# Patient Record
Sex: Male | Born: 1945 | Race: White | Hispanic: No | Marital: Married | Smoking: Former smoker
Health system: Southern US, Community
[De-identification: ages and names within clinical notes are randomized; demographics above are authoritative.]

## PROBLEM LIST (undated history)

## (undated) DIAGNOSIS — M1990 Unspecified osteoarthritis, unspecified site: Secondary | ICD-10-CM

## (undated) DIAGNOSIS — I10 Essential (primary) hypertension: Secondary | ICD-10-CM

## (undated) DIAGNOSIS — J449 Chronic obstructive pulmonary disease, unspecified: Secondary | ICD-10-CM

---

## 1998-11-11 HISTORY — PX: KNEE ARTHROSCOPY: SUR90

## 2004-11-11 HISTORY — PX: KNEE ARTHROSCOPY: SUR90

## 2011-04-05 DIAGNOSIS — R072 Precordial pain: Secondary | ICD-10-CM

## 2012-01-09 DIAGNOSIS — I2510 Atherosclerotic heart disease of native coronary artery without angina pectoris: Secondary | ICD-10-CM | POA: Diagnosis not present

## 2012-01-09 DIAGNOSIS — F411 Generalized anxiety disorder: Secondary | ICD-10-CM | POA: Diagnosis not present

## 2012-01-09 DIAGNOSIS — I10 Essential (primary) hypertension: Secondary | ICD-10-CM | POA: Diagnosis not present

## 2012-04-07 DIAGNOSIS — I10 Essential (primary) hypertension: Secondary | ICD-10-CM | POA: Diagnosis not present

## 2012-07-07 DIAGNOSIS — L723 Sebaceous cyst: Secondary | ICD-10-CM | POA: Diagnosis not present

## 2012-10-07 DIAGNOSIS — I10 Essential (primary) hypertension: Secondary | ICD-10-CM | POA: Diagnosis not present

## 2012-10-07 DIAGNOSIS — Z13220 Encounter for screening for lipoid disorders: Secondary | ICD-10-CM | POA: Diagnosis not present

## 2013-01-07 DIAGNOSIS — I10 Essential (primary) hypertension: Secondary | ICD-10-CM | POA: Diagnosis not present

## 2013-04-06 DIAGNOSIS — I10 Essential (primary) hypertension: Secondary | ICD-10-CM | POA: Diagnosis not present

## 2013-04-06 DIAGNOSIS — Z125 Encounter for screening for malignant neoplasm of prostate: Secondary | ICD-10-CM | POA: Diagnosis not present

## 2013-04-16 DIAGNOSIS — Z7982 Long term (current) use of aspirin: Secondary | ICD-10-CM | POA: Diagnosis not present

## 2013-04-16 DIAGNOSIS — Z2821 Immunization not carried out because of patient refusal: Secondary | ICD-10-CM | POA: Diagnosis not present

## 2013-04-16 DIAGNOSIS — Z800 Family history of malignant neoplasm of digestive organs: Secondary | ICD-10-CM | POA: Diagnosis not present

## 2013-04-16 DIAGNOSIS — I2510 Atherosclerotic heart disease of native coronary artery without angina pectoris: Secondary | ICD-10-CM | POA: Diagnosis not present

## 2013-04-16 DIAGNOSIS — J36 Peritonsillar abscess: Secondary | ICD-10-CM | POA: Diagnosis not present

## 2013-04-16 DIAGNOSIS — F17200 Nicotine dependence, unspecified, uncomplicated: Secondary | ICD-10-CM | POA: Diagnosis not present

## 2013-04-16 DIAGNOSIS — Z801 Family history of malignant neoplasm of trachea, bronchus and lung: Secondary | ICD-10-CM | POA: Diagnosis not present

## 2013-04-16 DIAGNOSIS — Z79899 Other long term (current) drug therapy: Secondary | ICD-10-CM | POA: Diagnosis not present

## 2013-04-16 DIAGNOSIS — I10 Essential (primary) hypertension: Secondary | ICD-10-CM | POA: Diagnosis not present

## 2013-05-03 DIAGNOSIS — J36 Peritonsillar abscess: Secondary | ICD-10-CM | POA: Diagnosis not present

## 2013-05-03 DIAGNOSIS — I10 Essential (primary) hypertension: Secondary | ICD-10-CM | POA: Diagnosis not present

## 2013-05-26 DIAGNOSIS — H5210 Myopia, unspecified eye: Secondary | ICD-10-CM | POA: Diagnosis not present

## 2013-05-26 DIAGNOSIS — H52229 Regular astigmatism, unspecified eye: Secondary | ICD-10-CM | POA: Diagnosis not present

## 2013-05-26 DIAGNOSIS — H2510 Age-related nuclear cataract, unspecified eye: Secondary | ICD-10-CM | POA: Diagnosis not present

## 2013-07-23 DIAGNOSIS — Z0000 Encounter for general adult medical examination without abnormal findings: Secondary | ICD-10-CM | POA: Diagnosis not present

## 2013-07-23 DIAGNOSIS — I10 Essential (primary) hypertension: Secondary | ICD-10-CM | POA: Diagnosis not present

## 2013-10-26 DIAGNOSIS — Z131 Encounter for screening for diabetes mellitus: Secondary | ICD-10-CM | POA: Diagnosis not present

## 2013-10-26 DIAGNOSIS — I10 Essential (primary) hypertension: Secondary | ICD-10-CM | POA: Diagnosis not present

## 2013-10-28 DIAGNOSIS — Z23 Encounter for immunization: Secondary | ICD-10-CM | POA: Diagnosis not present

## 2013-12-08 DIAGNOSIS — H2510 Age-related nuclear cataract, unspecified eye: Secondary | ICD-10-CM | POA: Diagnosis not present

## 2014-02-09 DIAGNOSIS — I10 Essential (primary) hypertension: Secondary | ICD-10-CM | POA: Diagnosis not present

## 2014-05-17 DIAGNOSIS — I10 Essential (primary) hypertension: Secondary | ICD-10-CM | POA: Diagnosis not present

## 2014-08-16 DIAGNOSIS — I10 Essential (primary) hypertension: Secondary | ICD-10-CM | POA: Diagnosis not present

## 2014-08-16 DIAGNOSIS — Z0000 Encounter for general adult medical examination without abnormal findings: Secondary | ICD-10-CM | POA: Diagnosis not present

## 2014-08-16 DIAGNOSIS — Z1389 Encounter for screening for other disorder: Secondary | ICD-10-CM | POA: Diagnosis not present

## 2014-08-16 DIAGNOSIS — Z125 Encounter for screening for malignant neoplasm of prostate: Secondary | ICD-10-CM | POA: Diagnosis not present

## 2014-08-20 DIAGNOSIS — Z23 Encounter for immunization: Secondary | ICD-10-CM | POA: Diagnosis not present

## 2014-12-19 DIAGNOSIS — I10 Essential (primary) hypertension: Secondary | ICD-10-CM | POA: Diagnosis not present

## 2014-12-19 DIAGNOSIS — Z13220 Encounter for screening for lipoid disorders: Secondary | ICD-10-CM | POA: Diagnosis not present

## 2015-03-20 DIAGNOSIS — I10 Essential (primary) hypertension: Secondary | ICD-10-CM | POA: Diagnosis not present

## 2015-03-30 DIAGNOSIS — H524 Presbyopia: Secondary | ICD-10-CM | POA: Diagnosis not present

## 2015-03-30 DIAGNOSIS — H52223 Regular astigmatism, bilateral: Secondary | ICD-10-CM | POA: Diagnosis not present

## 2015-03-30 DIAGNOSIS — H2510 Age-related nuclear cataract, unspecified eye: Secondary | ICD-10-CM | POA: Diagnosis not present

## 2015-03-30 DIAGNOSIS — H5213 Myopia, bilateral: Secondary | ICD-10-CM | POA: Diagnosis not present

## 2015-06-12 DIAGNOSIS — H2512 Age-related nuclear cataract, left eye: Secondary | ICD-10-CM | POA: Diagnosis not present

## 2015-06-12 DIAGNOSIS — H2513 Age-related nuclear cataract, bilateral: Secondary | ICD-10-CM | POA: Diagnosis not present

## 2015-06-20 DIAGNOSIS — I10 Essential (primary) hypertension: Secondary | ICD-10-CM | POA: Diagnosis not present

## 2015-06-21 DIAGNOSIS — I10 Essential (primary) hypertension: Secondary | ICD-10-CM | POA: Diagnosis not present

## 2015-06-27 NOTE — Patient Instructions (Signed)
David Mack  06/27/2015     @   Your procedure is scheduled on 07/06/2015  Report to [facility] at 7:00 A.M.  Call this number if you have problems the morning of surgery:  [phone]   Remember:  Do not eat food or drink liquids after midnight.  Take these medicines the morning of surgery with A SIP OF WATER Imdur, Amlodipine   Do not wear jewelry, make-up or nail polish.  Do not wear lotions, powders, or perfumes.  You may wear deodorant.  Do not shave 48 hours prior to surgery.  Men may shave face and neck.  Do not bring valuables to the hospital.  [facility] is not responsible for any belongings or valuables.  Contacts, dentures or bridgework may not be worn into surgery.  Leave your suitcase in the car.  After surgery it may be brought to your room.  For patients admitted to the hospital, discharge time will be determined by your treatment team.  Patients discharged the day of surgery will not be allowed to drive home.   Please read over the following fact sheets that you were given. Anesthesia Post-op Instructions      PATIENT INSTRUCTIONS POST-ANESTHESIA  IMMEDIATELY FOLLOWING SURGERY:  Do not drive or operate machinery for the first twenty four hours after surgery.  Do not make any important decisions for twenty four hours after surgery or while taking narcotic pain medications or sedatives.  If you develop intractable nausea and vomiting or a severe headache please notify your doctor immediately.  FOLLOW-UP:  Please make an appointment with your surgeon as instructed. You do not need to follow up with anesthesia unless specifically instructed to do so.  WOUND CARE INSTRUCTIONS (if applicable):  Keep a dry clean dressing on the anesthesia/puncture wound site if there is drainage.  Once the wound has quit draining you may leave it open to air.  Generally you should leave the bandage intact for twenty four hours unless there is drainage.  If the  epidural site drains for more than 36-48 hours please call the anesthesia department.  QUESTIONS?:  Please feel free to call your physician or the hospital operator if you have any questions, and they will be happy to assist you.      Cataract Surgery  A cataract is a clouding of the lens of the eye. When a lens becomes cloudy, vision is reduced based on the degree and nature of the clouding. Surgery may be needed to improve vision. Surgery removes the cloudy lens and usually replaces it with a substitute lens (intraocular lens, IOL). LET YOUR EYE DOCTOR KNOW ABOUT:  Allergies to food or medicine.  Medicines taken including herbs, eye drops, over-the-counter medicines, and creams.  Use of steroids (by mouth or creams).  Previous problems with anesthetics or numbing medicine.  History of bleeding problems or blood clots.  Previous surgery.  Other health problems, including diabetes and kidney problems.  Possibility of pregnancy, if this applies. RISKS AND COMPLICATIONS  Infection.  Inflammation of the eyeball (endophthalmitis) that can spread to both eyes (sympathetic ophthalmia).  Poor wound healing.  If an IOL is inserted, it can later fall out of proper position. This is very uncommon.  Clouding of the part of your eye that holds an IOL in place. This is called an "after-cataract." These are uncommon but easily treated. BEFORE THE PROCEDURE  Do not eat or drink anything except small amounts of water for 8 to 12 before  your surgery, or as directed by your caregiver.  Unless you are told otherwise, continue any eye drops you have been prescribed.  Talk to your primary caregiver about all other medicines that you take (both prescription and nonprescription). In some cases, you may need to stop or change medicines near the time of your surgery. This is most important if you are taking blood-thinning medicine.Do not stop medicines unless you are told to do so.  Arrange for  someone to drive you to and from the procedure.  Do not put contact lenses in either eye on the day of your surgery. PROCEDURE There is more than one method for safely removing a cataract. Your doctor can explain the differences and help determine which is best for you. Phacoemulsification surgery is the most common form of cataract surgery.  An injection is given behind the eye or eye drops are given to make this a painless procedure.  A small cut (incision) is made on the edge of the clear, dome-shaped surface that covers the front of the eye (cornea).  A tiny probe is painlessly inserted into the eye. This device gives off ultrasound waves that soften and break up the cloudy center of the lens. This makes it easier for the cloudy lens to be removed by suction.  An IOL may be implanted.  The normal lens of the eye is covered by a clear capsule. Part of that capsule is intentionally left in the eye to support the IOL.  Your surgeon may or may not use stitches to close the incision. There are other forms of cataract surgery that require a larger incision and stitches to close the eye. This approach is taken in cases where the doctor feels that the cataract cannot be easily removed using phacoemulsification. AFTER THE PROCEDURE  When an IOL is implanted, it does not need care. It becomes a permanent part of your eye and cannot be seen or felt.  Your doctor will schedule follow-up exams to check on your progress.  Review your other medicines with your doctor to see which can be resumed after surgery.  Use eye drops or take medicine as prescribed by your doctor. Document Released: 10/17/2011 Document Revised: 03/14/2014 Document Reviewed: 10/17/2011 [facility] Patient Information 2015 [place], [place]. This information is not intended to replace advice given to you by your health care provider. Make sure you discuss any questions you have with your health care provider.

## 2015-06-28 ENCOUNTER — Encounter (HOSPITAL_COMMUNITY)
Admission: RE | Admit: 2015-06-28 | Discharge: 2015-06-28 | Disposition: A | Discharge status: Home / Self Care | Payer: Medicare Other | Source: Ambulatory Visit | Attending: Ophthalmology | Admitting: Ophthalmology

## 2015-06-28 ENCOUNTER — Encounter (HOSPITAL_COMMUNITY): Payer: Self-pay

## 2015-06-28 ENCOUNTER — Other Ambulatory Visit: Payer: Self-pay

## 2015-06-28 DIAGNOSIS — H2512 Age-related nuclear cataract, left eye: Secondary | ICD-10-CM | POA: Insufficient documentation

## 2015-06-28 DIAGNOSIS — Z01818 Encounter for other preprocedural examination: Secondary | ICD-10-CM | POA: Diagnosis not present

## 2015-06-28 HISTORY — DX: Essential (primary) hypertension: I10

## 2015-06-28 LAB — CBC
HEMATOCRIT: 39.7 % (ref 39.0–52.0)
Hemoglobin: 14 g/dL (ref 13.0–17.0)
MCH: 33.7 pg (ref 26.0–34.0)
MCHC: 35.3 g/dL (ref 30.0–36.0)
MCV: 95.4 fL (ref 78.0–100.0)
PLATELETS: 249 10*3/uL (ref 150–400)
RBC: 4.16 MIL/uL — ABNORMAL LOW (ref 4.22–5.81)
RDW: 13 % (ref 11.5–15.5)
WBC: 6.3 10*3/uL (ref 4.0–10.5)

## 2015-06-28 LAB — BASIC METABOLIC PANEL
Anion gap: 9 (ref 5–15)
BUN: 7 mg/dL (ref 6–20)
CALCIUM: 9.3 mg/dL (ref 8.9–10.3)
CO2: 26 mmol/L (ref 22–32)
CREATININE: 0.68 mg/dL (ref 0.61–1.24)
Chloride: 97 mmol/L — ABNORMAL LOW (ref 101–111)
GLUCOSE: 100 mg/dL — AB (ref 65–99)
Potassium: 5.2 mmol/L — ABNORMAL HIGH (ref 3.5–5.1)
Sodium: 132 mmol/L — ABNORMAL LOW (ref 135–145)

## 2015-07-05 MED ORDER — CYCLOPENTOLATE-PHENYLEPHRINE OP SOLN OPTIME - NO CHARGE
OPHTHALMIC | Status: AC
  Filled 2015-07-05: qty 2

## 2015-07-05 MED ORDER — TETRACAINE HCL 0.5 % OP SOLN
OPHTHALMIC | Status: AC
  Filled 2015-07-05: qty 2

## 2015-07-05 MED ORDER — LIDOCAINE HCL (PF) 1 % IJ SOLN
INTRAMUSCULAR | Status: AC
  Filled 2015-07-05: qty 2

## 2015-07-05 MED ORDER — LIDOCAINE HCL 3.5 % OP GEL
OPHTHALMIC | Status: AC
  Filled 2015-07-05: qty 1

## 2015-07-05 MED ORDER — NEOMYCIN-POLYMYXIN-DEXAMETH 3.5-10000-0.1 OP SUSP
OPHTHALMIC | Status: AC
  Filled 2015-07-05: qty 5

## 2015-07-05 MED ORDER — PHENYLEPHRINE HCL 2.5 % OP SOLN
OPHTHALMIC | Status: AC
  Filled 2015-07-05: qty 15

## 2015-07-06 ENCOUNTER — Ambulatory Visit (HOSPITAL_COMMUNITY): Payer: Medicare Other | Admitting: Anesthesiology

## 2015-07-06 ENCOUNTER — Ambulatory Visit (HOSPITAL_COMMUNITY)
Admission: RE | Admit: 2015-07-06 | Discharge: 2015-07-06 | Disposition: A | Discharge status: Home / Self Care | Payer: Medicare Other | Source: Ambulatory Visit | Attending: Ophthalmology | Admitting: Ophthalmology

## 2015-07-06 ENCOUNTER — Encounter (HOSPITAL_COMMUNITY)
Admission: RE | Disposition: A | Discharge status: Home / Self Care | Payer: Self-pay | Source: Ambulatory Visit | Attending: Ophthalmology

## 2015-07-06 ENCOUNTER — Encounter (HOSPITAL_COMMUNITY): Payer: Self-pay | Admitting: Ophthalmology

## 2015-07-06 DIAGNOSIS — H269 Unspecified cataract: Secondary | ICD-10-CM | POA: Diagnosis not present

## 2015-07-06 DIAGNOSIS — Z7982 Long term (current) use of aspirin: Secondary | ICD-10-CM | POA: Insufficient documentation

## 2015-07-06 DIAGNOSIS — F17200 Nicotine dependence, unspecified, uncomplicated: Secondary | ICD-10-CM | POA: Diagnosis not present

## 2015-07-06 DIAGNOSIS — H25812 Combined forms of age-related cataract, left eye: Secondary | ICD-10-CM | POA: Diagnosis not present

## 2015-07-06 DIAGNOSIS — H2512 Age-related nuclear cataract, left eye: Secondary | ICD-10-CM | POA: Insufficient documentation

## 2015-07-06 DIAGNOSIS — I10 Essential (primary) hypertension: Secondary | ICD-10-CM | POA: Diagnosis not present

## 2015-07-06 DIAGNOSIS — Z79899 Other long term (current) drug therapy: Secondary | ICD-10-CM | POA: Insufficient documentation

## 2015-07-06 HISTORY — PX: CATARACT EXTRACTION W/PHACO: SHX586

## 2015-07-06 SURGERY — PHACOEMULSIFICATION, CATARACT, WITH IOL INSERTION
Anesthesia: Monitor Anesthesia Care | Site: Eye | Laterality: Left

## 2015-07-06 MED ORDER — FENTANYL CITRATE (PF) 100 MCG/2ML IJ SOLN
25.0000 ug | INTRAMUSCULAR | Status: AC
  Administered 2015-07-06: 100 ug via INTRAVENOUS
  Administered 2015-07-06: 25 ug via INTRAVENOUS

## 2015-07-06 MED ORDER — LIDOCAINE HCL 3.5 % OP GEL
1.0000 "application " | OPHTHALMIC | Status: AC
  Administered 2015-07-06: 1 via OPHTHALMIC

## 2015-07-06 MED ORDER — PROVISC 10 MG/ML IO SOLN
INTRAOCULAR | Status: DC | PRN
  Administered 2015-07-06: 0.85 mL via INTRAOCULAR

## 2015-07-06 MED ORDER — BSS IO SOLN
INTRAOCULAR | Status: DC | PRN
  Administered 2015-07-06: 15 mL

## 2015-07-06 MED ORDER — POVIDONE-IODINE 5 % OP SOLN
OPHTHALMIC | Status: DC | PRN
  Administered 2015-07-06: 1 via OPHTHALMIC

## 2015-07-06 MED ORDER — TETRACAINE HCL 0.5 % OP SOLN
1.0000 [drp] | OPHTHALMIC | Status: AC
  Administered 2015-07-06 (×3): 1 [drp] via OPHTHALMIC

## 2015-07-06 MED ORDER — LIDOCAINE HCL (PF) 1 % IJ SOLN
INTRAMUSCULAR | Status: DC | PRN
  Administered 2015-07-06: .8 mL

## 2015-07-06 MED ORDER — MIDAZOLAM HCL 2 MG/2ML IJ SOLN
INTRAMUSCULAR | Status: DC | PRN
  Administered 2015-07-06: 2 mg via INTRAVENOUS

## 2015-07-06 MED ORDER — LIDOCAINE 3.5 % OP GEL OPTIME - NO CHARGE
OPHTHALMIC | Status: DC | PRN
  Administered 2015-07-06: 1 [drp] via OPHTHALMIC

## 2015-07-06 MED ORDER — LACTATED RINGERS IV SOLN
INTRAVENOUS | Status: DC
  Administered 2015-07-06: 08:00:00 via INTRAVENOUS

## 2015-07-06 MED ORDER — EPINEPHRINE HCL 1 MG/ML IJ SOLN
INTRAOCULAR | Status: DC | PRN
  Administered 2015-07-06: 500 mL

## 2015-07-06 MED ORDER — NEOMYCIN-POLYMYXIN-DEXAMETH 3.5-10000-0.1 OP SUSP
OPHTHALMIC | Status: DC | PRN
  Administered 2015-07-06: 2 [drp] via OPHTHALMIC

## 2015-07-06 MED ORDER — PHENYLEPHRINE HCL 2.5 % OP SOLN
1.0000 [drp] | OPHTHALMIC | Status: AC
  Administered 2015-07-06 (×3): 1 [drp] via OPHTHALMIC

## 2015-07-06 MED ORDER — MIDAZOLAM HCL 2 MG/2ML IJ SOLN
INTRAMUSCULAR | Status: AC
  Filled 2015-07-06: qty 2

## 2015-07-06 MED ORDER — MIDAZOLAM HCL 2 MG/2ML IJ SOLN
1.0000 mg | INTRAMUSCULAR | Status: DC | PRN
  Administered 2015-07-06 (×2): 2 mg via INTRAVENOUS
  Filled 2015-07-06: qty 2

## 2015-07-06 MED ORDER — MIDAZOLAM HCL 2 MG/2ML IJ SOLN
INTRAMUSCULAR | Status: AC
  Filled 2015-07-06: qty 4

## 2015-07-06 MED ORDER — EPINEPHRINE HCL 1 MG/ML IJ SOLN
INTRAMUSCULAR | Status: AC
  Filled 2015-07-06: qty 1

## 2015-07-06 MED ORDER — CYCLOPENTOLATE-PHENYLEPHRINE 0.2-1 % OP SOLN
1.0000 [drp] | OPHTHALMIC | Status: AC
  Administered 2015-07-06 (×3): 1 [drp] via OPHTHALMIC

## 2015-07-06 MED ORDER — FENTANYL CITRATE (PF) 100 MCG/2ML IJ SOLN
INTRAMUSCULAR | Status: AC
  Filled 2015-07-06: qty 2

## 2015-07-06 SURGICAL SUPPLY — 34 items
CAPSULAR TENSION RING-AMO (OPHTHALMIC RELATED) IMPLANT
CLOTH BEACON ORANGE TIMEOUT ST (SAFETY) IMPLANT
EYE SHIELD UNIVERSAL CLEAR (GAUZE/BANDAGES/DRESSINGS) ×3 IMPLANT
GLOVE BIO SURGEON STRL SZ 6.5 (GLOVE) IMPLANT
GLOVE BIO SURGEONS STRL SZ 6.5 (GLOVE)
GLOVE BIOGEL PI IND STRL 6.5 (GLOVE) IMPLANT
GLOVE BIOGEL PI IND STRL 7.0 (GLOVE) ×1 IMPLANT
GLOVE BIOGEL PI IND STRL 7.5 (GLOVE) ×1 IMPLANT
GLOVE BIOGEL PI INDICATOR 6.5 (GLOVE)
GLOVE BIOGEL PI INDICATOR 7.0 (GLOVE) ×2
GLOVE BIOGEL PI INDICATOR 7.5 (GLOVE) ×2
GLOVE ECLIPSE 6.5 STRL STRAW (GLOVE) IMPLANT
GLOVE ECLIPSE 7.0 STRL STRAW (GLOVE) IMPLANT
GLOVE ECLIPSE 7.5 STRL STRAW (GLOVE) IMPLANT
GLOVE EXAM NITRILE LRG STRL (GLOVE) IMPLANT
GLOVE EXAM NITRILE MD LF STRL (GLOVE) IMPLANT
GLOVE SKINSENSE NS SZ6.5 (GLOVE)
GLOVE SKINSENSE NS SZ7.0 (GLOVE)
GLOVE SKINSENSE STRL SZ6.5 (GLOVE) IMPLANT
GLOVE SKINSENSE STRL SZ7.0 (GLOVE) IMPLANT
KIT VITRECTOMY (OPHTHALMIC RELATED) IMPLANT
PAD ARMBOARD 7.5X6 YLW CONV (MISCELLANEOUS) IMPLANT
PROC W NO LENS (INTRAOCULAR LENS)
PROC W SPEC LENS (INTRAOCULAR LENS)
PROCESS W NO LENS (INTRAOCULAR LENS) IMPLANT
PROCESS W SPEC LENS (INTRAOCULAR LENS) IMPLANT
RETRACTOR IRIS SIGHTPATH (OPHTHALMIC RELATED) IMPLANT
RING MALYGIN (MISCELLANEOUS) IMPLANT
SIGHTPATH CAT PROC W REG LENS (Ophthalmic Related) ×3 IMPLANT
SYRINGE LUER LOK 1CC (MISCELLANEOUS) ×3 IMPLANT
TAPE SURG TRANSPORE 1 IN (GAUZE/BANDAGES/DRESSINGS) ×1 IMPLANT
TAPE SURGICAL TRANSPORE 1 IN (GAUZE/BANDAGES/DRESSINGS) ×2
VISCOELASTIC ADDITIONAL (OPHTHALMIC RELATED) IMPLANT
WATER STERILE IRR 250ML POUR (IV SOLUTION) ×3 IMPLANT

## 2015-07-06 NOTE — H&P (Signed)
I have reviewed the H&P, the patient was re-examined, and I have identified no interval changes in medical condition and plan of care since the history and physical of record  

## 2015-07-06 NOTE — Transfer of Care (Signed)
Immediate Anesthesia Transfer of Care Note  Patient: David Mack  Procedure(s) Performed: Procedure(s) with comments: CATARACT EXTRACTION PHACO AND INTRAOCULAR LENS PLACEMENT (IOC) (Left) - CDE 8.33  Patient Location: Short Stay  Anesthesia Type:MAC  Level of Consciousness: awake  Airway & Oxygen Therapy: Patient Spontanous Breathing  Post-op Assessment: Report given to RN  Post vital signs: Reviewed  Last Vitals:  Filed Vitals:   07/06/15 0900  BP: 115/63  Resp: 37    Complications: No apparent anesthesia complications

## 2015-07-06 NOTE — Op Note (Signed)
Date of Admission: 07/06/2015  Date of Surgery: 07/06/2015   Pre-Op Dx: Cataract Left Eye  Post-Op Dx: Senile Nuclear Cataract Left  Eye,  Dx Code H25.12  Surgeon: Gemma Payor, M.D.  Assistants: None  Anesthesia: Topical with MAC  Indications: Painless, progressive loss of vision with compromise of daily activities.  Surgery: Cataract Extraction with Intraocular lens Implant Left Eye  Discription: The patient had dilating drops and viscous lidocaine placed into the Left eye in the pre-op holding area. After transfer to the operating room, a time out was performed. The patient was then prepped and draped. Beginning with a 75 degree blade a paracentesis port was made at the surgeon's 2 o'clock position. The anterior chamber was then filled with 1% non-preserved lidocaine. This was followed by filling the anterior chamber with Provisc.  A 2.54mm keratome blade was used to make a clear corneal incision at the temporal limbus.  A bent cystatome needle was used to create a continuous tear capsulotomy. Hydrodissection was performed with balanced salt solution on a Fine canula. The lens nucleus was then removed using the phacoemulsification handpiece. Residual cortex was removed with the I&A handpiece. The anterior chamber and capsular bag were refilled with Provisc. A posterior chamber intraocular lens was placed into the capsular bag with it's injector. The implant was positioned with the Kuglan hook. The Provisc was then removed from the anterior chamber and capsular bag with the I&A handpiece. Stromal hydration of the main incision and paracentesis port was performed with BSS on a Fine canula. The wounds were tested for leak which was negative. The patient tolerated the procedure well. There were no operative complications. The patient was then transferred to the recovery room in stable condition.  Complications: None  Specimen: None  EBL: None  Prosthetic device: Hoya iSert 250, power 20.5 D, SN  [id number].

## 2015-07-06 NOTE — Anesthesia Postprocedure Evaluation (Signed)
  Anesthesia Post-op Note  Patient: David Mack  Procedure(s) Performed: Procedure(s) with comments: CATARACT EXTRACTION PHACO AND INTRAOCULAR LENS PLACEMENT (IOC) (Left) - CDE 8.33  Patient Location: Short Stay  Anesthesia Type:MAC  Level of Consciousness: awake, alert  and oriented  Airway and Oxygen Therapy: Patient Spontanous Breathing  Post-op Pain: none  Post-op Assessment: Post-op Vital signs reviewed, Patient's Cardiovascular Status Stable, Respiratory Function Stable, Patent Airway and No signs of Nausea or vomiting              Post-op Vital Signs: Reviewed and stable  Last Vitals:  Filed Vitals:   07/06/15 0900  BP: 115/63  Resp: 37    Complications: No apparent anesthesia complications

## 2015-07-06 NOTE — Discharge Instructions (Signed)

## 2015-07-06 NOTE — Anesthesia Preprocedure Evaluation (Signed)
Anesthesia Evaluation  Patient identified by MRN, date of birth, ID band Patient awake    Reviewed: Allergy & Precautions, NPO status , Patient's Chart, lab work & pertinent test results  Airway Mallampati: II  TM Distance: >3 FB     Dental  (+) Teeth Intact   Pulmonary Current Smoker,    breath sounds clear to auscultation       Cardiovascular hypertension, Pt. on medications  Rhythm:Regular Rate:Normal     Neuro/Psych    GI/Hepatic negative GI ROS,   Endo/Other    Renal/GU      Musculoskeletal   Abdominal   Peds  Hematology   Anesthesia Other Findings   Reproductive/Obstetrics                             Anesthesia Physical Anesthesia Plan  ASA: II  Anesthesia Plan: MAC   Post-op Pain Management:    Induction: Intravenous  Airway Management Planned: Nasal Cannula  Additional Equipment:   Intra-op Plan:   Post-operative Plan:   Informed Consent: I have reviewed the patients History and Physical, chart, labs and discussed the procedure including the risks, benefits and alternatives for the proposed anesthesia with the patient or authorized representative who has indicated his/her understanding and acceptance.     Plan Discussed with:   Anesthesia Plan Comments:         Anesthesia Quick Evaluation  

## 2015-07-07 ENCOUNTER — Encounter (HOSPITAL_COMMUNITY): Payer: Self-pay | Admitting: Ophthalmology

## 2015-08-14 DIAGNOSIS — H2511 Age-related nuclear cataract, right eye: Secondary | ICD-10-CM | POA: Diagnosis not present

## 2015-08-15 NOTE — Patient Instructions (Signed)
DEVARIOUS PAVEK  08/15/2015     @   Your procedure is scheduled on 08/21/2015.  Report to [facility] at 11:30 A.M.  Call this number if you have problems the morning of surgery:  [phone]   Remember:  Do not eat food or drink liquids after midnight.  Take these medicines the morning of surgery with A SIP OF WATER Amlodipine, Imdur   Do not wear jewelry, make-up or nail polish.  Do not wear lotions, powders, or perfumes.  You may wear deodorant.  Do not shave 48 hours prior to surgery.  Men may shave face and neck.  Do not bring valuables to the hospital.  [facility] is not responsible for any belongings or valuables.  Contacts, dentures or bridgework may not be worn into surgery.  Leave your suitcase in the car.  After surgery it may be brought to your room.  For patients admitted to the hospital, discharge time will be determined by your treatment team.  Patients discharged the day of surgery will not be allowed to drive home.    Please read over the following fact sheets that you were given. Anesthesia Post-op Instructions     PATIENT INSTRUCTIONS POST-ANESTHESIA  IMMEDIATELY FOLLOWING SURGERY:  Do not drive or operate machinery for the first twenty four hours after surgery.  Do not make any important decisions for twenty four hours after surgery or while taking narcotic pain medications or sedatives.  If you develop intractable nausea and vomiting or a severe headache please notify your doctor immediately.  FOLLOW-UP:  Please make an appointment with your surgeon as instructed. You do not need to follow up with anesthesia unless specifically instructed to do so.  WOUND CARE INSTRUCTIONS (if applicable):  Keep a dry clean dressing on the anesthesia/puncture wound site if there is drainage.  Once the wound has quit draining you may leave it open to air.  Generally you should leave the bandage intact for twenty four hours unless there is drainage.  If  the epidural site drains for more than 36-48 hours please call the anesthesia department.  QUESTIONS?:  Please feel free to call your physician or the hospital operator if you have any questions, and they will be happy to assist you.      Cataract Surgery  A cataract is a clouding of the lens of the eye. When a lens becomes cloudy, vision is reduced based on the degree and nature of the clouding. Surgery may be needed to improve vision. Surgery removes the cloudy lens and usually replaces it with a substitute lens (intraocular lens, IOL). LET YOUR EYE DOCTOR KNOW ABOUT:  Allergies to food or medicine.  Medicines taken including herbs, eye drops, over-the-counter medicines, and creams.  Use of steroids (by mouth or creams).  Previous problems with anesthetics or numbing medicine.  History of bleeding problems or blood clots.  Previous surgery.  Other health problems, including diabetes and kidney problems.  Possibility of pregnancy, if this applies. RISKS AND COMPLICATIONS  Infection.  Inflammation of the eyeball (endophthalmitis) that can spread to both eyes (sympathetic ophthalmia).  Poor wound healing.  If an IOL is inserted, it can later fall out of proper position. This is very uncommon.  Clouding of the part of your eye that holds an IOL in place. This is called an "after-cataract." These are uncommon but easily treated. BEFORE THE PROCEDURE  Do not eat or drink anything except small amounts of water for 8 to 12 before  your surgery, or as directed by your caregiver.  Unless you are told otherwise, continue any eye drops you have been prescribed.  Talk to your primary caregiver about all other medicines that you take (both prescription and nonprescription). In some cases, you may need to stop or change medicines near the time of your surgery. This is most important if you are taking blood-thinning medicine.Do not stop medicines unless you are told to do so.  Arrange  for someone to drive you to and from the procedure.  Do not put contact lenses in either eye on the day of your surgery. PROCEDURE There is more than one method for safely removing a cataract. Your doctor can explain the differences and help determine which is best for you. Phacoemulsification surgery is the most common form of cataract surgery.  An injection is given behind the eye or eye drops are given to make this a painless procedure.  A small cut (incision) is made on the edge of the clear, dome-shaped surface that covers the front of the eye (cornea).  A tiny probe is painlessly inserted into the eye. This device gives off ultrasound waves that soften and break up the cloudy center of the lens. This makes it easier for the cloudy lens to be removed by suction.  An IOL may be implanted.  The normal lens of the eye is covered by a clear capsule. Part of that capsule is intentionally left in the eye to support the IOL.  Your surgeon may or may not use stitches to close the incision. There are other forms of cataract surgery that require a larger incision and stitches to close the eye. This approach is taken in cases where the doctor feels that the cataract cannot be easily removed using phacoemulsification. AFTER THE PROCEDURE  When an IOL is implanted, it does not need care. It becomes a permanent part of your eye and cannot be seen or felt.  Your doctor will schedule follow-up exams to check on your progress.  Review your other medicines with your doctor to see which can be resumed after surgery.  Use eye drops or take medicine as prescribed by your doctor. Document Released: 10/17/2011 Document Revised: 03/14/2014 Document Reviewed: 10/17/2011 [facility] Patient Information 2015 [place], [place]. This information is not intended to replace advice given to you by your health care provider. Make sure you discuss any questions you have with your health care provider.

## 2015-08-17 ENCOUNTER — Encounter (HOSPITAL_COMMUNITY): Payer: Self-pay

## 2015-08-17 ENCOUNTER — Encounter (HOSPITAL_COMMUNITY)
Admission: RE | Admit: 2015-08-17 | Discharge: 2015-08-17 | Disposition: A | Discharge status: Home / Self Care | Payer: Medicare Other | Source: Ambulatory Visit | Attending: Ophthalmology | Admitting: Ophthalmology

## 2015-08-18 MED ORDER — PHENYLEPHRINE HCL 2.5 % OP SOLN
OPHTHALMIC | Status: AC
  Filled 2015-08-18: qty 15

## 2015-08-18 MED ORDER — TETRACAINE HCL 0.5 % OP SOLN
OPHTHALMIC | Status: AC
  Filled 2015-08-18: qty 2

## 2015-08-18 MED ORDER — LIDOCAINE HCL 3.5 % OP GEL
OPHTHALMIC | Status: AC
  Filled 2015-08-18: qty 1

## 2015-08-18 MED ORDER — CYCLOPENTOLATE-PHENYLEPHRINE OP SOLN OPTIME - NO CHARGE
OPHTHALMIC | Status: AC
  Filled 2015-08-18: qty 2

## 2015-08-18 MED ORDER — NEOMYCIN-POLYMYXIN-DEXAMETH 3.5-10000-0.1 OP SUSP
OPHTHALMIC | Status: AC
  Filled 2015-08-18: qty 5

## 2015-08-18 MED ORDER — LIDOCAINE HCL (PF) 1 % IJ SOLN
INTRAMUSCULAR | Status: AC
  Filled 2015-08-18: qty 2

## 2015-08-21 ENCOUNTER — Encounter (HOSPITAL_COMMUNITY): Payer: Self-pay | Admitting: *Deleted

## 2015-08-21 ENCOUNTER — Encounter (HOSPITAL_COMMUNITY)
Admission: RE | Disposition: A | Discharge status: Home / Self Care | Payer: Self-pay | Source: Ambulatory Visit | Attending: Ophthalmology

## 2015-08-21 ENCOUNTER — Ambulatory Visit (HOSPITAL_COMMUNITY): Payer: Medicare Other | Admitting: Anesthesiology

## 2015-08-21 ENCOUNTER — Ambulatory Visit (HOSPITAL_COMMUNITY)
Admission: RE | Admit: 2015-08-21 | Discharge: 2015-08-21 | Disposition: A | Discharge status: Home / Self Care | Payer: Medicare Other | Source: Ambulatory Visit | Attending: Ophthalmology | Admitting: Ophthalmology

## 2015-08-21 DIAGNOSIS — Z79899 Other long term (current) drug therapy: Secondary | ICD-10-CM | POA: Insufficient documentation

## 2015-08-21 DIAGNOSIS — Z7982 Long term (current) use of aspirin: Secondary | ICD-10-CM | POA: Diagnosis not present

## 2015-08-21 DIAGNOSIS — I10 Essential (primary) hypertension: Secondary | ICD-10-CM | POA: Diagnosis not present

## 2015-08-21 DIAGNOSIS — F17200 Nicotine dependence, unspecified, uncomplicated: Secondary | ICD-10-CM | POA: Diagnosis not present

## 2015-08-21 DIAGNOSIS — H2511 Age-related nuclear cataract, right eye: Secondary | ICD-10-CM | POA: Insufficient documentation

## 2015-08-21 DIAGNOSIS — H269 Unspecified cataract: Secondary | ICD-10-CM | POA: Diagnosis not present

## 2015-08-21 HISTORY — PX: CATARACT EXTRACTION W/PHACO: SHX586

## 2015-08-21 SURGERY — PHACOEMULSIFICATION, CATARACT, WITH IOL INSERTION
Anesthesia: Monitor Anesthesia Care | Site: Eye | Laterality: Right

## 2015-08-21 MED ORDER — TETRACAINE HCL 0.5 % OP SOLN
1.0000 [drp] | OPHTHALMIC | Status: AC
  Administered 2015-08-21 (×3): 1 [drp] via OPHTHALMIC

## 2015-08-21 MED ORDER — PHENYLEPHRINE HCL 2.5 % OP SOLN
1.0000 [drp] | OPHTHALMIC | Status: AC
  Administered 2015-08-21 (×3): 1 [drp] via OPHTHALMIC

## 2015-08-21 MED ORDER — EPINEPHRINE HCL 1 MG/ML IJ SOLN
INTRAMUSCULAR | Status: AC
  Filled 2015-08-21: qty 1

## 2015-08-21 MED ORDER — CYCLOPENTOLATE-PHENYLEPHRINE 0.2-1 % OP SOLN
1.0000 [drp] | OPHTHALMIC | Status: AC
  Administered 2015-08-21 (×3): 1 [drp] via OPHTHALMIC

## 2015-08-21 MED ORDER — POVIDONE-IODINE 5 % OP SOLN
OPHTHALMIC | Status: DC | PRN
  Administered 2015-08-21: 1 via OPHTHALMIC

## 2015-08-21 MED ORDER — ONDANSETRON HCL 4 MG/2ML IJ SOLN: 4.0000 mg | Freq: Once | INTRAMUSCULAR | Status: DC | PRN

## 2015-08-21 MED ORDER — LIDOCAINE 3.5 % OP GEL OPTIME - NO CHARGE
OPHTHALMIC | Status: DC | PRN
  Administered 2015-08-21: 1 [drp] via OPHTHALMIC

## 2015-08-21 MED ORDER — NEOMYCIN-POLYMYXIN-DEXAMETH 3.5-10000-0.1 OP SUSP
OPHTHALMIC | Status: DC | PRN
Start: 2015-08-21 — End: 2015-08-21
  Administered 2015-08-21: 1 [drp] via OPHTHALMIC

## 2015-08-21 MED ORDER — PROVISC 10 MG/ML IO SOLN
INTRAOCULAR | Status: DC | PRN
Start: 2015-08-21 — End: 2015-08-21
  Administered 2015-08-21: 0.85 mL via INTRAOCULAR

## 2015-08-21 MED ORDER — LACTATED RINGERS IV SOLN
INTRAVENOUS | Status: DC
  Administered 2015-08-21: 1000 mL via INTRAVENOUS

## 2015-08-21 MED ORDER — LIDOCAINE HCL (PF) 1 % IJ SOLN
INTRAMUSCULAR | Status: DC | PRN
  Administered 2015-08-21: .5 mL

## 2015-08-21 MED ORDER — LIDOCAINE HCL 3.5 % OP GEL
1.0000 "application " | Freq: Once | OPHTHALMIC | Status: AC
  Administered 2015-08-21: 1 via OPHTHALMIC

## 2015-08-21 MED ORDER — FENTANYL CITRATE (PF) 100 MCG/2ML IJ SOLN
25.0000 ug | INTRAMUSCULAR | Status: AC
  Administered 2015-08-21 (×2): 25 ug via INTRAVENOUS

## 2015-08-21 MED ORDER — FENTANYL CITRATE (PF) 100 MCG/2ML IJ SOLN: 25.0000 ug | INTRAMUSCULAR | Status: DC | PRN

## 2015-08-21 MED ORDER — MIDAZOLAM HCL 2 MG/2ML IJ SOLN
1.0000 mg | INTRAMUSCULAR | Status: DC | PRN
  Administered 2015-08-21 (×2): 2 mg via INTRAVENOUS
  Filled 2015-08-21: qty 2

## 2015-08-21 MED ORDER — MIDAZOLAM HCL 2 MG/2ML IJ SOLN
INTRAMUSCULAR | Status: AC
  Filled 2015-08-21: qty 2

## 2015-08-21 MED ORDER — FENTANYL CITRATE (PF) 100 MCG/2ML IJ SOLN
INTRAMUSCULAR | Status: AC
  Filled 2015-08-21: qty 2

## 2015-08-21 MED ORDER — EPINEPHRINE HCL 1 MG/ML IJ SOLN
INTRAOCULAR | Status: DC | PRN
  Administered 2015-08-21: 500 mL

## 2015-08-21 MED ORDER — BSS IO SOLN
INTRAOCULAR | Status: DC | PRN
  Administered 2015-08-21: 15 mL

## 2015-08-21 SURGICAL SUPPLY — 11 items

## 2015-08-21 NOTE — Addendum Note (Signed)
Addendum  created 08/21/15 1359 by Moshe Salisbury, CRNA   Modules edited: Anesthesia Responsible Staff

## 2015-08-21 NOTE — Transfer of Care (Signed)
Immediate Anesthesia Transfer of Care Note  Patient: David Mack  Procedure(s) Performed: Procedure(s) with comments: CATARACT EXTRACTION PHACO AND INTRAOCULAR LENS PLACEMENT (IOC) (Right) - CDE:9.89  Patient Location: Short Stay  Anesthesia Type:MAC  Level of Consciousness: awake  Airway & Oxygen Therapy: Patient Spontanous Breathing  Post-op Assessment: Report given to RN  Post vital signs: Reviewed  Last Vitals:  Filed Vitals:   08/21/15 1239  BP: 145/75  Pulse: 73  Temp: 36.5 C  Resp: 17    Complications: No apparent anesthesia complications

## 2015-08-21 NOTE — Anesthesia Postprocedure Evaluation (Signed)
  Anesthesia Post-op Note  Patient: David Mack  Procedure(s) Performed: Procedure(s) with comments: CATARACT EXTRACTION PHACO AND INTRAOCULAR LENS PLACEMENT (IOC) (Right) - CDE:9.89  Patient Location: Short Stay  Anesthesia Type:MAC  Level of Consciousness: awake, alert  and oriented  Airway and Oxygen Therapy: Patient Spontanous Breathing  Post-op Pain: none  Post-op Assessment: Post-op Vital signs reviewed, Patient's Cardiovascular Status Stable, Respiratory Function Stable, Patent Airway and No signs of Nausea or vomiting              Post-op Vital Signs: Reviewed and stable  Last Vitals:  Filed Vitals:   08/21/15 1239  BP: 145/75  Pulse: 73  Temp: 36.5 C  Resp: 17    Complications: No apparent anesthesia complications

## 2015-08-21 NOTE — H&P (Signed)
I have reviewed the H&P, the patient was re-examined, and I have identified no interval changes in medical condition and plan of care since the history and physical of record  

## 2015-08-21 NOTE — Anesthesia Preprocedure Evaluation (Signed)
Anesthesia Evaluation  Patient identified by MRN, date of birth, ID band Patient awake    Reviewed: Allergy & Precautions, NPO status , Patient's Chart, lab work & pertinent test results  Airway Mallampati: II  TM Distance: >3 FB     Dental  (+) Teeth Intact   Pulmonary Current Smoker,    breath sounds clear to auscultation       Cardiovascular hypertension, Pt. on medications  Rhythm:Regular Rate:Normal     Neuro/Psych    GI/Hepatic negative GI ROS,   Endo/Other    Renal/GU      Musculoskeletal   Abdominal   Peds  Hematology   Anesthesia Other Findings   Reproductive/Obstetrics                             Anesthesia Physical Anesthesia Plan  ASA: II  Anesthesia Plan: MAC   Post-op Pain Management:    Induction: Intravenous  Airway Management Planned: Nasal Cannula  Additional Equipment:   Intra-op Plan:   Post-operative Plan:   Informed Consent: I have reviewed the patients History and Physical, chart, labs and discussed the procedure including the risks, benefits and alternatives for the proposed anesthesia with the patient or authorized representative who has indicated his/her understanding and acceptance.     Plan Discussed with:   Anesthesia Plan Comments:         Anesthesia Quick Evaluation

## 2015-08-21 NOTE — Discharge Instructions (Signed)

## 2015-08-21 NOTE — Op Note (Signed)
Date of Admission: 08/21/2015  Date of Surgery: 08/21/2015   Pre-Op Dx: Cataract Right Eye  Post-Op Dx: Senile Nuclear Cataract Right  Eye,  Dx Code H25.11  Surgeon: Gemma Payor, M.D.  Assistants: None  Anesthesia: Topical with MAC  Indications: Painless, progressive loss of vision with compromise of daily activities.  Surgery: Cataract Extraction with Intraocular lens Implant Right Eye  Discription: The patient had dilating drops and viscous lidocaine placed into the Right eye in the pre-op holding area. After transfer to the operating room, a time out was performed. The patient was then prepped and draped. Beginning with a 75 degree blade a paracentesis port was made at the surgeon's 2 o'clock position. The anterior chamber was then filled with 1% non-preserved lidocaine. This was followed by filling the anterior chamber with Provisc.  A 2.46mm keratome blade was used to make a clear corneal incision at the temporal limbus.  A bent cystatome needle was used to create a continuous tear capsulotomy. Hydrodissection was performed with balanced salt solution on a Fine canula. The lens nucleus was then removed using the phacoemulsification handpiece. Residual cortex was removed with the I&A handpiece. The anterior chamber and capsular bag were refilled with Provisc. A posterior chamber intraocular lens was placed into the capsular bag with it's injector. The implant was positioned with the Kuglan hook. The Provisc was then removed from the anterior chamber and capsular bag with the I&A handpiece. Stromal hydration of the main incision and paracentesis port was performed with BSS on a Fine canula. The wounds were tested for leak which was negative. The patient tolerated the procedure well. There were no operative complications. The patient was then transferred to the recovery room in stable condition.  Complications: None  Specimen: None  EBL: None  Prosthetic device: Hoya iSert 250, power 19.5  D, SN [id number].

## 2015-08-22 ENCOUNTER — Encounter (HOSPITAL_COMMUNITY): Payer: Self-pay | Admitting: Ophthalmology

## 2015-09-19 DIAGNOSIS — Z131 Encounter for screening for diabetes mellitus: Secondary | ICD-10-CM | POA: Diagnosis not present

## 2015-09-19 DIAGNOSIS — I10 Essential (primary) hypertension: Secondary | ICD-10-CM | POA: Diagnosis not present

## 2015-09-19 DIAGNOSIS — I25118 Atherosclerotic heart disease of native coronary artery with other forms of angina pectoris: Secondary | ICD-10-CM | POA: Diagnosis not present

## 2015-09-19 DIAGNOSIS — Z125 Encounter for screening for malignant neoplasm of prostate: Secondary | ICD-10-CM | POA: Diagnosis not present

## 2015-09-27 DIAGNOSIS — Z23 Encounter for immunization: Secondary | ICD-10-CM | POA: Diagnosis not present

## 2015-12-21 DIAGNOSIS — Z1389 Encounter for screening for other disorder: Secondary | ICD-10-CM | POA: Diagnosis not present

## 2015-12-21 DIAGNOSIS — I10 Essential (primary) hypertension: Secondary | ICD-10-CM | POA: Diagnosis not present

## 2015-12-21 DIAGNOSIS — Z0000 Encounter for general adult medical examination without abnormal findings: Secondary | ICD-10-CM | POA: Diagnosis not present

## 2015-12-21 DIAGNOSIS — Z131 Encounter for screening for diabetes mellitus: Secondary | ICD-10-CM | POA: Diagnosis not present

## 2016-03-22 DIAGNOSIS — I10 Essential (primary) hypertension: Secondary | ICD-10-CM | POA: Diagnosis not present

## 2016-03-22 DIAGNOSIS — Z13220 Encounter for screening for lipoid disorders: Secondary | ICD-10-CM | POA: Diagnosis not present

## 2016-05-23 DIAGNOSIS — J440 Chronic obstructive pulmonary disease with acute lower respiratory infection: Secondary | ICD-10-CM | POA: Diagnosis not present

## 2016-05-23 DIAGNOSIS — I10 Essential (primary) hypertension: Secondary | ICD-10-CM | POA: Diagnosis not present

## 2016-06-17 DIAGNOSIS — I340 Nonrheumatic mitral (valve) insufficiency: Secondary | ICD-10-CM | POA: Diagnosis not present

## 2016-06-17 DIAGNOSIS — I517 Cardiomegaly: Secondary | ICD-10-CM | POA: Diagnosis not present

## 2016-06-17 DIAGNOSIS — R0789 Other chest pain: Secondary | ICD-10-CM | POA: Diagnosis not present

## 2016-06-21 DIAGNOSIS — J440 Chronic obstructive pulmonary disease with acute lower respiratory infection: Secondary | ICD-10-CM | POA: Diagnosis not present

## 2016-06-21 DIAGNOSIS — I10 Essential (primary) hypertension: Secondary | ICD-10-CM | POA: Diagnosis not present

## 2016-08-08 DIAGNOSIS — Z23 Encounter for immunization: Secondary | ICD-10-CM | POA: Diagnosis not present

## 2016-09-23 DIAGNOSIS — I10 Essential (primary) hypertension: Secondary | ICD-10-CM | POA: Diagnosis not present

## 2016-09-23 DIAGNOSIS — J440 Chronic obstructive pulmonary disease with acute lower respiratory infection: Secondary | ICD-10-CM | POA: Diagnosis not present

## 2016-12-23 DIAGNOSIS — J440 Chronic obstructive pulmonary disease with acute lower respiratory infection: Secondary | ICD-10-CM | POA: Diagnosis not present

## 2016-12-23 DIAGNOSIS — Z125 Encounter for screening for malignant neoplasm of prostate: Secondary | ICD-10-CM | POA: Diagnosis not present

## 2016-12-23 DIAGNOSIS — Z1389 Encounter for screening for other disorder: Secondary | ICD-10-CM | POA: Diagnosis not present

## 2016-12-23 DIAGNOSIS — Z0000 Encounter for general adult medical examination without abnormal findings: Secondary | ICD-10-CM | POA: Diagnosis not present

## 2016-12-23 DIAGNOSIS — I10 Essential (primary) hypertension: Secondary | ICD-10-CM | POA: Diagnosis not present

## 2016-12-23 DIAGNOSIS — Z131 Encounter for screening for diabetes mellitus: Secondary | ICD-10-CM | POA: Diagnosis not present

## 2017-01-07 DIAGNOSIS — Z87891 Personal history of nicotine dependence: Secondary | ICD-10-CM | POA: Diagnosis not present

## 2017-01-07 DIAGNOSIS — Z136 Encounter for screening for cardiovascular disorders: Secondary | ICD-10-CM | POA: Diagnosis not present

## 2017-01-07 DIAGNOSIS — R1084 Generalized abdominal pain: Secondary | ICD-10-CM | POA: Diagnosis not present

## 2017-01-17 DIAGNOSIS — H35033 Hypertensive retinopathy, bilateral: Secondary | ICD-10-CM | POA: Diagnosis not present

## 2017-01-17 DIAGNOSIS — H18413 Arcus senilis, bilateral: Secondary | ICD-10-CM | POA: Diagnosis not present

## 2017-01-17 DIAGNOSIS — I10 Essential (primary) hypertension: Secondary | ICD-10-CM | POA: Diagnosis not present

## 2017-03-21 DIAGNOSIS — I10 Essential (primary) hypertension: Secondary | ICD-10-CM | POA: Diagnosis not present

## 2017-03-21 DIAGNOSIS — L84 Corns and callosities: Secondary | ICD-10-CM | POA: Diagnosis not present

## 2017-03-21 DIAGNOSIS — J440 Chronic obstructive pulmonary disease with acute lower respiratory infection: Secondary | ICD-10-CM | POA: Diagnosis not present

## 2017-06-23 DIAGNOSIS — J440 Chronic obstructive pulmonary disease with acute lower respiratory infection: Secondary | ICD-10-CM | POA: Diagnosis not present

## 2017-06-23 DIAGNOSIS — I10 Essential (primary) hypertension: Secondary | ICD-10-CM | POA: Diagnosis not present

## 2017-09-03 DIAGNOSIS — Z23 Encounter for immunization: Secondary | ICD-10-CM | POA: Diagnosis not present

## 2017-09-22 DIAGNOSIS — I10 Essential (primary) hypertension: Secondary | ICD-10-CM | POA: Diagnosis not present

## 2017-09-22 DIAGNOSIS — J440 Chronic obstructive pulmonary disease with acute lower respiratory infection: Secondary | ICD-10-CM | POA: Diagnosis not present

## 2017-12-22 DIAGNOSIS — I872 Venous insufficiency (chronic) (peripheral): Secondary | ICD-10-CM | POA: Diagnosis not present

## 2017-12-22 DIAGNOSIS — L723 Sebaceous cyst: Secondary | ICD-10-CM | POA: Diagnosis not present

## 2017-12-22 DIAGNOSIS — J441 Chronic obstructive pulmonary disease with (acute) exacerbation: Secondary | ICD-10-CM | POA: Diagnosis not present

## 2017-12-22 DIAGNOSIS — I10 Essential (primary) hypertension: Secondary | ICD-10-CM | POA: Diagnosis not present

## 2018-03-25 DIAGNOSIS — J441 Chronic obstructive pulmonary disease with (acute) exacerbation: Secondary | ICD-10-CM | POA: Diagnosis not present

## 2018-03-25 DIAGNOSIS — I10 Essential (primary) hypertension: Secondary | ICD-10-CM | POA: Diagnosis not present

## 2018-06-15 DIAGNOSIS — H26491 Other secondary cataract, right eye: Secondary | ICD-10-CM | POA: Diagnosis not present

## 2018-06-15 DIAGNOSIS — H26493 Other secondary cataract, bilateral: Secondary | ICD-10-CM | POA: Diagnosis not present

## 2018-06-24 DIAGNOSIS — Z0000 Encounter for general adult medical examination without abnormal findings: Secondary | ICD-10-CM | POA: Diagnosis not present

## 2018-06-24 DIAGNOSIS — Z1389 Encounter for screening for other disorder: Secondary | ICD-10-CM | POA: Diagnosis not present

## 2018-06-24 DIAGNOSIS — I10 Essential (primary) hypertension: Secondary | ICD-10-CM | POA: Diagnosis not present

## 2018-06-24 DIAGNOSIS — J441 Chronic obstructive pulmonary disease with (acute) exacerbation: Secondary | ICD-10-CM | POA: Diagnosis not present

## 2018-08-14 DIAGNOSIS — Z23 Encounter for immunization: Secondary | ICD-10-CM | POA: Diagnosis not present

## 2018-09-24 DIAGNOSIS — I10 Essential (primary) hypertension: Secondary | ICD-10-CM | POA: Diagnosis not present

## 2018-09-24 DIAGNOSIS — J441 Chronic obstructive pulmonary disease with (acute) exacerbation: Secondary | ICD-10-CM | POA: Diagnosis not present

## 2018-09-24 DIAGNOSIS — Z125 Encounter for screening for malignant neoplasm of prostate: Secondary | ICD-10-CM | POA: Diagnosis not present

## 2018-09-24 DIAGNOSIS — Z1389 Encounter for screening for other disorder: Secondary | ICD-10-CM | POA: Diagnosis not present

## 2018-09-24 DIAGNOSIS — Z0000 Encounter for general adult medical examination without abnormal findings: Secondary | ICD-10-CM | POA: Diagnosis not present

## 2018-09-24 DIAGNOSIS — J449 Chronic obstructive pulmonary disease, unspecified: Secondary | ICD-10-CM | POA: Diagnosis not present

## 2018-10-26 DIAGNOSIS — I700 Atherosclerosis of aorta: Secondary | ICD-10-CM | POA: Diagnosis not present

## 2018-10-26 DIAGNOSIS — I509 Heart failure, unspecified: Secondary | ICD-10-CM | POA: Diagnosis not present

## 2018-10-26 DIAGNOSIS — I214 Non-ST elevation (NSTEMI) myocardial infarction: Secondary | ICD-10-CM | POA: Diagnosis not present

## 2018-10-26 DIAGNOSIS — R079 Chest pain, unspecified: Secondary | ICD-10-CM | POA: Diagnosis not present

## 2018-10-26 DIAGNOSIS — Z6832 Body mass index (BMI) 32.0-32.9, adult: Secondary | ICD-10-CM | POA: Diagnosis not present

## 2018-10-26 DIAGNOSIS — J449 Chronic obstructive pulmonary disease, unspecified: Secondary | ICD-10-CM | POA: Diagnosis not present

## 2018-10-26 DIAGNOSIS — Z818 Family history of other mental and behavioral disorders: Secondary | ICD-10-CM | POA: Diagnosis not present

## 2018-10-26 DIAGNOSIS — Z7982 Long term (current) use of aspirin: Secondary | ICD-10-CM | POA: Diagnosis not present

## 2018-10-26 DIAGNOSIS — Z888 Allergy status to other drugs, medicaments and biological substances status: Secondary | ICD-10-CM | POA: Diagnosis not present

## 2018-10-26 DIAGNOSIS — I110 Hypertensive heart disease with heart failure: Secondary | ICD-10-CM | POA: Diagnosis not present

## 2018-10-26 DIAGNOSIS — E871 Hypo-osmolality and hyponatremia: Secondary | ICD-10-CM | POA: Diagnosis not present

## 2018-10-26 DIAGNOSIS — R008 Other abnormalities of heart beat: Secondary | ICD-10-CM | POA: Diagnosis not present

## 2018-10-26 DIAGNOSIS — Z79899 Other long term (current) drug therapy: Secondary | ICD-10-CM | POA: Diagnosis not present

## 2018-10-26 DIAGNOSIS — F17200 Nicotine dependence, unspecified, uncomplicated: Secondary | ICD-10-CM | POA: Diagnosis not present

## 2018-10-26 DIAGNOSIS — Z801 Family history of malignant neoplasm of trachea, bronchus and lung: Secondary | ICD-10-CM | POA: Diagnosis not present

## 2018-10-27 DIAGNOSIS — I700 Atherosclerosis of aorta: Secondary | ICD-10-CM | POA: Diagnosis present

## 2018-10-27 DIAGNOSIS — I110 Hypertensive heart disease with heart failure: Secondary | ICD-10-CM | POA: Diagnosis present

## 2018-10-27 DIAGNOSIS — J90 Pleural effusion, not elsewhere classified: Secondary | ICD-10-CM | POA: Diagnosis not present

## 2018-10-27 DIAGNOSIS — R9431 Abnormal electrocardiogram [ECG] [EKG]: Secondary | ICD-10-CM | POA: Diagnosis not present

## 2018-10-27 DIAGNOSIS — I25119 Atherosclerotic heart disease of native coronary artery with unspecified angina pectoris: Secondary | ICD-10-CM | POA: Diagnosis not present

## 2018-10-27 DIAGNOSIS — J810 Acute pulmonary edema: Secondary | ICD-10-CM | POA: Diagnosis not present

## 2018-10-27 DIAGNOSIS — E8770 Fluid overload, unspecified: Secondary | ICD-10-CM | POA: Diagnosis not present

## 2018-10-27 DIAGNOSIS — I5021 Acute systolic (congestive) heart failure: Secondary | ICD-10-CM | POA: Diagnosis not present

## 2018-10-27 DIAGNOSIS — R0789 Other chest pain: Secondary | ICD-10-CM | POA: Diagnosis not present

## 2018-10-27 DIAGNOSIS — I509 Heart failure, unspecified: Secondary | ICD-10-CM | POA: Diagnosis not present

## 2018-10-27 DIAGNOSIS — I25118 Atherosclerotic heart disease of native coronary artery with other forms of angina pectoris: Secondary | ICD-10-CM | POA: Diagnosis present

## 2018-10-27 DIAGNOSIS — F17210 Nicotine dependence, cigarettes, uncomplicated: Secondary | ICD-10-CM | POA: Diagnosis present

## 2018-10-27 DIAGNOSIS — R079 Chest pain, unspecified: Secondary | ICD-10-CM | POA: Diagnosis not present

## 2018-10-27 DIAGNOSIS — J449 Chronic obstructive pulmonary disease, unspecified: Secondary | ICD-10-CM | POA: Diagnosis present

## 2018-10-27 DIAGNOSIS — F17200 Nicotine dependence, unspecified, uncomplicated: Secondary | ICD-10-CM | POA: Diagnosis not present

## 2018-10-27 DIAGNOSIS — I214 Non-ST elevation (NSTEMI) myocardial infarction: Secondary | ICD-10-CM | POA: Diagnosis present

## 2018-10-27 DIAGNOSIS — I272 Pulmonary hypertension, unspecified: Secondary | ICD-10-CM | POA: Diagnosis present

## 2018-10-27 DIAGNOSIS — E871 Hypo-osmolality and hyponatremia: Secondary | ICD-10-CM | POA: Diagnosis not present

## 2018-10-27 DIAGNOSIS — Z7982 Long term (current) use of aspirin: Secondary | ICD-10-CM | POA: Diagnosis not present

## 2018-10-27 DIAGNOSIS — I2583 Coronary atherosclerosis due to lipid rich plaque: Secondary | ICD-10-CM | POA: Diagnosis not present

## 2018-10-27 DIAGNOSIS — I2584 Coronary atherosclerosis due to calcified coronary lesion: Secondary | ICD-10-CM | POA: Diagnosis not present

## 2018-11-06 ENCOUNTER — Inpatient Hospital Stay (HOSPITAL_COMMUNITY)
Admission: EM | Admit: 2018-11-06 | Discharge: 2018-11-19 | DRG: 233 | Disposition: A | Discharge status: Home Health | Payer: Medicare Other | Attending: Cardiothoracic Surgery | Admitting: Cardiothoracic Surgery

## 2018-11-06 ENCOUNTER — Other Ambulatory Visit: Payer: Self-pay

## 2018-11-06 ENCOUNTER — Encounter (HOSPITAL_COMMUNITY): Payer: Self-pay | Admitting: *Deleted

## 2018-11-06 ENCOUNTER — Emergency Department (HOSPITAL_COMMUNITY): Payer: Medicare Other

## 2018-11-06 DIAGNOSIS — D62 Acute posthemorrhagic anemia: Secondary | ICD-10-CM | POA: Diagnosis not present

## 2018-11-06 DIAGNOSIS — E669 Obesity, unspecified: Secondary | ICD-10-CM | POA: Diagnosis present

## 2018-11-06 DIAGNOSIS — Z888 Allergy status to other drugs, medicaments and biological substances status: Secondary | ICD-10-CM

## 2018-11-06 DIAGNOSIS — I081 Rheumatic disorders of both mitral and tricuspid valves: Secondary | ICD-10-CM | POA: Diagnosis present

## 2018-11-06 DIAGNOSIS — R918 Other nonspecific abnormal finding of lung field: Secondary | ICD-10-CM | POA: Diagnosis not present

## 2018-11-06 DIAGNOSIS — Z7982 Long term (current) use of aspirin: Secondary | ICD-10-CM | POA: Diagnosis not present

## 2018-11-06 DIAGNOSIS — M1990 Unspecified osteoarthritis, unspecified site: Secondary | ICD-10-CM | POA: Diagnosis present

## 2018-11-06 DIAGNOSIS — Z9181 History of falling: Secondary | ICD-10-CM

## 2018-11-06 DIAGNOSIS — I214 Non-ST elevation (NSTEMI) myocardial infarction: Secondary | ICD-10-CM | POA: Diagnosis present

## 2018-11-06 DIAGNOSIS — Z951 Presence of aortocoronary bypass graft: Secondary | ICD-10-CM | POA: Diagnosis not present

## 2018-11-06 DIAGNOSIS — I4892 Unspecified atrial flutter: Secondary | ICD-10-CM | POA: Diagnosis present

## 2018-11-06 DIAGNOSIS — J9811 Atelectasis: Secondary | ICD-10-CM | POA: Diagnosis present

## 2018-11-06 DIAGNOSIS — I77819 Aortic ectasia, unspecified site: Secondary | ICD-10-CM | POA: Diagnosis present

## 2018-11-06 DIAGNOSIS — I110 Hypertensive heart disease with heart failure: Secondary | ICD-10-CM | POA: Diagnosis not present

## 2018-11-06 DIAGNOSIS — I340 Nonrheumatic mitral (valve) insufficiency: Secondary | ICD-10-CM | POA: Diagnosis not present

## 2018-11-06 DIAGNOSIS — E876 Hypokalemia: Secondary | ICD-10-CM | POA: Diagnosis not present

## 2018-11-06 DIAGNOSIS — M954 Acquired deformity of chest and rib: Secondary | ICD-10-CM | POA: Diagnosis present

## 2018-11-06 DIAGNOSIS — Z9841 Cataract extraction status, right eye: Secondary | ICD-10-CM | POA: Diagnosis not present

## 2018-11-06 DIAGNOSIS — I509 Heart failure, unspecified: Secondary | ICD-10-CM | POA: Insufficient documentation

## 2018-11-06 DIAGNOSIS — I700 Atherosclerosis of aorta: Secondary | ICD-10-CM | POA: Diagnosis not present

## 2018-11-06 DIAGNOSIS — I2510 Atherosclerotic heart disease of native coronary artery without angina pectoris: Secondary | ICD-10-CM | POA: Diagnosis present

## 2018-11-06 DIAGNOSIS — Z9842 Cataract extraction status, left eye: Secondary | ICD-10-CM

## 2018-11-06 DIAGNOSIS — J439 Emphysema, unspecified: Secondary | ICD-10-CM | POA: Diagnosis present

## 2018-11-06 DIAGNOSIS — Z716 Tobacco abuse counseling: Secondary | ICD-10-CM

## 2018-11-06 DIAGNOSIS — J90 Pleural effusion, not elsewhere classified: Secondary | ICD-10-CM | POA: Diagnosis not present

## 2018-11-06 DIAGNOSIS — Z720 Tobacco use: Secondary | ICD-10-CM | POA: Diagnosis not present

## 2018-11-06 DIAGNOSIS — I2721 Secondary pulmonary arterial hypertension: Secondary | ICD-10-CM | POA: Diagnosis present

## 2018-11-06 DIAGNOSIS — Z820 Family history of epilepsy and other diseases of the nervous system: Secondary | ICD-10-CM

## 2018-11-06 DIAGNOSIS — I5021 Acute systolic (congestive) heart failure: Secondary | ICD-10-CM | POA: Diagnosis not present

## 2018-11-06 DIAGNOSIS — I255 Ischemic cardiomyopathy: Secondary | ICD-10-CM | POA: Diagnosis present

## 2018-11-06 DIAGNOSIS — R079 Chest pain, unspecified: Secondary | ICD-10-CM | POA: Diagnosis not present

## 2018-11-06 DIAGNOSIS — Z4682 Encounter for fitting and adjustment of non-vascular catheter: Secondary | ICD-10-CM | POA: Diagnosis not present

## 2018-11-06 DIAGNOSIS — J449 Chronic obstructive pulmonary disease, unspecified: Secondary | ICD-10-CM | POA: Diagnosis not present

## 2018-11-06 DIAGNOSIS — Z6831 Body mass index (BMI) 31.0-31.9, adult: Secondary | ICD-10-CM

## 2018-11-06 DIAGNOSIS — I5023 Acute on chronic systolic (congestive) heart failure: Secondary | ICD-10-CM | POA: Diagnosis present

## 2018-11-06 DIAGNOSIS — Z961 Presence of intraocular lens: Secondary | ICD-10-CM | POA: Diagnosis present

## 2018-11-06 DIAGNOSIS — F17210 Nicotine dependence, cigarettes, uncomplicated: Secondary | ICD-10-CM | POA: Diagnosis present

## 2018-11-06 DIAGNOSIS — E871 Hypo-osmolality and hyponatremia: Secondary | ICD-10-CM | POA: Insufficient documentation

## 2018-11-06 DIAGNOSIS — Z79899 Other long term (current) drug therapy: Secondary | ICD-10-CM

## 2018-11-06 DIAGNOSIS — I25118 Atherosclerotic heart disease of native coronary artery with other forms of angina pectoris: Secondary | ICD-10-CM | POA: Diagnosis not present

## 2018-11-06 DIAGNOSIS — Z01810 Encounter for preprocedural cardiovascular examination: Secondary | ICD-10-CM | POA: Diagnosis not present

## 2018-11-06 DIAGNOSIS — Z8249 Family history of ischemic heart disease and other diseases of the circulatory system: Secondary | ICD-10-CM

## 2018-11-06 HISTORY — DX: Unspecified osteoarthritis, unspecified site: M19.90

## 2018-11-06 HISTORY — DX: Chronic obstructive pulmonary disease, unspecified: J44.9

## 2018-11-06 LAB — BASIC METABOLIC PANEL
Anion gap: 12 (ref 5–15)
Anion gap: 10 (ref 5–15)
BUN: 5 mg/dL — ABNORMAL LOW (ref 8–23)
BUN: <5 mg/dL — ABNORMAL LOW (ref 8–23)
CHLORIDE: 83 mmol/L — AB (ref 98–111)
CO2: 20 mmol/L — ABNORMAL LOW (ref 22–32)
CO2: 24 mmol/L (ref 22–32)
Calcium: 8.6 mg/dL — ABNORMAL LOW (ref 8.9–10.3)
Calcium: 8.6 mg/dL — ABNORMAL LOW (ref 8.9–10.3)
Chloride: 82 mmol/L — ABNORMAL LOW (ref 98–111)
Creatinine, Ser: 0.83 mg/dL (ref 0.61–1.24)
Creatinine, Ser: 0.82 mg/dL (ref 0.61–1.24)
GFR calc Af Amer: >60 mL/min (ref >60)
GFR calc Af Amer: >60 mL/min (ref >60)
GFR calc non Af Amer: >60 mL/min (ref >60)
GFR calc non Af Amer: >60 mL/min (ref >60)
Glucose, Bld: 112 mg/dL — ABNORMAL HIGH (ref 70–99)
Glucose, Bld: 100 mg/dL — ABNORMAL HIGH (ref 70–99)
Potassium: 4.1 mmol/L (ref 3.5–5.1)
Potassium: 4 mmol/L (ref 3.5–5.1)
Sodium: 114 mmol/L — CL (ref 135–145)
Sodium: 117 mmol/L — CL (ref 135–145)

## 2018-11-06 LAB — CBC
HCT: 35.2 % — ABNORMAL LOW (ref 39.0–52.0)
HCT: 33.9 % — ABNORMAL LOW (ref 39.0–52.0)
Hemoglobin: 13.2 g/dL (ref 13.0–17.0)
Hemoglobin: 12.6 g/dL — ABNORMAL LOW (ref 13.0–17.0)
MCH: 34 pg (ref 26.0–34.0)
MCH: 33.5 pg (ref 26.0–34.0)
MCHC: 37.5 g/dL — ABNORMAL HIGH (ref 30.0–36.0)
MCHC: 37.2 g/dL — ABNORMAL HIGH (ref 30.0–36.0)
MCV: 90.7 fL (ref 80.0–100.0)
MCV: 90.2 fL (ref 80.0–100.0)
Platelets: 312 10*3/uL (ref 150–400)
Platelets: 320 10*3/uL (ref 150–400)
RBC: 3.88 MIL/uL — ABNORMAL LOW (ref 4.22–5.81)
RBC: 3.76 MIL/uL — AB (ref 4.22–5.81)
RDW: 11.5 % (ref 11.5–15.5)
RDW: 11.4 % — ABNORMAL LOW (ref 11.5–15.5)
WBC: 8.2 10*3/uL (ref 4.0–10.5)
WBC: 9.3 10*3/uL (ref 4.0–10.5)
nRBC: 0 % (ref 0.0–0.2)
nRBC: 0 % (ref 0.0–0.2)

## 2018-11-06 LAB — BRAIN NATRIURETIC PEPTIDE: B Natriuretic Peptide: 989.9 pg/mL — ABNORMAL HIGH (ref 0.0–100.0)

## 2018-11-06 LAB — I-STAT TROPONIN, ED: TROPONIN I, POC: 0.02 ng/mL (ref 0.00–0.08)

## 2018-11-06 MED ORDER — MOMETASONE FURO-FORMOTEROL FUM 200-5 MCG/ACT IN AERO
2.0000 | INHALATION_SPRAY | Freq: Two times a day (BID) | RESPIRATORY_TRACT | Status: DC
  Administered 2018-11-07 – 2018-11-12 (×10): 2 via RESPIRATORY_TRACT
  Administered 2018-11-14 (×2): 1 via RESPIRATORY_TRACT
  Administered 2018-11-15 – 2018-11-19 (×9): 2 via RESPIRATORY_TRACT
  Filled 2018-11-06 (×3): qty 8.8

## 2018-11-06 MED ORDER — SODIUM CHLORIDE 0.9% FLUSH
3.0000 mL | Freq: Two times a day (BID) | INTRAVENOUS | Status: DC
  Administered 2018-11-06 – 2018-11-11 (×4): 3 mL via INTRAVENOUS

## 2018-11-06 MED ORDER — FUROSEMIDE 10 MG/ML IJ SOLN
40.0000 mg | Freq: Once | INTRAMUSCULAR | Status: AC
  Administered 2018-11-06: 40 mg via INTRAVENOUS
  Filled 2018-11-06: qty 4

## 2018-11-06 MED ORDER — DM-GUAIFENESIN ER 30-600 MG PO TB12
1.0000 | ORAL_TABLET | Freq: Two times a day (BID) | ORAL | Status: DC
  Administered 2018-11-07 – 2018-11-19 (×22): 1 via ORAL
  Filled 2018-11-06 (×25): qty 1

## 2018-11-06 MED ORDER — SODIUM CHLORIDE 0.9% FLUSH: 3.0000 mL | INTRAVENOUS | Status: DC | PRN

## 2018-11-06 MED ORDER — SODIUM CHLORIDE 0.9 % IV SOLN
250.0000 mL | INTRAVENOUS | Status: DC | PRN
  Administered 2018-11-10: 250 mL via INTRAVENOUS

## 2018-11-06 MED ORDER — FUROSEMIDE 10 MG/ML IJ SOLN
4.0000 mg/h | INTRAVENOUS | Status: DC
  Administered 2018-11-06 – 2018-11-09 (×3): 4 mg/h via INTRAVENOUS
  Filled 2018-11-06: qty 25
  Filled 2018-11-06: qty 21

## 2018-11-06 MED ORDER — LOSARTAN POTASSIUM 25 MG PO TABS
25.0000 mg | ORAL_TABLET | Freq: Every day | ORAL | Status: DC
  Administered 2018-11-07 – 2018-11-08 (×2): 25 mg via ORAL
  Filled 2018-11-06 (×2): qty 1

## 2018-11-06 MED ORDER — ASPIRIN EC 81 MG PO TBEC
81.0000 mg | DELAYED_RELEASE_TABLET | Freq: Every day | ORAL | Status: DC
  Administered 2018-11-07 – 2018-11-12 (×6): 81 mg via ORAL
  Filled 2018-11-06 (×6): qty 1

## 2018-11-06 MED ORDER — ACETAMINOPHEN 325 MG PO TABS
650.0000 mg | ORAL_TABLET | ORAL | Status: DC | PRN
  Administered 2018-11-06: 650 mg via ORAL
  Filled 2018-11-06: qty 2

## 2018-11-06 MED ORDER — NICOTINE 21 MG/24HR TD PT24
21.0000 mg | MEDICATED_PATCH | Freq: Every day | TRANSDERMAL | Status: DC
  Administered 2018-11-07 – 2018-11-19 (×5): 21 mg via TRANSDERMAL
  Filled 2018-11-06 (×10): qty 1

## 2018-11-06 MED ORDER — ROSUVASTATIN CALCIUM 20 MG PO TABS
20.0000 mg | ORAL_TABLET | Freq: Every evening | ORAL | Status: DC
  Administered 2018-11-06 – 2018-11-18 (×13): 20 mg via ORAL
  Filled 2018-11-06 (×13): qty 1

## 2018-11-06 MED ORDER — FUROSEMIDE 10 MG/ML IJ SOLN: 40.0000 mg | Freq: Once | INTRAMUSCULAR | Status: DC

## 2018-11-06 MED ORDER — ONDANSETRON HCL 4 MG/2ML IJ SOLN: 4.0000 mg | Freq: Four times a day (QID) | INTRAMUSCULAR | Status: DC | PRN

## 2018-11-06 MED ORDER — ENOXAPARIN SODIUM 40 MG/0.4ML ~~LOC~~ SOLN
40.0000 mg | SUBCUTANEOUS | Status: DC
  Administered 2018-11-06 – 2018-11-11 (×6): 40 mg via SUBCUTANEOUS
  Filled 2018-11-06 (×6): qty 0.4

## 2018-11-06 NOTE — ED Provider Notes (Signed)
[facility] Provider Note   CSN: [id number] Arrival date & time: 11/06/18  1145     History   Chief Complaint Chief Complaint  Patient presents with  . Chest Pain    HPI Patria ManeRonald W Bertoni is a 72 y.o. male with a h/o of COPD, NSTEMI, and HTN presents the emergency department with a chief complaint of shortness of breath.  The [facility] after having an NSTEMI.  He was told that he would need triple bypass surgery, but the patient wanted to get established with a cardiologist [facility] and left AMA.  Reports that he has had outpatient appointment scheduled with cardiology for next week, but was unable to wait until the appointment as his symptoms of worsened.  His wife who is a retired Engineer, civil (consulting)nurse reports constant shortness of breath that significantly worsened last night.  She states that he was working so hard to breathe that he was grunting.  She also reports significant swelling in his abdomen and legs. He was discharged on 40 mg Lasix, which he has been compliant with.  He was also started on nitroglycerin, Crestor, and metoprolol at discharge.  His wife also reports he has been compliant with a low-sodium diet.  She is concerned that his sodium may be low as she states that it was 127 during his last admission.  She states that he has been intermittently confused over the last few days. Reports his weight has increased from 198 to 209 over the last week.   He denies chest pain, fever, chills, abdominal pain, nausea, vomiting, diarrhea, palpitations, rash, numbness, or weakness.  No history of cardiothoracic surgery.  The history is provided by the patient. No language interpreter was used.    Past Medical History:  Diagnosis Date  . Hypertension     There are no active problems to display for this patient.   Past Surgical History:  Procedure Laterality Date  . CATARACT EXTRACTION W/PHACO Left  07/06/2015   Procedure: CATARACT EXTRACTION PHACO AND INTRAOCULAR LENS PLACEMENT (IOC);  Surgeon: Gemma PayorKerry Hunt, MD;  Location: [facility];  Service: Ophthalmology;  Laterality: Left;  CDE 8.33  . CATARACT EXTRACTION W/PHACO Right 08/21/2015   Procedure: CATARACT EXTRACTION PHACO AND INTRAOCULAR LENS PLACEMENT (IOC);  Surgeon: Gemma PayorKerry Hunt, MD;  Location: [facility];  Service: Ophthalmology;  Laterality: Right;  CDE:9.89  . KNEE ARTHROSCOPY Right 2006  . KNEE ARTHROSCOPY Left 2000        Home Medications    Prior to Admission medications   Medication Sig Start Date End Date Taking? Authorizing Provider  amLODipine-benazepril (LOTREL) 10-20 MG per capsule Take 1 capsule by mouth daily.   Yes [provider]  aspirin EC 81 MG tablet Take 81 mg by mouth daily.   Yes [provider]  furosemide (LASIX) 40 MG tablet Take 40 mg by mouth every morning. 10/30/18  Yes [provider]  metoprolol tartrate (LOPRESSOR) 25 MG tablet Take 12.5 mg by mouth 2 (two) times daily. 10/30/18  Yes [provider]  nicotine (NICODERM CQ - DOSED IN MG/24 HOURS) 21 mg/24hr patch Place 21 mg onto the skin daily. Alternate shoulder   Yes [provider]  guaiFENesin (MUCINEX) 600 MG 12 hr tablet Take 1,200 mg by mouth 2 (two) times daily.    [provider]  isosorbide mononitrate (IMDUR) 30 MG 24 hr tablet Take 30 mg by mouth daily.    [provider]    Family History Family History  Problem Relation Age of Onset  . Alzheimer's disease Mother   . Heart disease Mother        diagnosed in older age    Social History Social History   Tobacco Use  . Smoking status: Current Every Day Smoker    Packs/day: 1.50    Years: 20.00    Pack years: 30.00    Types: Cigarettes  . Smokeless tobacco: Never Used  Substance Use Topics  . Alcohol use: No  . Drug use: No     Allergies   Albuterol   Review of Systems Review of Systems  Constitutional: Negative  for appetite change, chills and fever.  Respiratory: Positive for shortness of breath.   Cardiovascular: Positive for leg swelling. Negative for chest pain and palpitations.  Gastrointestinal: Negative for abdominal pain, diarrhea, nausea and vomiting.  Genitourinary: Negative for dysuria.  Musculoskeletal: Negative for back pain, myalgias and neck pain.  Skin: Negative for rash.  Allergic/Immunologic: Negative for immunocompromised state.  Neurological: Negative for weakness, numbness and headaches.  Psychiatric/Behavioral: Positive for confusion (intermittent). The patient is not nervous/anxious.    Physical Exam Updated Vital Signs BP 101/63   Pulse 62   Temp (!) 97.5 F (36.4 C) (Oral)   Resp (!) 22   SpO2 100%   Physical Exam Vitals signs and nursing note reviewed.  Constitutional:      Appearance: He is well-developed.     Comments: Appears fluid overloaded.   HENT:     Head: Normocephalic.  Eyes:     Conjunctiva/sclera: Conjunctivae normal.  Neck:     Musculoskeletal: Neck supple.  Cardiovascular:     Rate and Rhythm: Normal rate and regular rhythm.     Pulses:          Radial pulses are 2+ on the right side and 2+ on the left side.       Dorsalis pedis pulses are 2+ on the right side and 2+ on the left side.     Heart sounds: No murmur. No friction rub. No gallop. No S3 or S4 sounds.   Pulmonary:     Effort: Pulmonary effort is normal.     Comments: Abdomen is mildly distended, but soft.  There is moderate edema noted to the groin.  Abdominal:     General: There is no distension.     Palpations: Abdomen is soft.  Musculoskeletal:     Right lower leg: Edema present.     Left lower leg: Edema present.     Comments: 3+ pitting edema to the bilateral lower extremities that extends to the thighs.   Skin:    General: Skin is warm and dry.     Capillary Refill: Capillary refill takes less than 2 seconds.     Coloration: Skin is not pale.  Neurological:     Mental  Status: He is alert.     Comments: Alert and oriented x4.  Moves all 4 extremities.  Answers questions in complete, fluent sentences.  Psychiatric:        Behavior: Behavior normal.    ED Treatments / Results  Labs (all labs ordered are listed, but only abnormal results are displayed) Labs Reviewed  BASIC METABOLIC PANEL - Abnormal; Notable for the following components:      Result Value   Sodium 114 (*)    Chloride 82 (*)    CO2 20 (*)    Glucose, Bld 112 (*)  BUN 5 (*)    Calcium 8.6 (*)    All other components within normal limits  CBC - Abnormal; Notable for the following components:   RBC 3.88 (*)    HCT 35.2 (*)    MCHC 37.5 (*)    All other components within normal limits  BRAIN NATRIURETIC PEPTIDE - Abnormal; Notable for the following components:   B Natriuretic Peptide 989.9 (*)    All other components within normal limits  BASIC METABOLIC PANEL  BASIC METABOLIC PANEL  I-STAT TROPONIN, ED    EKG EKG Interpretation  Date/Time:  Friday November 06 2018 11:58:00 EST Ventricular Rate:  71 PR Interval:  186 QRS Duration: 104 QT Interval:  436 QTC Calculation: 473 R Axis:   -151 Text Interpretation:  Normal sinus rhythm Right superior axis deviation Pulmonary disease pattern Septal infarct , age undetermined ST & T wave abnormality, consider lateral ischemia Abnormal ECG No significant change since last tracing Confirmed by Linwood Dibbles [phone]) on 11/06/2018 12:44:10 PM   Radiology Dg Chest 2 View  Result Date: 11/06/2018 CLINICAL DATA:  2-3 week history of chest pain and shortness of breath that acutely worsened today. Current history of hypertension. Current smoker. Prior MI. EXAM: CHEST - 2 VIEW COMPARISON:  10/26/2018. FINDINGS: AP ERECT and LATERAL images were obtained. Cardiac silhouette moderately enlarged, unchanged when allowing for differences in technique. Thoracic aorta atherosclerotic, unchanged. Hilar and mediastinal contours otherwise unremarkable.  Mild pulmonary venous hypertension without overt edema. Prominent bronchovascular markings diffusely and central peribronchial thickening, unchanged. Small BILATERAL pleural effusions best visualized on the LATERAL image with associated mild atelectasis in the lower lobes. Lungs otherwise clear. Remote healed fracture involving the RIGHT clavicle. Degenerative changes throughout the thoracic spine with ANTERIOR wedge compression deformities of multiple thoracic vertebrae, unchanged. IMPRESSION: 1. Small BILATERAL pleural effusions with associated mild passive atelectasis in the lower lobes. No acute cardiopulmonary disease otherwise. 2. Stable moderate cardiomegaly without pulmonary edema. 3. Stable moderate changes of chronic bronchitis and/or asthma. Electronically Signed   By: Hulan Saas M.D.   On: 11/06/2018 13:00    Procedures Procedures (including critical care time)  Medications Ordered in ED Medications  furosemide (LASIX) 250 mg in dextrose 5 % 250 mL (1 mg/mL) infusion (has no administration in time range)  furosemide (LASIX) injection 40 mg (has no administration in time range)     Initial Impression / Assessment and Plan / ED Course  I have reviewed the triage vital signs and the nursing notes.  Pertinent labs & imaging results that were available during my care of the patient were reviewed by me and considered in my medical decision making (see chart for details).     72 year old male with a h/o of COPD, NSTEMI, and HTN presents the emergency department presenting from home with worsening shortness of breath that significantly worsened last night.  He has had an 11 pound weight gain over the last week despite compliance with a low-sodium diet and his home Lasix.  He left AMA from [facility] after he was admitted for an end STEMI because he wanted to establish cardiology care in [place].  He underwent a left heart cath and was found to have severe multivessel  disease.  Last dose of home Lasix was this morning.  He has no chest pain at this time.  No tachypnea or hypoxia.  He is normotensive and afebrile.  The patient was discussed and independently evaluated by Dr. Lynelle Doctor, attending physician.   Labs are  notable for hyponatremia of 114 (lowest was 127 at [facility]).  I suspect that this is secondary to volume overload.  BNP is 990, improved from ~1200 at [facility].  Troponin is negative.  EKG unchanged from previous.  Chest x-ray with small bilateral pleural effusions.  Suspect this is secondary to acute heart failure.  No anginal symptoms at this time. No recurrent NSTEMI.   - Consulted the cardiology team and spoke with Dr. Julien Girt. Cardiology will see the patient. Received call back from cardiology who requested medical admission for patient's hyponatremia. No treatment recommendations from cardiology at this time.   - Consulted for admission and spoke with Dr. Butler Denmark.  Dr. Butler Denmark defers admission at this time and recommends cardiology admission.  She has spoken with PA Kroeger who plans to speak with her attending physician regarding admission.   -4:22 PM Cardiology will admit. The patient appears reasonably stabilized for admission considering the current resources, flow, and capabilities available in the ED at this time, and I doubt any other [facility] requiring further screening and/or treatment in the ED prior to admission.  Final Clinical Impressions(s) / ED Diagnoses   Final diagnoses:  Acute on chronic congestive heart failure, unspecified heart failure type [facility])  Hyponatremia  Multiple vessel coronary artery disease    ED Discharge Orders    None       Barkley Boards, PA-C 11/06/18 1625    Linwood Dibbles, MD 11/09/18 [phone]

## 2018-11-06 NOTE — ED Notes (Signed)
Paged cards per RN Alexa

## 2018-11-06 NOTE — ED Notes (Signed)
Attempted to give report , nurse not available at this time and will call me for report

## 2018-11-06 NOTE — ED Notes (Signed)
Mia, PA aware of 114 sodium

## 2018-11-06 NOTE — ED Triage Notes (Signed)
Pt reports chest pain intermittently, and was sent to [facility], diagnosed with NSTEMI and reportedly needs a triple bypass. Wife reports increased SOB, confusion.

## 2018-11-06 NOTE — H&P (Addendum)
Cardiology Consult:   Patient ID: David Mack MRN: [id number]; DOB: [birth year]   Admission date: 11/06/2018  Primary Care Provider: Toma Deiters, MD Primary Cardiologist: New to [facility]; Dr. Herbie Baltimore Primary Electrophysiologist:  None   Chief complaint: SOB, LE edema, and confusion  Patient Profile:   David Mack is a 72 y.o. male with a PMH of HTN and tobacco abuse, with recent admission to [facility] where he was found to have multivessel CAD recommended for CABG, as well as acute systolic CHF (EF 32-95%), who left AMA to pursue cardiac care in [place], who presented with SOB, LE edema, and confusion. Cardiology asked to see at the request of Dr. Lynelle Doctor.   History of Present Illness:   David Mack was hospitalized at [facility] from 10/27/18-10/30/18. He initially presented with exertional chest pain, found to have an NSTEMI with troponins peaking at 3.7. He underwent a LHC 10/30/18 which revealed multivessel CAD. CT Surgery evaluated the patient and recommended cardiac MRI to assess viability, PFTs, and LE vein mapping prior to revascularization. Patient adamant on transitioning care to [facility] prior to any further cardiac work-up left AMA. Also with pulmonary vascular congestion on CXR and an elevated BNP to 1516. Echo that admission revealed EF 25-30%. He was diuresed with IV lasix and felt to be poorly optimized from a fluid standpoint at the time of patient leaving; sent home on 40mg  lasix daily.   Since leaving [facility], patient reported initially feeling okay for a couple days, however began experiencing worsening SOB and significant LE edema. Wife had been keeping a record of his daily weights and he has gained 10lbs since discharge. She also reports marginal urine output over the last several days despite taking po lasix 40mg  daily. He reports orthopnea and has been sleeping in a recliner for quite some time. Also with occasional PND and wife reports observing apneic episodes.  Thankfully he has not had any chest pain since leaving [facility]. Wife reports increased confusion for several days and very poor appetite. No fevers, nausea, vomiting, headaches, vision changes, or syncope.   ED course: VSS. Labs notable for Na 114, K 4.1, Cr 0.83, Hgb 13.2, PLT 312, Trop 0.02, CXR with small bilateral pleural effusions, and cardiomegaly without pulmonary edema. EKG with sinus rhythm with inverted P waves concerning for ectopic atrial rhythm, no STE/D. Cardiology asked to evaluate.    Past Medical History:  Diagnosis Date  . Hypertension     Past Surgical History:  Procedure Laterality Date  . CATARACT EXTRACTION W/PHACO Left 07/06/2015   Procedure: CATARACT EXTRACTION PHACO AND INTRAOCULAR LENS PLACEMENT (IOC);  Surgeon: Gemma Payor, MD;  Location: [facility];  Service: Ophthalmology;  Laterality: Left;  CDE 8.33  . CATARACT EXTRACTION W/PHACO Right 08/21/2015   Procedure: CATARACT EXTRACTION PHACO AND INTRAOCULAR LENS PLACEMENT (IOC);  Surgeon: Gemma Payor, MD;  Location: [facility];  Service: Ophthalmology;  Laterality: Right;  CDE:9.89  . KNEE ARTHROSCOPY Right 2006  . KNEE ARTHROSCOPY Left 2000     Medications Prior to Admission: Prior to Admission medications   Medication Sig Start Date End Date Taking? Authorizing Provider  amLODipine-benazepril (LOTREL) 10-20 MG per capsule Take 1 capsule by mouth daily.    [provider]  aspirin EC 81 MG tablet Take 81 mg by mouth daily.    [provider]  isosorbide mononitrate (IMDUR) 30 MG 24 hr tablet Take 30 mg by mouth daily.    [provider]     Allergies:  Allergies  Allergen Reactions  . Albuterol     Dizziness     Social History:   Social History   Socioeconomic History  . Marital status: Married    Spouse name: Not on file  . Number of children: Not on file  . Years of education: Not on file  . Highest education level: Not on file  Occupational History  . Not on file  Social Needs   . Financial resource strain: Not on file  . Food insecurity:    Worry: Not on file    Inability: Not on file  . Transportation needs:    Medical: Not on file    Non-medical: Not on file  Tobacco Use  . Smoking status: Current Every Day Smoker    Packs/day: 1.50    Years: 20.00    Pack years: 30.00    Types: Cigarettes  . Smokeless tobacco: Never Used  Substance and Sexual Activity  . Alcohol use: No  . Drug use: No  . Sexual activity: Not on file  Lifestyle  . Physical activity:    Days per week: Not on file    Minutes per session: Not on file  . Stress: Not on file  Relationships  . Social connections:    Talks on phone: Not on file    Gets together: Not on file    Attends religious service: Not on file    Active member of club or organization: Not on file    Attends meetings of clubs or organizations: Not on file    Relationship status: Not on file  . Intimate partner violence:    Fear of current or ex partner: Not on file    Emotionally abused: Not on file    Physically abused: Not on file    Forced sexual activity: Not on file  Other Topics Concern  . Not on file  Social History Narrative  . Not on file    Family History:   The patient's family history includes Alzheimer's disease in his mother; Heart disease in his mother.    ROS:  Please see the history of present illness.  All other ROS reviewed and negative.     Physical Exam/Data:   Vitals:   11/06/18 1200 11/06/18 1331 11/06/18 1400  BP: 136/71 101/64 101/63  Pulse: 66 65 62  Resp: 18 (!) 24 (!) 22  Temp: (!) 97.5 F (36.4 C)    TempSrc: Oral    SpO2: 100% 100% 100%   No intake or output data in the 24 hours ending 11/06/18 1544 There were no vitals filed for this visit. There is no height or weight on file to calculate BMI.  General:  Well nourished, well developed, laying in bed in no acute distress HEENT: sclera anicteric  Lymph: no adenopathy Neck: no JVD Endocrine:  No  thryomegaly Vascular: No carotid bruits Cardiac:  normal S1, S2; RRR; no murmurs, rubs, or gallops Lungs:  clear to auscultation bilaterally, no wheezing, rhonchi or rales  Abd: soft, nontender, no hepatomegaly  Ext: significant bilateral upper and lower extremity edema with pitting to his thighs.  Musculoskeletal:  No deformities, BUE and BLE strength normal and equal Skin: warm and dry  Neuro:  CNs 2-12 intact, no focal abnormalities noted Psych:  Normal affect    EKG:  Sinus rhythm with inverted P waves c/f ectopic atrial rhythm, no STE/D  Relevant CV Studies: Left heart catheterization 10/30/18: FINDINGS/RECOMMENDATIONS:  1. Severe fluoroscopic coronary calcification.  2. LMT  distal 25%. LAD heavily calcified prox 80% and mid focal 50%. Long D1 has ostial 80% stenosis. Cx prox tandem heavily calcified 80% and 70% stenoses. Long OM2 has prox 40% plaque. Dom RCA prox 99% followed by mid 100%. Mid to distal segments have good collaterals from left.  Gave pt and wife pics and updated referring cardiologist (Dr Faylene MillionYeboah). Urged to stop smoking.  Echocardiogram 10/28/18: SUMMARY The left ventricle is moderately dilated. There is normal left ventricular wall thickness.  Left ventricular systolic function is severely reduced. LV ejection fraction = 25-30%. Only the basal segments show significant contractile function, except for the  basal inferolateral segment which is also contractile. The mid and apical  segments are severly hypokinetic to akinetic.  Left ventricular filling pattern is pseudonormal. The right ventricle is mildly dilated. The left atrium is severely dilated. The right atrium is moderately dilated. There is tenting of the mitral valve leaflets. There is moderate mitral regurgitation. There is mild tricuspid regurgitation. Moderate pulmonary hypertension. Estimated right ventricular systolic  pressure is 64 mmHg. Mildly dilated ascending aorta. (3.7cm) There  is no pericardial effusion. There is no comparison study available.  Laboratory Data:  Chemistry Recent Labs  Lab 11/06/18 1206  NA 114*  K 4.1  CL 82*  CO2 20*  GLUCOSE 112*  BUN 5*  CREATININE 0.83  CALCIUM 8.6*  GFRNONAA >60  GFRAA >60  ANIONGAP 12    No results for input(s): PROT, ALBUMIN, AST, ALT, ALKPHOS, BILITOT in the last 168 hours. Hematology Recent Labs  Lab 11/06/18 1206  WBC 8.2  RBC 3.88*  HGB 13.2  HCT 35.2*  MCV 90.7  MCH 34.0  MCHC 37.5*  RDW 11.5  PLT 312   Cardiac EnzymesNo results for input(s): TROPONINI in the last 168 hours.  Recent Labs  Lab 11/06/18 1213  TROPIPOC 0.02    BNP Recent Labs  Lab 11/06/18 1206  BNP 989.9*    DDimer No results for input(s): DDIMER in the last 168 hours.  Radiology/Studies:  Dg Chest 2 View  Result Date: 11/06/2018 CLINICAL DATA:  2-3 week history of chest pain and shortness of breath that acutely worsened today. Current history of hypertension. Current smoker. Prior MI. EXAM: CHEST - 2 VIEW COMPARISON:  10/26/2018. FINDINGS: AP ERECT and LATERAL images were obtained. Cardiac silhouette moderately enlarged, unchanged when allowing for differences in technique. Thoracic aorta atherosclerotic, unchanged. Hilar and mediastinal contours otherwise unremarkable. Mild pulmonary venous hypertension without overt edema. Prominent bronchovascular markings diffusely and central peribronchial thickening, unchanged. Small BILATERAL pleural effusions best visualized on the LATERAL image with associated mild atelectasis in the lower lobes. Lungs otherwise clear. Remote healed fracture involving the RIGHT clavicle. Degenerative changes throughout the thoracic spine with ANTERIOR wedge compression deformities of multiple thoracic vertebrae, unchanged. IMPRESSION: 1. Small BILATERAL pleural effusions with associated mild passive atelectasis in the lower lobes. No acute cardiopulmonary disease otherwise. 2. Stable moderate  cardiomegaly without pulmonary edema. 3. Stable moderate changes of chronic bronchitis and/or asthma. Electronically Signed   By: Hulan Saashomas  Lawrence M.D.   On: 11/06/2018 13:00    Assessment and Plan:   1. Acute systolic CHF: EF 16-10%25-30% on echo 10/28/18. Likely ischemic cardiomyopathy given LHC findings. BNP was elevated to 1516 10/27/18. CXR with pulm vascular congestion. He was diuresed with IV lasix and felt to not be medically optimized from a fluid status at the [facility], but was sent home with lasix 40mg  daily. BNP this admission is 989. CXR without pulmonary  vascular congestion. Lungs are clear on exam, however patient is markedly volume overloaded with anasarca  - Will transition off ACEi to ARB for easier transition to entresto once symptoms improve.  - Will stop amlodipine given acute systolic dysfunction - Will hold metoprolol given acute decompensated HF - Will initiate a lasix gtt for marked volume overload.  - Will place foley catheter for strict I&O monitoring - Monitor electrolytes closely and replete to maintain K>4, Mg >2  2. Hyponatremia: Na 114 on labs today; 127 [facility]. He was sent home on lasix 40mg  daily. Wife reports some confusions at home. Likely 2/2 hypervolemia as patient is markedly volume overloaded.  - Will start a lasix gtt - Monitor Na closely - will repeat labs this evening - goal repletion is rise in Na by no more than 8 mEq/L over the next 24 hours.    3. Multivessel CAD: [facility] for NSTEMI (trop peaked at 3.7) where he underwent a LHC 10/29/18 and was recommended for CT Surgery evaluation for CABG. He was recommended to undergo cardiac MRI testing to assess viability, PFTs, and LE vein mapping, however patient was adamant on going home and [facility]. EKG this admission non-ischemic. No recurrent chest pain.  - Continue aspirin and statin - Will consider completing  preoperative work-up once clinical status is improved.   4. History of hospital delirium: wife reported this was an issue [facility].  - Consider haldol if delirium reoccurs.  5. Tobacco abuse: patient reports using nicotine patches for the past 1-2 years in an effort to cut back on smoking. Still smoking 1 pack every 2-3 days. Has never tried medications to quit in the past and is hesitant to do so in the future - Continue to encourage smoking cessation.   6. Suspected OSA: wife reports snoring and apneic episodes.  - Could consider outpatient sleep study after resolution/management of above illnesses  Severity of Illness: The appropriate patient status for this patient is INPATIENT. Inpatient status is judged to be reasonable and necessary in order to provide the required intensity of service to ensure the patient's safety. The patient's presenting symptoms, physical exam findings, and initial radiographic and laboratory data in the context of their chronic comorbidities is felt to place them at high risk for further clinical deterioration. Furthermore, it is not anticipated that the patient will be medically stable for discharge from the hospital within 2 midnights of admission. The following factors support the patient status of inpatient.   " The patient's presenting symptoms include shortness of breath, lower extremity edema, and confusion. " The worrisome physical exam findings include significant bilateral lower extremity edema. " The initial radiographic and laboratory data are worrisome because of Na 114. BNP elevated. " The chronic co-morbidities include HTN, Multivessel CAD, acute systolic CHF, tobacco abuse.   * I certify that at the point of admission it is my clinical judgment that the patient will require inpatient hospital care spanning beyond 2 midnights from the point of admission due to high intensity of service, high risk for further deterioration and  high frequency of surveillance required.*    For questions or updates, please contact [facility] Please consult [email or web address] for contact info under        Signed, Beatriz StallionKrista M. Kroeger, PA-C  11/06/2018 3:44 PM

## 2018-11-07 DIAGNOSIS — I5021 Acute systolic (congestive) heart failure: Secondary | ICD-10-CM

## 2018-11-07 LAB — BASIC METABOLIC PANEL
Anion gap: 14 (ref 5–15)
Anion gap: 13 (ref 5–15)
Anion gap: 8 (ref 5–15)
BUN: 7 mg/dL — ABNORMAL LOW (ref 8–23)
BUN: 5 mg/dL — ABNORMAL LOW (ref 8–23)
BUN: 6 mg/dL — ABNORMAL LOW (ref 8–23)
CO2: 20 mmol/L — ABNORMAL LOW (ref 22–32)
CO2: 23 mmol/L (ref 22–32)
CO2: 26 mmol/L (ref 22–32)
CREATININE: 0.83 mg/dL (ref 0.61–1.24)
CREATININE: 0.93 mg/dL (ref 0.61–1.24)
Calcium: 8.5 mg/dL — ABNORMAL LOW (ref 8.9–10.3)
Calcium: 8.8 mg/dL — ABNORMAL LOW (ref 8.9–10.3)
Calcium: 8.9 mg/dL (ref 8.9–10.3)
Chloride: 86 mmol/L — ABNORMAL LOW (ref 98–111)
Chloride: 88 mmol/L — ABNORMAL LOW (ref 98–111)
Chloride: 89 mmol/L — ABNORMAL LOW (ref 98–111)
Creatinine, Ser: 0.81 mg/dL (ref 0.61–1.24)
GFR calc Af Amer: >60 mL/min (ref >60)
GFR calc non Af Amer: >60 mL/min (ref >60)
GFR calc non Af Amer: >60 mL/min (ref >60)
GFR calc non Af Amer: >60 mL/min (ref >60)
GLUCOSE: 90 mg/dL (ref 70–99)
Glucose, Bld: 113 mg/dL — ABNORMAL HIGH (ref 70–99)
Glucose, Bld: 124 mg/dL — ABNORMAL HIGH (ref 70–99)
Potassium: 3.8 mmol/L (ref 3.5–5.1)
Potassium: 3.4 mmol/L — ABNORMAL LOW (ref 3.5–5.1)
Potassium: 3.4 mmol/L — ABNORMAL LOW (ref 3.5–5.1)
Sodium: 120 mmol/L — ABNORMAL LOW (ref 135–145)
Sodium: 124 mmol/L — ABNORMAL LOW (ref 135–145)
Sodium: 123 mmol/L — ABNORMAL LOW (ref 135–145)

## 2018-11-07 NOTE — Progress Notes (Signed)
Pt refused foley catheter placement. Pt is voiding adequately.

## 2018-11-07 NOTE — Progress Notes (Signed)
Progress Note  Patient Name: David Mack Date of Encounter: 11/07/2018  Primary Cardiologist: No primary care provider on file. New ([place])  Subjective   Excellent diuresis, -3.2L net diuresis. Na improving. Feeling better, but breathing is not back to baseline.  Inpatient Medications    Scheduled Meds: . aspirin EC  81 mg Oral Daily  . dextromethorphan-guaiFENesin  1 tablet Oral BID  . enoxaparin (LOVENOX) injection  40 mg Subcutaneous Q24H  . losartan  25 mg Oral Daily  . mometasone-formoterol  2 puff Inhalation BID  . nicotine  21 mg Transdermal Daily  . rosuvastatin  20 mg Oral QPM  . sodium chloride flush  3 mL Intravenous Q12H   Continuous Infusions: . sodium chloride    . furosemide (LASIX) infusion 4 mg/hr (11/06/18 2110)   PRN Meds: sodium chloride, acetaminophen, ondansetron (ZOFRAN) IV, sodium chloride flush   Vital Signs    Vitals:   11/07/18 0422 11/07/18 0443 11/07/18 0738 11/07/18 0942  BP: 108/65  117/65   Pulse: (!) 57 (!) 59 70 66  Resp: 15 17 17 18   Temp: 97.9 F (36.6 C)  98 F (36.7 C)   TempSrc: Oral  Oral   SpO2: 97% 97% 96% 97%  Weight:  91.2 kg    Height:        Intake/Output Summary (Last 24 hours) at 11/07/2018 1105 Last data filed at 11/07/2018 0700 Gross per 24 hour  Intake 120 ml  Output 3300 ml  Net -3180 ml   Filed Weights   11/06/18 2100 11/07/18 0443  Weight: 92.1 kg 91.2 kg    Telemetry    Accelerated junctional rhythm- Personally Reviewed  ECG    Probably accelerated junctional rhythm, pulmonary disease pattern - Personally Reviewed  Physical Exam  Breathing very comfortably while sitting up GEN: No acute distress.   Neck: No JVD Cardiac: RRR, no murmurs, rubs, or gallops.  Respiratory: Clear to auscultation bilaterally. GI: Soft, nontender, non-distended  MS: No edema; No deformity. Neuro:  Nonfocal  Psych: Normal affect   Labs    Chemistry Recent Labs  Lab 11/06/18 1206 11/06/18 1927  11/07/18 0253  NA 114* 117* 120*  K 4.1 4.0 3.8  CL 82* 83* 86*  CO2 20* 24 20*  GLUCOSE 112* 100* 90  BUN 5* <5* 7*  CREATININE 0.83 0.82 0.81  CALCIUM 8.6* 8.6* 8.5*  GFRNONAA >60 >60 >60  GFRAA >60 >60 >60  ANIONGAP 12 10 14      Hematology Recent Labs  Lab 11/06/18 1206 11/06/18 1927  WBC 8.2 9.3  RBC 3.88* 3.76*  HGB 13.2 12.6*  HCT 35.2* 33.9*  MCV 90.7 90.2  MCH 34.0 33.5  MCHC 37.5* 37.2*  RDW 11.5 11.4*  PLT 312 320    Cardiac EnzymesNo results for input(s): TROPONINI in the last 168 hours.  Recent Labs  Lab 11/06/18 1213  TROPIPOC 0.02     BNP Recent Labs  Lab 11/06/18 1206  BNP 989.9*     DDimer No results for input(s): DDIMER in the last 168 hours.   Radiology    Dg Chest 2 View  Result Date: 11/06/2018 CLINICAL DATA:  2-3 week history of chest pain and shortness of breath that acutely worsened today. Current history of hypertension. Current smoker. Prior MI. EXAM: CHEST - 2 VIEW COMPARISON:  10/26/2018. FINDINGS: AP ERECT and LATERAL images were obtained. Cardiac silhouette moderately enlarged, unchanged when allowing for differences in technique. Thoracic aorta atherosclerotic, unchanged. Hilar and mediastinal contours  otherwise unremarkable. Mild pulmonary venous hypertension without overt edema. Prominent bronchovascular markings diffusely and central peribronchial thickening, unchanged. Small BILATERAL pleural effusions best visualized on the LATERAL image with associated mild atelectasis in the lower lobes. Lungs otherwise clear. Remote healed fracture involving the RIGHT clavicle. Degenerative changes throughout the thoracic spine with ANTERIOR wedge compression deformities of multiple thoracic vertebrae, unchanged. IMPRESSION: 1. Small BILATERAL pleural effusions with associated mild passive atelectasis in the lower lobes. No acute cardiopulmonary disease otherwise. 2. Stable moderate cardiomegaly without pulmonary edema. 3. Stable moderate  changes of chronic bronchitis and/or asthma. Electronically Signed   By: Hulan Saashomas  Lawrence M.D.   On: 11/06/2018 13:00    Cardiac Studies   Left heart catheterization 10/30/18: FINDINGS/RECOMMENDATIONS:  1. Severe fluoroscopic coronary calcification.  2. LMT distal 25%. LAD heavily calcified prox 80% and mid focal 50%. Long D1 has ostial 80% stenosis. Cx prox tandem heavily calcified 80% and 70% stenoses. Long OM2 has prox 40% plaque. Dom RCA prox 99% followed by mid 100%. Mid to distal segments have good collaterals from left.  Gave pt and wife pics and updated referring cardiologist (Dr Faylene MillionYeboah). Urged to stop smoking.  Echocardiogram 10/28/18: SUMMARY The left ventricle is moderately dilated. There is normal left ventricular wall thickness.  Left ventricular systolic function is severely reduced. LV ejection fraction = 25-30%. Only the basal segments show significant contractile function, except for the  basal inferolateral segment which is also contractile. The mid and apical  segments are severly hypokinetic to akinetic.  Left ventricular filling pattern is pseudonormal. The right ventricle is mildly dilated. The left atrium is severely dilated. The right atrium is moderately dilated. There is tenting of the mitral valve leaflets. There is moderate mitral regurgitation. There is mild tricuspid regurgitation. Moderate pulmonary hypertension. Estimated right ventricular systolic  pressure is 64 mmHg. Mildly dilated ascending aorta. (3.7cm) There is no pericardial effusion. There is no comparison study available.  Patient Profile     72 y.o. male with multivessel CAD, severe left ventricular systolic failure, acutely decompensated, severe hyponatremia, moderate to severe pulmonary artery hypertension, ectopic atrial/junctional rhythm  Assessment & Plan    1. CHF: Renal function and sodium level are improving with diuresis.  Symptoms improving.  Do not hear a murmur on  his physical exam suggesting that his tricuspid regurgitation may also be better.  Continue diuretics.  We will try to increase his ARB as blood pressure allows, [facility].  I think it still premature to discuss beta-blockers. 2. CAD: 5 vessel disease, not a candidate for PCI.  He is not experiencing angina pectoris.  Considering CABG if there is significant viability on planned MRI next week. 3. HypoNa: Proving at the desired pace.  Mental status is already improved.  Avoid rapid correction.  Would restriction and continue diuretics. 4.  Ongoing tobacco abuse and suspected OSA  For questions or updates, please contact [facility] Please consult [email or web address] for contact info under        Signed, Thurmon FairMihai Calista Crain, MD  11/07/2018, 11:05 AM

## 2018-11-08 DIAGNOSIS — I25118 Atherosclerotic heart disease of native coronary artery with other forms of angina pectoris: Secondary | ICD-10-CM

## 2018-11-08 DIAGNOSIS — Z720 Tobacco use: Secondary | ICD-10-CM

## 2018-11-08 DIAGNOSIS — E876 Hypokalemia: Secondary | ICD-10-CM

## 2018-11-08 LAB — BASIC METABOLIC PANEL
Anion gap: 11 (ref 5–15)
BUN: 5 mg/dL — ABNORMAL LOW (ref 8–23)
CO2: 24 mmol/L (ref 22–32)
Calcium: 8.6 mg/dL — ABNORMAL LOW (ref 8.9–10.3)
Chloride: 90 mmol/L — ABNORMAL LOW (ref 98–111)
Creatinine, Ser: 0.82 mg/dL (ref 0.61–1.24)
GFR calc Af Amer: >60 mL/min (ref >60)
GFR calc non Af Amer: >60 mL/min (ref >60)
Glucose, Bld: 117 mg/dL — ABNORMAL HIGH (ref 70–99)
Potassium: 3.3 mmol/L — ABNORMAL LOW (ref 3.5–5.1)
Sodium: 125 mmol/L — ABNORMAL LOW (ref 135–145)

## 2018-11-08 MED ORDER — LOSARTAN POTASSIUM 50 MG PO TABS
50.0000 mg | ORAL_TABLET | Freq: Every day | ORAL | Status: DC
  Administered 2018-11-09 – 2018-11-12 (×4): 50 mg via ORAL
  Filled 2018-11-08 (×4): qty 1

## 2018-11-08 MED ORDER — POTASSIUM CHLORIDE CRYS ER 20 MEQ PO TBCR
20.0000 meq | EXTENDED_RELEASE_TABLET | Freq: Three times a day (TID) | ORAL | Status: AC
  Administered 2018-11-08 – 2018-11-09 (×6): 20 meq via ORAL
  Filled 2018-11-08 (×5): qty 1

## 2018-11-08 NOTE — Progress Notes (Signed)
Progress Note  Patient Name: David Mack Date of Encounter: 11/08/2018  Primary Cardiologist: Chrystie NoseKenneth C Hilty, MD   Subjective   Net diuresis almost 5 L.  Weight down 4.5 kg since admission. Sodium continues improving trend, albeit slowly.  Mild hypokalemia.  Normal creatinine level.  Inpatient Medications    Scheduled Meds: . aspirin EC  81 mg Oral Daily  . dextromethorphan-guaiFENesin  1 tablet Oral BID  . enoxaparin (LOVENOX) injection  40 mg Subcutaneous Q24H  . losartan  25 mg Oral Daily  . mometasone-formoterol  2 puff Inhalation BID  . nicotine  21 mg Transdermal Daily  . rosuvastatin  20 mg Oral QPM  . sodium chloride flush  3 mL Intravenous Q12H   Continuous Infusions: . sodium chloride    . furosemide (LASIX) infusion 4 mg/hr (11/08/18 0600)   PRN Meds: sodium chloride, acetaminophen, ondansetron (ZOFRAN) IV, sodium chloride flush   Vital Signs    Vitals:   11/08/18 0503 11/08/18 0812 11/08/18 0840 11/08/18 0959  BP:  114/69  (!) 109/55  Pulse:  68 68   Resp:  20 20 (!) 21  Temp:  97.7 F (36.5 C)    TempSrc:  Oral    SpO2:  95% 96%   Weight: 87.5 kg     Height:        Intake/Output Summary (Last 24 hours) at 11/08/2018 1124 Last data filed at 11/08/2018 0812 Gross per 24 hour  Intake 251.18 ml  Output 1775 ml  Net -1523.82 ml   Filed Weights   11/06/18 2100 11/07/18 0443 11/08/18 0503  Weight: 92.1 kg 91.2 kg 87.5 kg    Telemetry    Sinus rhythm.- Personally Reviewed  ECG    No new tracings- Personally Reviewed  Physical Exam  Alert, oriented. GEN: No acute distress.   Neck:  4-6 cm JVD Cardiac: RRR, no murmurs, rubs, or gallops.  Respiratory: Clear to auscultation bilaterally. GI: Soft, nontender, non-distended  MS:  Substantial loss of fluid with wrinkling of the skin in both legs, residual 1+ ankle and pedal symmetrical edema; No deformity. Neuro:  Nonfocal  Psych: Normal affect   Labs    Chemistry Recent Labs  Lab  11/07/18 1154 11/07/18 2008 11/08/18 0406  NA 124* 123* 125*  K 3.4* 3.4* 3.3*  CL 88* 89* 90*  CO2 23 26 24   GLUCOSE 113* 124* 117*  BUN 5* 6* 5*  CREATININE 0.83 0.93 0.82  CALCIUM 8.8* 8.9 8.6*  GFRNONAA >60 >60 >60  GFRAA >60 >60 >60  ANIONGAP 13 8 11      Hematology Recent Labs  Lab 11/06/18 1206 11/06/18 1927  WBC 8.2 9.3  RBC 3.88* 3.76*  HGB 13.2 12.6*  HCT 35.2* 33.9*  MCV 90.7 90.2  MCH 34.0 33.5  MCHC 37.5* 37.2*  RDW 11.5 11.4*  PLT 312 320    Cardiac EnzymesNo results for input(s): TROPONINI in the last 168 hours.  Recent Labs  Lab 11/06/18 1213  TROPIPOC 0.02     BNP Recent Labs  Lab 11/06/18 1206  BNP 989.9*     DDimer No results for input(s): DDIMER in the last 168 hours.   Radiology    Dg Chest 2 View  Result Date: 11/06/2018 CLINICAL DATA:  2-3 week history of chest pain and shortness of breath that acutely worsened today. Current history of hypertension. Current smoker. Prior MI. EXAM: CHEST - 2 VIEW COMPARISON:  10/26/2018. FINDINGS: AP ERECT and LATERAL images were obtained. Cardiac silhouette  moderately enlarged, unchanged when allowing for differences in technique. Thoracic aorta atherosclerotic, unchanged. Hilar and mediastinal contours otherwise unremarkable. Mild pulmonary venous hypertension without overt edema. Prominent bronchovascular markings diffusely and central peribronchial thickening, unchanged. Small BILATERAL pleural effusions best visualized on the LATERAL image with associated mild atelectasis in the lower lobes. Lungs otherwise clear. Remote healed fracture involving the RIGHT clavicle. Degenerative changes throughout the thoracic spine with ANTERIOR wedge compression deformities of multiple thoracic vertebrae, unchanged. IMPRESSION: 1. Small BILATERAL pleural effusions with associated mild passive atelectasis in the lower lobes. No acute cardiopulmonary disease otherwise. 2. Stable moderate cardiomegaly without pulmonary  edema. 3. Stable moderate changes of chronic bronchitis and/or asthma. Electronically Signed   By: Hulan Saashomas  Lawrence M.D.   On: 11/06/2018 13:00    Cardiac Studies   Left heart catheterization 10/30/18: FINDINGS/RECOMMENDATIONS:  1. Severe fluoroscopic coronary calcification.  2. LMT distal 25%. LAD heavily calcified prox 80% and mid focal 50%. Long D1 has ostial 80% stenosis. Cx prox tandem heavily calcified 80% and 70% stenoses. Long OM2 has prox 40% plaque. Dom RCA prox 99% followed by mid 100%. Mid to distal segments have good collaterals from left.  Gave pt and wife pics and updated referring cardiologist (Dr Faylene MillionYeboah). Urged to stop smoking.  Echocardiogram 10/28/18: SUMMARY The left ventricle is moderately dilated. There is normal left ventricular wall thickness.  Left ventricular systolic function is severely reduced. LV ejection fraction = 25-30%. Only the basal segments show significant contractile function, except for the  basal inferolateral segment which is also contractile. The mid and apical  segments are severly hypokinetic to akinetic.  Left ventricular filling pattern is pseudonormal. The right ventricle is mildly dilated. The left atrium is severely dilated. The right atrium is moderately dilated. There is tenting of the mitral valve leaflets. There is moderate mitral regurgitation. There is mild tricuspid regurgitation. Moderate pulmonary hypertension. Estimated right ventricular systolic  pressure is 64 mmHg. Mildly dilated ascending aorta. (3.7cm) There is no pericardial effusion. There is no comparison study available.   Patient Profile     72 y.o. male with multivessel CAD, severe left ventricular systolic failure, acutely decompensated, severe hyponatremia, moderate to severe pulmonary artery hypertension, ectopic atrial/junctional rhythm  Assessment & Plan    1. CHF: Renal function and sodium level are improving with diuresis.  Continue  diuretic infusion another 24 hours.    Increase losartan to 50 mg daily.  Eventually if blood pressure allows [facility]  He is still not ready for beta-blockers.  I do not hear systolic murmur, suggesting that the tricuspid regurgitation was volume related and has improved. 2. CAD: 5 vessel disease, not a candidate for PCI.  He is not experiencing angina pectoris.    Schedule MRI with late gadolinium enhancement for viability/scar tomorrow, once we stop his furosemide infusion.  If there is substantial viability refer for CABG. 3. HypoNa:  Encephalopathy has resolved.  Improving very slowly so I do not think we need to check labs 3 times a day anymore.  Change to daily metabolic panel. 4. Hypokalemia: Potassium supplement.   5. Ongoing tobacco abuse and suspected OSA  For questions or updates, please contact [facility] Please consult [email or web address] for contact info under        Signed, Thurmon FairMihai Felipa Laroche, MD  11/08/2018, 11:24 AM

## 2018-11-09 ENCOUNTER — Inpatient Hospital Stay (HOSPITAL_COMMUNITY): Payer: Medicare Other

## 2018-11-09 ENCOUNTER — Other Ambulatory Visit: Payer: Self-pay

## 2018-11-09 ENCOUNTER — Ambulatory Visit: Payer: PRIVATE HEALTH INSURANCE | Admitting: Cardiology

## 2018-11-09 DIAGNOSIS — I5021 Acute systolic (congestive) heart failure: Secondary | ICD-10-CM

## 2018-11-09 LAB — BASIC METABOLIC PANEL
Anion gap: 9 (ref 5–15)
BUN: 6 mg/dL — ABNORMAL LOW (ref 8–23)
CO2: 24 mmol/L (ref 22–32)
Calcium: 8.6 mg/dL — ABNORMAL LOW (ref 8.9–10.3)
Chloride: 93 mmol/L — ABNORMAL LOW (ref 98–111)
Creatinine, Ser: 0.79 mg/dL (ref 0.61–1.24)
GFR calc Af Amer: >60 mL/min (ref >60)
Glucose, Bld: 120 mg/dL — ABNORMAL HIGH (ref 70–99)
Potassium: 3.5 mmol/L (ref 3.5–5.1)
Sodium: 126 mmol/L — ABNORMAL LOW (ref 135–145)

## 2018-11-09 MED ORDER — GADOBUTROL 1 MMOL/ML IV SOLN
9.0000 mL | Freq: Once | INTRAVENOUS | Status: AC | PRN
  Administered 2018-11-09: 9 mL via INTRAVENOUS

## 2018-11-09 MED ORDER — ALPRAZOLAM 0.25 MG PO TABS
0.2500 mg | ORAL_TABLET | Freq: Once | ORAL | Status: AC
  Administered 2018-11-09: 0.25 mg via ORAL
  Filled 2018-11-09: qty 1

## 2018-11-09 NOTE — Care Management Important Message (Signed)
Important Message  Patient Details  Name: David ManeRonald W Wist MRN: [id number] Date of Birth: 01/19/1946   Medicare Important Message Given:  Yes    Sophira Rumler P Shania Bjelland 11/09/2018, 3:25 PM

## 2018-11-09 NOTE — Progress Notes (Signed)
Progress Note  Patient Name: David Mack Date of Encounter: 11/09/2018  Primary Cardiologist:   Chrystie NoseKenneth C Hilty, MD   Subjective   Feels much better.  No chest pain.  Breathing much improved.  Mentation back to baseline.    Inpatient Medications    Scheduled Meds: . aspirin EC  81 mg Oral Daily  . dextromethorphan-guaiFENesin  1 tablet Oral BID  . enoxaparin (LOVENOX) injection  40 mg Subcutaneous Q24H  . losartan  50 mg Oral Daily  . mometasone-formoterol  2 puff Inhalation BID  . nicotine  21 mg Transdermal Daily  . potassium chloride  20 mEq Oral TID  . rosuvastatin  20 mg Oral QPM  . sodium chloride flush  3 mL Intravenous Q12H   Continuous Infusions: . sodium chloride    . furosemide (LASIX) infusion 4 mg/hr (11/08/18 2000)   PRN Meds: sodium chloride, acetaminophen, ondansetron (ZOFRAN) IV, sodium chloride flush   Vital Signs    Vitals:   11/08/18 1932 11/08/18 2035 11/09/18 0028 11/09/18 0357  BP: (!) 104/51  (!) 94/50 107/61  Pulse: 74  65 70  Resp: (!) 22  14 17   Temp: 98.2 F (36.8 C)  97.8 F (36.6 C) (!) 97.5 F (36.4 C)  TempSrc: Oral  Oral Oral  SpO2: 100% 99% 98% 99%  Weight:    87.5 kg  Height:        Intake/Output Summary (Last 24 hours) at 11/09/2018 0807 Last data filed at 11/08/2018 2000 Gross per 24 hour  Intake 55.99 ml  Output 850 ml  Net -794.01 ml   Filed Weights   11/07/18 0443 11/08/18 0503 11/09/18 0357  Weight: 91.2 kg 87.5 kg 87.5 kg    Telemetry    NSR, atrial tach - Personally Reviewed  ECG    NA - Personally Reviewed  Physical Exam   GEN: No acute distress.   Neck: No  JVD Cardiac: RRR, no murmurs, rubs, or gallops.  Respiratory: Clear  to auscultation bilaterally. GI: Soft, nontender, non-distended  MS: No edema; No deformity. Neuro:  Nonfocal  Psych: Normal affect   Labs    Chemistry Recent Labs  Lab 11/07/18 2008 11/08/18 0406 11/09/18 0246  NA 123* 125* 126*  K 3.4* 3.3* 3.5  CL 89*  90* 93*  CO2 26 24 24   GLUCOSE 124* 117* 120*  BUN 6* 5* 6*  CREATININE 0.93 0.82 0.79  CALCIUM 8.9 8.6* 8.6*  GFRNONAA >60 >60 >60  GFRAA >60 >60 >60  ANIONGAP 8 11 9      Hematology Recent Labs  Lab 11/06/18 1206 11/06/18 1927  WBC 8.2 9.3  RBC 3.88* 3.76*  HGB 13.2 12.6*  HCT 35.2* 33.9*  MCV 90.7 90.2  MCH 34.0 33.5  MCHC 37.5* 37.2*  RDW 11.5 11.4*  PLT 312 320    Cardiac EnzymesNo results for input(s): TROPONINI in the last 168 hours.  Recent Labs  Lab 11/06/18 1213  TROPIPOC 0.02     BNP Recent Labs  Lab 11/06/18 1206  BNP 989.9*     DDimer No results for input(s): DDIMER in the last 168 hours.   Radiology    No results found.  Cardiac Studies   NA  Patient Profile     72 y.o. male with multivessel CAD, severe left ventricular systolic failure, acutely decompensated, severe hyponatremia, moderate to severe pulmonary artery hypertension, ectopic atrial/junctional rhythm  Assessment & Plan    ACUTE SYSTOLIC HF:    Net negative 5.2 liters  since admission.  I will continue the IV diuresis today.    CAD:  Plan MRI for viability.  Note, IV Lasix can be held when he goes down for this study and then resumed afterward. Marland Kitchen.    HYPONATREMIA:    Mildly improved.  Continue free water restriction with meds as above and we will continue to follow.    TOBACCO ABUSE:  Continuing to educate.      For questions or updates, please contact [facility] Please consult [email or web address] for contact info under Cardiology/STEMI.   Signed, Rollene RotundaJames Jakota Manthei, MD  11/09/2018, 8:07 AM  \

## 2018-11-10 ENCOUNTER — Encounter (HOSPITAL_COMMUNITY): Payer: PRIVATE HEALTH INSURANCE

## 2018-11-10 ENCOUNTER — Encounter (HOSPITAL_COMMUNITY): Payer: Self-pay | Admitting: General Practice

## 2018-11-10 ENCOUNTER — Inpatient Hospital Stay (HOSPITAL_COMMUNITY): Payer: Medicare Other

## 2018-11-10 ENCOUNTER — Other Ambulatory Visit: Payer: Self-pay | Admitting: *Deleted

## 2018-11-10 ENCOUNTER — Other Ambulatory Visit (HOSPITAL_COMMUNITY): Payer: PRIVATE HEALTH INSURANCE

## 2018-11-10 DIAGNOSIS — I340 Nonrheumatic mitral (valve) insufficiency: Secondary | ICD-10-CM

## 2018-11-10 DIAGNOSIS — I2510 Atherosclerotic heart disease of native coronary artery without angina pectoris: Secondary | ICD-10-CM

## 2018-11-10 DIAGNOSIS — J449 Chronic obstructive pulmonary disease, unspecified: Secondary | ICD-10-CM

## 2018-11-10 DIAGNOSIS — I255 Ischemic cardiomyopathy: Secondary | ICD-10-CM

## 2018-11-10 LAB — URINALYSIS, ROUTINE W REFLEX MICROSCOPIC
Bilirubin Urine: NEGATIVE
Glucose, UA: NEGATIVE mg/dL
Hgb urine dipstick: NEGATIVE
Ketones, ur: NEGATIVE mg/dL
Leukocytes, UA: NEGATIVE
Nitrite: NEGATIVE
Protein, ur: NEGATIVE mg/dL
Specific Gravity, Urine: 1.008 (ref 1.005–1.030)
pH: 7 (ref 5.0–8.0)

## 2018-11-10 LAB — ECHOCARDIOGRAM COMPLETE
Height: 66 in
Weight: 3067.2 oz

## 2018-11-10 LAB — BASIC METABOLIC PANEL
Anion gap: 10 (ref 5–15)
BUN: 7 mg/dL — AB (ref 8–23)
CO2: 24 mmol/L (ref 22–32)
Calcium: 8.9 mg/dL (ref 8.9–10.3)
Chloride: 94 mmol/L — ABNORMAL LOW (ref 98–111)
Creatinine, Ser: 0.9 mg/dL (ref 0.61–1.24)
GFR calc Af Amer: >60 mL/min (ref >60)
Glucose, Bld: 105 mg/dL — ABNORMAL HIGH (ref 70–99)
Potassium: 4.4 mmol/L (ref 3.5–5.1)
Sodium: 128 mmol/L — ABNORMAL LOW (ref 135–145)

## 2018-11-10 LAB — PULMONARY FUNCTION TEST
FEF 25-75 Pre: 0.91 L/sec
FEF2575-%Pred-Pre: 44 %
FEV1-%Pred-Pre: 63 %
FEV1-Pre: 1.74 L
FEV1FVC-%Pred-Pre: 87 %
FEV6-%Pred-Pre: 76 %
FEV6-Pre: 2.66 L
FEV6FVC-%Pred-Pre: 104 %
FVC-%Pred-Pre: 72 %
FVC-Pre: 2.73 L
Pre FEV1/FVC ratio: 64 %
Pre FEV6/FVC Ratio: 98 %

## 2018-11-10 LAB — SURGICAL PCR SCREEN
MRSA, PCR: NEGATIVE
Staphylococcus aureus: NEGATIVE

## 2018-11-10 MED ORDER — PERFLUTREN LIPID MICROSPHERE
INTRAVENOUS | Status: AC
  Administered 2018-11-10: 11:00:00
  Filled 2018-11-10: qty 10

## 2018-11-10 MED ORDER — FUROSEMIDE 10 MG/ML IJ SOLN
40.0000 mg | Freq: Every day | INTRAMUSCULAR | Status: DC
  Administered 2018-11-10 – 2018-11-12 (×3): 40 mg via INTRAVENOUS
  Filled 2018-11-10 (×3): qty 4

## 2018-11-10 MED ORDER — IOHEXOL 300 MG/ML  SOLN
75.0000 mL | Freq: Once | INTRAMUSCULAR | Status: AC | PRN
  Administered 2018-11-10: 75 mL via INTRAVENOUS

## 2018-11-10 MED ORDER — PERFLUTREN LIPID MICROSPHERE
INTRAVENOUS | Status: AC
  Filled 2018-11-10: qty 10

## 2018-11-10 MED ORDER — PERFLUTREN LIPID MICROSPHERE
1.0000 mL | INTRAVENOUS | Status: AC | PRN
  Administered 2018-11-10: 2 mL via INTRAVENOUS
  Filled 2018-11-10: qty 10

## 2018-11-10 NOTE — Consult Note (Signed)
[facility] CM Primary Care Navigator  11/10/2018  David Mack 1946-01-30 [id number]   Met with patient at the bedsideto identify possible discharge needs.  Patientreportshaving"chest pain, shortness of breath and swelling to bilateral legs"that led him back to the hospital and resulted to this admission. (Ischemic cardiomyopathy EF 25% with three-vessel CAD- coronary artery disease, acute systolic heart failure, hyponatremia, COPD with active smoking, moderate ischemic mitral regurgitation)   PatientendorsesDr. Stoney Bang with [facility] as hisprimary care provider.   [facility] [place] to obtainmedications withoutdifficulty.  Patient states that wifehas beenassisting him manage his medications at home using"pill box" systemevery week.  Patientreports that he has been driving prior to admission but wife is able to provide transportation to hisdoctors' appointments if needed after discharge.  [place] home with wife who serves as his primary caregiver.  Anticipated discharge plan is home when improved per patient.  Patientvoiced understanding to call primary care provider's office whenhegets home for a post discharge follow-up appointment within1- 2 weeksor sooner if needs arise.Patient letter (with PCP's contact number) was provided asareminder.   Explained topatientregarding[facility] CM services available for health management andresourcesat home but he states that his wife is a retired Marine scientist and has been assisting him to manage his health conditions so far. Hedeniedneedfor servicesat this point and states being aware of ways to manage health needs. Patient mentioned having a weighing scale at home that he uses to monitor weight and is aware of low salt diet as well as following up with providers when needed. Patient  hadpolitely declined [facility]-CM services offered which includes EMMIcalls to follow-up withhisrecovery at home.  Patientwas encouragedto seekreferral to [facility] care management fromproviderifdeemed necessary and appropriate forany services in thenearfuture.   [facility] care management information provided for future needs thatpatientmay have.   For additional questions please contact:  Edwena Felty A. Simara Rhyner, BSN, RN-BC [facility] PRIMARY CARE Navigator Cell: [phone]

## 2018-11-10 NOTE — Progress Notes (Signed)
Progress Note  Patient Name: David Mack Date of Encounter: 11/10/2018  Primary Cardiologist:   Chrystie NoseKenneth C Hilty, MD   Subjective   Feels OK.  No acute pain.  Breathing is better but not quite back to baseline.   Inpatient Medications    Scheduled Meds: . aspirin EC  81 mg Oral Daily  . dextromethorphan-guaiFENesin  1 tablet Oral BID  . enoxaparin (LOVENOX) injection  40 mg Subcutaneous Q24H  . losartan  50 mg Oral Daily  . mometasone-formoterol  2 puff Inhalation BID  . nicotine  21 mg Transdermal Daily  . rosuvastatin  20 mg Oral QPM  . sodium chloride flush  3 mL Intravenous Q12H   Continuous Infusions: . sodium chloride    . furosemide (LASIX) infusion 4 mg/hr (11/10/18 0400)   PRN Meds: sodium chloride, acetaminophen, ondansetron (ZOFRAN) IV, sodium chloride flush   Vital Signs    Vitals:   11/10/18 0500 11/10/18 0600 11/10/18 0700 11/10/18 0800  BP:      Pulse: (!) 139   (!) 146  Resp: 15 19 (!) 22 15  Temp:      TempSrc:      SpO2: (!) 84%   (!) 86%  Weight:      Height:        Intake/Output Summary (Last 24 hours) at 11/10/2018 0902 Last data filed at 11/10/2018 0730 Gross per 24 hour  Intake 1075.8 ml  Output 2225 ml  Net -1149.2 ml   Filed Weights   11/08/18 0503 11/09/18 0357 11/10/18 0342  Weight: 87.5 kg 87.5 kg 87 kg    Telemetry    NSR, atrial tach - Personally Reviewed  ECG    NA - Personally Reviewed  Physical Exam   GEN: No acute distress.   Neck:   No JVD Cardiac: RRR, no murmurs, rubs, or gallops.  Respiratory:   Few basilar crackles GI: Soft, nontender, non-distended  MS: No edema; No deformity. Neuro:  Nonfocal  Psych: Normal affect   Labs    Chemistry Recent Labs  Lab 11/08/18 0406 11/09/18 0246 11/10/18 0322  NA 125* 126* 128*  K 3.3* 3.5 4.4  CL 90* 93* 94*  CO2 24 24 24   GLUCOSE 117* 120* 105*  BUN 5* 6* 7*  CREATININE 0.82 0.79 0.90  CALCIUM 8.6* 8.6* 8.9  GFRNONAA >60 >60 >60  GFRAA >60 >60  >60  ANIONGAP 11 9 10      Hematology Recent Labs  Lab 11/06/18 1206 11/06/18 1927  WBC 8.2 9.3  RBC 3.88* 3.76*  HGB 13.2 12.6*  HCT 35.2* 33.9*  MCV 90.7 90.2  MCH 34.0 33.5  MCHC 37.5* 37.2*  RDW 11.5 11.4*  PLT 312 320    Cardiac EnzymesNo results for input(s): TROPONINI in the last 168 hours.  Recent Labs  Lab 11/06/18 1213  TROPIPOC 0.02     BNP Recent Labs  Lab 11/06/18 1206  BNP 989.9*     DDimer No results for input(s): DDIMER in the last 168 hours.   Radiology    No results found.  Cardiac Studies   NA  Patient Profile     72 y.o. male with multivessel CAD, severe left ventricular systolic failure, acutely decompensated, severe hyponatremia, moderate to severe pulmonary artery hypertension, ectopic atrial/junctional rhythm  Assessment & Plan    ACUTE SYSTOLIC HF:    Net negative 6.3 liters since admission. I will change to IV bid Lasix for today.  I will repeat an echocardiogram since  I do not have these images.  EF was 30% on MRI with moderate MR.   CAD: MRI viability with positive viability.  Discussed with Dr. Shirlee LatchMcLean.    HYPONATREMIA:    Continues to improve.    TOBACCO ABUSE:  Educated.       For questions or updates, please contact [facility] Please consult [email or web address] for contact info under Cardiology/STEMI.   Signed, Rollene RotundaJames Lewin Pellow, MD  11/10/2018, 9:02 AM  \

## 2018-11-10 NOTE — Progress Notes (Signed)
  Echocardiogram 2D Echocardiogram with definity has been performed.  Leta JunglingCooper, Komal Stangelo M 11/10/2018, 11:55 AM

## 2018-11-10 NOTE — Consult Note (Signed)
[address]       [place] [id number]             [phone]        Patria ManeRonald W Saldivar [facility] Medical Record #[id number] Date of Birth: 07/25/1946  Referring: No ref. provider found Primary Care: Toma DeitersHasanaj, Xaje A, MD Primary Cardiologist:Kenneth Alona Bene Hilty, MD  Chief Complaint:   Heart failure, shortness of breath edema   History of Present Illness:     72 year old nondiabetic smoker with previously diagnosed ischemic cardiomyopathy at [facility] admitted through our emergency department with worsening symptoms of heart failure.  He presented to [facility] earlier in the month with chest pain, shortness of breath, edema, decreased exercise tolerance, and non-ST elevation MI.  At [facility] he underwent left heart cath which showed three-vessel disease, images are not available at this time.  He also underwent a TEE which apparently showed moderate MR and severe LV dysfunction with EF 20-25%.  The patient was being recommended for possible surgical coronary revascularization when he left the hospital.  He subsequently gained 10 pounds in fluid and presented to our emergency department requesting care.  On presentation here he was hyponatremic with a sodium of 114.  He was in atrial flutter with 3+ edema to the thigh.  He was placed on a Lasix drip with significant diuresis and improvement in symptoms.  He underwent a MRI cardiac viability study which has not yet been officially reported but appears to have evidence of anterior viability.  I have reviewed the transthoracic echocardiogram performed at this hospital and the patient appears to have severe inferior lateral hypokinesia, 2+ moderate MR, and EF of 25%.  The patient's functional status and exercise tolerance has significantly dropped in the past 3 months.  The patient has been unable to walk more than across the room and has been unable to do any yard work or his usual activities.  He has had a fall in  the recent past with possible left rib bruises or fracture.  He continues to smoke.  There is no symptoms of TIA DVT or nausea.  He is a nondiabetic.  Current Activity/ Functional Status: Patient lives with his wife but has a poor functional status unable to ambulate outside the house without assistance   Zubrod Score: At the time of surgery this patient's most appropriate activity status/level should be described as: []     0    Normal activity, no symptoms []     1    Restricted in physical strenuous activity but ambulatory, able to do out light work []     2    Ambulatory and capable of self care, unable to do work activities, up and about                 more than 50%  Of the time                            [x]     3    Only limited self care, in bed greater than 50% of waking hours []     4    Completely disabled, no self care, confined to bed or chair []     5    Moribund  Past Medical History:  Diagnosis Date  . Hypertension     Past Surgical History:  Procedure Laterality Date  . CATARACT EXTRACTION W/PHACO Left 07/06/2015   Procedure: CATARACT  EXTRACTION PHACO AND INTRAOCULAR LENS PLACEMENT (IOC);  Surgeon: Gemma Payor, MD;  Location: [facility];  Service: Ophthalmology;  Laterality: Left;  CDE 8.33  . CATARACT EXTRACTION W/PHACO Right 08/21/2015   Procedure: CATARACT EXTRACTION PHACO AND INTRAOCULAR LENS PLACEMENT (IOC);  Surgeon: Gemma Payor, MD;  Location: [facility];  Service: Ophthalmology;  Laterality: Right;  CDE:9.89  . KNEE ARTHROSCOPY Right 2006  . KNEE ARTHROSCOPY Left 2000    Social History   Tobacco Use  Smoking Status Current Every Day Smoker  . Packs/day: 1.50  . Years: 20.00  . Pack years: 30.00  . Types: Cigarettes  Smokeless Tobacco Never Used    Social History   Substance and Sexual Activity  Alcohol Use No     Allergies  Allergen Reactions  . Albuterol Nausea And Vomiting    Dizziness - unable to stand  . Pulmicort [Budesonide] Other (See Comments)     Makes very jittery and feels worse rather than better    Current Facility-Administered Medications  Medication Dose Route Frequency Provider Last Rate Last Dose  . 0.9 %  sodium chloride infusion  250 mL Intravenous PRN Judy Pimple M., PA-C   Stopped at 11/10/18 1204  . acetaminophen (TYLENOL) tablet 650 mg  650 mg Oral Q4H PRN Beatriz Stallion., PA-C   650 mg at 11/06/18 2103  . aspirin EC tablet 81 mg  81 mg Oral Daily Judy Pimple M., PA-C   81 mg at 11/10/18 1207  . dextromethorphan-guaiFENesin (MUCINEX DM) 30-600 MG per 12 hr tablet 1 tablet  1 tablet Oral BID Chrystie Nose, MD   1 tablet at 11/10/18 1208  . enoxaparin (LOVENOX) injection 40 mg  40 mg Subcutaneous Q24H Kroeger, Krista M., PA-C   40 mg at 11/09/18 2009  . furosemide (LASIX) injection 40 mg  40 mg Intravenous Daily Rollene Rotunda, MD   40 mg at 11/10/18 1208  . losartan (COZAAR) tablet 50 mg  50 mg Oral Daily Croitoru, Mihai, MD   50 mg at 11/10/18 1207  . mometasone-formoterol (DULERA) 200-5 MCG/ACT inhaler 2 puff  2 puff Inhalation BID Judy Pimple M., PA-C   2 puff at 11/10/18 1308  . nicotine (NICODERM CQ - dosed in mg/24 hours) patch 21 mg  21 mg Transdermal Daily Judy Pimple M., PA-C   21 mg at 11/08/18 0953  . ondansetron (ZOFRAN) injection 4 mg  4 mg Intravenous Q6H PRN Riley Nearing, Ovidio Kin., PA-C      . PERFLUTREN LIPID MICROSPHERE injection SUSP           . rosuvastatin (CRESTOR) tablet 20 mg  20 mg Oral QPM Kroeger, Krista M., PA-C   20 mg at 11/09/18 1826  . sodium chloride flush (NS) 0.9 % injection 3 mL  3 mL Intravenous Q12H Kroeger, Krista M., PA-C   3 mL at 11/06/18 2103  . sodium chloride flush (NS) 0.9 % injection 3 mL  3 mL Intravenous PRN Kroeger, Krista M., PA-C        Medications Prior to Admission  Medication Sig Dispense Refill Last Dose  . amLODipine-benazepril (LOTREL) 10-20 MG per capsule Take 1 capsule by mouth daily.   11/06/2018 at Unknown time  . aspirin EC 81 MG tablet Take  81 mg by mouth daily.   11/06/2018 at Unknown time  . budesonide-formoterol (SYMBICORT) 160-4.5 MCG/ACT inhaler Inhale 1 puff into the lungs as needed (shortness of breath/wheezing).   unk  . furosemide (LASIX) 40 MG tablet Take 40 mg by  mouth every morning.   11/06/2018 at Unknown time  . guaiFENesin (MUCINEX) 600 MG 12 hr tablet Take 1,200 mg by mouth 2 (two) times daily.   11/06/2018 at AM  . metoprolol tartrate (LOPRESSOR) 25 MG tablet Take 12.5 mg by mouth 2 (two) times daily.   11/06/2018 at 1000  . nicotine (NICODERM CQ - DOSED IN MG/24 HOURS) 21 mg/24hr patch Place 21 mg onto the skin daily. Alternate shoulder   11/06/2018 at Unknown time  . nitroGLYCERIN (NITROSTAT) 0.4 MG SL tablet Place 0.4 mg under the tongue every 5 (five) minutes x 3 doses as needed for chest pain.   unk  . rosuvastatin (CRESTOR) 20 MG tablet Take 20 mg by mouth every evening.   11/05/2018 at Unknown time    Family History  Problem Relation Age of Onset  . Alzheimer's disease Mother   . Heart disease Mother        diagnosed in older age     Review of Systems:   ROS      Cardiac Review of Systems: Y or  [    ]= no  Chest Pain [  y  ]  Resting SOB [ y  ] Exertional SOB  [ y ]  David Mack[y  ]   Pedal Edema [ y  ]    Palpitations [  ] Syncope  [  ]   Presyncope [   ]  General Review of Systems: [Y] = yes [  ]=no Constitional: recent weight change [  ]; anorexia Mack[y  ]; fatigue Mack[y  ]; nausea [  ]; night sweats [  ]; fever [  ]; or chills [  ]                                                               Dental: Last Dentist visit:   Eye : blurred vision [  ]; diplopia [   ]; vision changes [  ];  Amaurosis fugax[  ]; Resp: cough [  ];  wheezing[  ];  hemoptysis[  ]; shortness of breath[  ]; paroxysmal nocturnal dyspnea[  ]; dyspnea on exertion[ y ]; or orthopnea[  ];  GI:  gallstones[  ], vomiting[  ];  dysphagia[  ]; melena[  ];  hematochezia [  ]; heartburn[  ];   Hx of  Colonoscopy[  ]; GU: kidney stones [   ]; hematuria[  ];   dysuria [  ];  nocturia[  ];  history of     obstruction [  ]; urinary frequency [  ]             Skin: rash, swelling[  ];, hair loss[  ];  peripheral edema[  ];  or itching[  ]; Musculosketetal: myalgias[  ];  joint swelling[  ];  joint erythema[  ];  joint pain[  ];  back pain[y  ];  Heme/Lymph: bruising[  ];  bleeding[  ];  anemia[  ];  Neuro: TIA[  ];  headaches[  ];  stroke[  ];  vertigo[  ];  seizures[  ];   paresthesias[  ];  difficulty walking[  ]; right-hand-dominant  Psych:depression[  ]; anxiety[  ];  Endocrine: diabetes[  ];  thyroid dysfunction[  ];  Physical Exam: BP 100/60 (BP Location: Left Arm)   Pulse (!) 142   Temp 97.8 F (36.6 C) (Oral)   Resp (!) 25   Ht 5\' 6"  (1.676 m)   Wt 87 kg   SpO2 (!) 86%   BMI 30.94 kg/m       Physical Exam  General: Elderly overweight male with signs of COPD no acute distress HEENT: Normocephalic pupils equal , dentition poor Neck: Supple without JVD, adenopathy, or bruit Chest: Clear but distant breath sounds to auscultation, symmetrical breath sounds, scattered rhonchi, no tenderness             Increased AP diameter chest wall deformity Cardiovascular: Regular rate and rhythm, no murmur, no gallop, peripheral pulses            Trace palpable in all extremities Abdomen:  Soft, nontender, no palpable mass or organomegaly Extremities: Warm, well-perfused,  mild clubbing without cyanosis.  Mild edema without tenderness,              no venous stasis changes of the legs Rectal/GU: Deferred Neuro: Grossly non--focal and symmetrical throughout Skin: Clean and dry without rash or ulceration    Diagnostic Studies & Laboratory data:     Recent Radiology Findings:  Chest x-ray image shows COPD, flattened hemidiaphragms, increased AP diameter I have independently reviewed the above radiologic studies and discussed with the patient   Recent Lab Findings: Lab Results  Component Value Date    WBC 9.3 11/06/2018   HGB 12.6 (L) 11/06/2018   HCT 33.9 (L) 11/06/2018   PLT 320 11/06/2018   GLUCOSE 105 (H) 11/10/2018   NA 128 (L) 11/10/2018   K 4.4 11/10/2018   CL 94 (L) 11/10/2018   CREATININE 0.90 11/10/2018   BUN 7 (L) 11/10/2018   CO2 24 11/10/2018      Assessment / Plan:   Ischemic cardiomyopathy EF 25% with three-vessel CAD COPD with active smoking Moderate ischemic mitral regurgitation Cardiac MRI viability reported to show viable myocardium  Patient would most likely benefit from CABG with possible mitral valve procedure but will need to view images of the angiograms before making final decision.  Patient will need right heart cath to assess his hemodynamic reserve before scheduling surgery which potentially could be Friday or early next week.  Patient needs to remain in hospital before surgery.       @ME1 @ 11/10/2018 1:13 PM

## 2018-11-11 LAB — BLOOD GAS, ARTERIAL
Acid-Base Excess: 3.3 mmol/L — ABNORMAL HIGH (ref 0.0–2.0)
Bicarbonate: 26.5 mmol/L (ref 20.0–28.0)
Drawn by: 511911
FIO2: 21
O2 Saturation: 97.2 %
Patient temperature: 98.6
pCO2 arterial: 34.6 mmHg (ref 32.0–48.0)
pH, Arterial: 7.496 — ABNORMAL HIGH (ref 7.350–7.450)
pO2, Arterial: 81.8 mmHg — ABNORMAL LOW (ref 83.0–108.0)

## 2018-11-11 LAB — COMPREHENSIVE METABOLIC PANEL
ALT: 15 U/L (ref 0–44)
AST: 21 U/L (ref 15–41)
Albumin: 3.2 g/dL — ABNORMAL LOW (ref 3.5–5.0)
Alkaline Phosphatase: 36 U/L — ABNORMAL LOW (ref 38–126)
Anion gap: 9 (ref 5–15)
BUN: 9 mg/dL (ref 8–23)
CO2: 26 mmol/L (ref 22–32)
Calcium: 9 mg/dL (ref 8.9–10.3)
Chloride: 91 mmol/L — ABNORMAL LOW (ref 98–111)
Creatinine, Ser: 0.72 mg/dL (ref 0.61–1.24)
GFR calc Af Amer: >60 mL/min (ref >60)
GFR calc non Af Amer: >60 mL/min (ref >60)
Glucose, Bld: 111 mg/dL — ABNORMAL HIGH (ref 70–99)
Potassium: 4 mmol/L (ref 3.5–5.1)
Sodium: 126 mmol/L — ABNORMAL LOW (ref 135–145)
Total Bilirubin: 0.6 mg/dL (ref 0.3–1.2)
Total Protein: 6.1 g/dL — ABNORMAL LOW (ref 6.5–8.1)

## 2018-11-11 LAB — HEMOGLOBIN A1C
Hgb A1c MFr Bld: 4.9 % (ref 4.8–5.6)
Mean Plasma Glucose: 93.93 mg/dL

## 2018-11-11 LAB — PROTIME-INR
INR: 1.06
Prothrombin Time: 13.7 seconds (ref 11.4–15.2)

## 2018-11-11 LAB — TSH: TSH: 2.98 u[IU]/mL (ref 0.350–4.500)

## 2018-11-11 MED ORDER — POLYETHYLENE GLYCOL 3350 17 G PO PACK
17.0000 g | PACK | Freq: Every day | ORAL | Status: DC | PRN
  Administered 2018-11-11: 17 g via ORAL
  Filled 2018-11-11: qty 1

## 2018-11-11 MED ORDER — DOCUSATE SODIUM 100 MG PO CAPS
100.0000 mg | ORAL_CAPSULE | Freq: Every day | ORAL | Status: DC | PRN
  Administered 2018-11-11: 100 mg via ORAL
  Filled 2018-11-11: qty 1

## 2018-11-11 MED ORDER — SODIUM CHLORIDE 0.9% FLUSH: 3.0000 mL | Freq: Two times a day (BID) | INTRAVENOUS | Status: DC

## 2018-11-11 MED ORDER — SODIUM CHLORIDE 0.9 % IV SOLN: INTRAVENOUS | Status: DC

## 2018-11-11 MED ORDER — SODIUM CHLORIDE 0.9 % IV SOLN: 250.0000 mL | INTRAVENOUS | Status: DC | PRN

## 2018-11-11 MED ORDER — SODIUM CHLORIDE 0.9% FLUSH: 3.0000 mL | INTRAVENOUS | Status: DC | PRN

## 2018-11-11 MED ORDER — ASPIRIN 81 MG PO CHEW: 81.0000 mg | CHEWABLE_TABLET | ORAL | Status: DC

## 2018-11-11 NOTE — Progress Notes (Addendum)
Progress Note  Patient Name: David Mack Date of Encounter: 11/11/2018  Primary Cardiologist:   Chrystie NoseKenneth C Hilty, MD   Subjective   No chest pain.  Breathing better but still not back to baseline.   Inpatient Medications    Scheduled Meds: . aspirin EC  81 mg Oral Daily  . dextromethorphan-guaiFENesin  1 tablet Oral BID  . enoxaparin (LOVENOX) injection  40 mg Subcutaneous Q24H  . furosemide  40 mg Intravenous Daily  . losartan  50 mg Oral Daily  . mometasone-formoterol  2 puff Inhalation BID  . nicotine  21 mg Transdermal Daily  . rosuvastatin  20 mg Oral QPM  . sodium chloride flush  3 mL Intravenous Q12H   Continuous Infusions: . sodium chloride Stopped (11/10/18 1204)   PRN Meds: sodium chloride, acetaminophen, ondansetron (ZOFRAN) IV, sodium chloride flush   Vital Signs    Vitals:   11/10/18 1614 11/10/18 2021 11/10/18 2321 11/11/18 0420  BP: 103/61 116/64 (!) 107/57 113/63  Pulse: 69 63 67 73  Resp: (!) 28 18 19  (!) 21  Temp: (!) 97.5 F (36.4 C) 97.7 F (36.5 C) 97.7 F (36.5 C) 97.7 F (36.5 C)  TempSrc: Oral Oral Oral Oral  SpO2: 99% 99% 96% 97%  Weight:    86.3 kg  Height:        Intake/Output Summary (Last 24 hours) at 11/11/2018 0756 Last data filed at 11/10/2018 2134 Gross per 24 hour  Intake 659 ml  Output 2075 ml  Net -1416 ml   Filed Weights   11/09/18 0357 11/10/18 0342 11/11/18 0420  Weight: 87.5 kg 87 kg 86.3 kg    Telemetry    NSR - Personally Reviewed  ECG    NA - Personally Reviewed  Physical Exam   GEN: No  acute distress.   Neck: No  JVD Cardiac: RRR, no murmurs, rubs, or gallops.  Respiratory: Clear  to auscultation bilaterally. GI: Soft, nontender, non-distended, normal bowel sounds  MS:  Trace edema; No deformity. Neuro:   Nonfocal  Psych: Oriented and appropriate   Labs    Chemistry Recent Labs  Lab 11/09/18 0246 11/10/18 0322 11/11/18 0334  NA 126* 128* 126*  K 3.5 4.4 4.0  CL 93* 94* 91*  CO2 24  24 26   GLUCOSE 120* 105* 111*  BUN 6* 7* 9  CREATININE 0.79 0.90 0.72  CALCIUM 8.6* 8.9 9.0  PROT  --   --  6.1*  ALBUMIN  --   --  3.2*  AST  --   --  21  ALT  --   --  15  ALKPHOS  --   --  36*  BILITOT  --   --  0.6  GFRNONAA >60 >60 >60  GFRAA >60 >60 >60  ANIONGAP 9 10 9      Hematology Recent Labs  Lab 11/06/18 1206 11/06/18 1927  WBC 8.2 9.3  RBC 3.88* 3.76*  HGB 13.2 12.6*  HCT 35.2* 33.9*  MCV 90.7 90.2  MCH 34.0 33.5  MCHC 37.5* 37.2*  RDW 11.5 11.4*  PLT 312 320    Cardiac EnzymesNo results for input(s): TROPONINI in the last 168 hours.  Recent Labs  Lab 11/06/18 1213  TROPIPOC 0.02     BNP Recent Labs  Lab 11/06/18 1206  BNP 989.9*     DDimer No results for input(s): DDIMER in the last 168 hours.   Radiology    Ct Chest W Contrast  Result Date: 11/10/2018 CLINICAL  DATA:  73 year old male with a history of vascular disease EXAM: CT ANGIOGRAPHY CHEST WITH CONTRAST TECHNIQUE: Multidetector CT imaging of the chest was performed using the standard protocol during bolus administration of intravenous contrast. Multiplanar CT image reconstructions and MIPs were obtained to evaluate the vascular anatomy. CONTRAST:  78mL OMNIPAQUE IOHEXOL 300 MG/ML  SOLN COMPARISON:  MR 11/09/2018 FINDINGS: Cardiovascular: Heart: Cardiomegaly. No pericardial fluid/thickening. Dense and extensive calcifications of left main, left anterior descending, circumflex, right coronary arteries. Aorta: Minimal atherosclerotic changes of the thoracic aorta. No aneurysm. Branch vessels are patent with atherosclerotic calcification at the origin of the branch vessels. Pulmonary arteries: Pulmonary arteries not well evaluated given the timing of the contrast bolus, however, no filling defects within the main or lobar branches. Mediastinum/Nodes: Unremarkable course of the thoracic esophagus. Unremarkable thoracic inlet. No supraclavicular adenopathy. No axillary adenopathy. Small lymph nodes  of the mediastinum, with the largest in the right lowest paratracheal nodal station measuring 7 mm. Subcarinal lymph node measures 9 mm. Lungs/Pleura: No pneumothorax. No pleural effusion. No confluent airspace disease. There are several bilateral sub solid nodules, as manifested by ground-glass nodules. The largest is in the left upper lobe measuring 16 mm on image 46 of series 4. Upper Abdomen: No acute finding of the upper abdomen. Musculoskeletal: No acute displaced fracture. Degenerative changes of the thoracic spine. Review of the MIP images confirms the above findings. IMPRESSION: Advanced left main and 3 vessel coronary artery disease. Aortic atherosclerosis.  Aortic Atherosclerosis (ICD10-I70.0). There are several bilateral ground-glass/sub solid nodules, with the largest of the left upper lobe measuring 16 mm. These may be inflammatory/infectious, however, noncontrast CT at 3-6 months is recommended to assure stability/resolution. This recommendation follows the consensus statement: Guidelines for Management of Incidental Pulmonary Nodules Detected on CT Images: From the Fleischner Society 2017; Radiology 2017; 284:228-243. Cardiomegaly Electronically Signed   By: Gilmer Mor D.O.   On: 11/10/2018 14:49   Mr Cardiac Morphology W Wo Contrast  Result Date: 11/10/2018 CLINICAL DATA:  Ischemic cardiomyopathy, assess for viability. EXAM: CARDIAC MRI TECHNIQUE: The patient was scanned on a 1.5 Tesla GE magnet. A dedicated cardiac coil was used. Functional imaging was done using Fiesta sequences. 2,3, and 4 chamber views were done to assess for RWMA's. Modified Simpson's rule using a short axis stack was used to calculate an ejection fraction on a dedicated work [employer]. The patient received 8 cc of Gadavist. After 10 minutes inversion recovery sequences were used to assess for infiltration and scar tissue. FINDINGS: Limited images of the lung fields showed no gross abnormalities.  Mildly dilated left ventricle with normal wall thickness. Overall LV EF 28%. There was severe hypokinesis of the anterior, anteroseptal, and inferoseptal walls. Basal inferior akinesis. Akinesis of the apex. Moderate left and right atrial enlargement. Mildly dilated right ventricle with mildly decreased systolic function, EF 40%. There was at least moderate mitral regurgitation though flow sequences to quantify were not done. Trileaflet aortic valve with no regurgitation or stenosis. On delayed enhancement imaging, there was about 50% wall thickness subendocardial late gadolinium enhancement in the basal to mid anterior wall. Measurements: LVEDV 251 mL LVSV 71 mL LVEF 28% RVEDV 167 mL RVSV 67 mL RVEF 40% IMPRESSION: 1. Mildly dilated LV with EF 28%. Wall motion abnormalities as noted above. 2. Mildly dilated RV with mildly decreased systolic function, EF 40%. 3.  At least moderate mitral regurgitation. 4. 50% wall thickness subendocardial LGE in the basal to mid anterior wall. Recovery of these  wall segments with revascularization would be equivocal, but would expect other wall segments with wall motion abnormalities to improve with revascularization (significant viability). Dalton Mclean Electronically Signed   By: Marca Ancona M.D.   On: 11/10/2018 15:20    Cardiac Studies   ECHO Result status: Final result   ------------------------------------------------------------------- LV EF: 25% -   30%  ------------------------------------------------------------------- Indications:      CAD of native vessels 414.01.  ------------------------------------------------------------------- History:   PMH:  ectopic atrial/junctional rhythm.  Congestive heart failure.  Primary pulmonary hypertension.  ------------------------------------------------------------------- Study Conclusions  - Left ventricle: The cavity size was mildly dilated. Wall   thickness was normal. Systolic function was severely  reduced. The   estimated ejection fraction was in the range of 25% to 30%.   Moderate diffuse hypokinesis with distinct regional wall motion   abnormalities. Akinesis and scarring of the basal-midinferior and   inferoseptal myocardium; consistent with infarction in the   distribution of the right coronary artery. Severe hypokinesis of   the apicalanteroseptal, anterior, and apical myocardium;   consistent with ischemia or infarction in the distribution of the   left anterior descending coronary artery. Relatively well   preserved inferolateral and anterolateral contractility. Features   are consistent with a pseudonormal left ventricular filling   pattern, with concomitant abnormal relaxation and increased   filling pressure (grade 2 diastolic dysfunction). Acoustic   contrast opacification revealed no evidence ofthrombus. - Mitral valve: There was mild to moderate regurgitation directed   centrally. - Left atrium: The atrium was moderately dilated. - Right ventricle: The cavity size was mildly dilated. - Right atrium: The atrium was mildly dilated. - Pulmonary arteries: Systolic pressure was mildly increased. PA   peak pressure: 39 mm Hg (S).  -------------------------------------------------------------------    MRI:   1. Mildly dilated LV with EF 28%. Wall motion abnormalities as noted above.  2. Mildly dilated RV with mildly decreased systolic function, EF 40%.  3.  At least moderate mitral regurgitation.  4. 50% wall thickness subendocardial LGE in the basal to mid anterior wall. Recovery of these wall segments with revascularization would be equivocal, but would expect other wall segments with wall motion abnormalities to improve with revascularization (significant viability).   Patient Profile     73 y.o. 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  Continue IV Lasix today.   CAD: MRI viability with positive viability.  Plan right heart cath and possible CABG on Friday .  Carotid and venous Dopplers today.    I have reviewed his cath films.  Reasonable targets for cath.    RV DYSFUNCTION:  For right heart cath today.    HYPONATREMIA:    Na remains low but stable.  .    TOBACCO ABUSE:  Educated. On nicotine patch.    ABNORMAL CHEST CT:  Nodules that will need follow up in 3 to 6 months with repeat CT.     For questions or updates, please contact [facility] Please consult [email or web address] for contact info under Cardiology/STEMI.   Signed, Rollene Rotunda, MD  11/11/2018, 7:56 AM

## 2018-11-11 NOTE — H&P (View-Only) (Signed)
Progress Note  Patient Name: David Mack Date of Encounter: 11/11/2018  Primary Cardiologist:   Chrystie Nose, MD   Subjective   No chest pain.  Breathing better but still not back to baseline.   Inpatient Medications    Scheduled Meds: . aspirin EC  81 mg Oral Daily  . dextromethorphan-guaiFENesin  1 tablet Oral BID  . enoxaparin (LOVENOX) injection  40 mg Subcutaneous Q24H  . furosemide  40 mg Intravenous Daily  . losartan  50 mg Oral Daily  . mometasone-formoterol  2 puff Inhalation BID  . nicotine  21 mg Transdermal Daily  . rosuvastatin  20 mg Oral QPM  . sodium chloride flush  3 mL Intravenous Q12H   Continuous Infusions: . sodium chloride Stopped (11/10/18 1204)   PRN Meds: sodium chloride, acetaminophen, ondansetron (ZOFRAN) IV, sodium chloride flush   Vital Signs    Vitals:   11/10/18 1614 11/10/18 2021 11/10/18 2321 11/11/18 0420  BP: 103/61 116/64 (!) 107/57 113/63  Pulse: 69 63 67 73  Resp: (!) 28 18 19  (!) 21  Temp: (!) 97.5 F (36.4 C) 97.7 F (36.5 C) 97.7 F (36.5 C) 97.7 F (36.5 C)  TempSrc: Oral Oral Oral Oral  SpO2: 99% 99% 96% 97%  Weight:    86.3 kg  Height:        Intake/Output Summary (Last 24 hours) at 11/11/2018 0756 Last data filed at 11/10/2018 2134 Gross per 24 hour  Intake 659 ml  Output 2075 ml  Net -1416 ml   Filed Weights   11/09/18 0357 11/10/18 0342 11/11/18 0420  Weight: 87.5 kg 87 kg 86.3 kg    Telemetry    NSR - Personally Reviewed  ECG    NA - Personally Reviewed  Physical Exam   GEN: No  acute distress.   Neck: No  JVD Cardiac: RRR, no murmurs, rubs, or gallops.  Respiratory: Clear  to auscultation bilaterally. GI: Soft, nontender, non-distended, normal bowel sounds  MS:  Trace edema; No deformity. Neuro:   Nonfocal  Psych: Oriented and appropriate   Labs    Chemistry Recent Labs  Lab 11/09/18 0246 11/10/18 0322 11/11/18 0334  NA 126* 128* 126*  K 3.5 4.4 4.0  CL 93* 94* 91*  CO2 24  24 26   GLUCOSE 120* 105* 111*  BUN 6* 7* 9  CREATININE 0.79 0.90 0.72  CALCIUM 8.6* 8.9 9.0  PROT  --   --  6.1*  ALBUMIN  --   --  3.2*  AST  --   --  21  ALT  --   --  15  ALKPHOS  --   --  36*  BILITOT  --   --  0.6  GFRNONAA >60 >60 >60  GFRAA >60 >60 >60  ANIONGAP 9 10 9      Hematology Recent Labs  Lab 11/06/18 1206 11/06/18 1927  WBC 8.2 9.3  RBC 3.88* 3.76*  HGB 13.2 12.6*  HCT 35.2* 33.9*  MCV 90.7 90.2  MCH 34.0 33.5  MCHC 37.5* 37.2*  RDW 11.5 11.4*  PLT 312 320    Cardiac EnzymesNo results for input(s): TROPONINI in the last 168 hours.  Recent Labs  Lab 11/06/18 1213  TROPIPOC 0.02     BNP Recent Labs  Lab 11/06/18 1206  BNP 989.9*     DDimer No results for input(s): DDIMER in the last 168 hours.   Radiology    Ct Chest W Contrast  Result Date: 11/10/2018 CLINICAL  DATA:  73 year old male with a history of vascular disease EXAM: CT ANGIOGRAPHY CHEST WITH CONTRAST TECHNIQUE: Multidetector CT imaging of the chest was performed using the standard protocol during bolus administration of intravenous contrast. Multiplanar CT image reconstructions and MIPs were obtained to evaluate the vascular anatomy. CONTRAST:  78mL OMNIPAQUE IOHEXOL 300 MG/ML  SOLN COMPARISON:  MR 11/09/2018 FINDINGS: Cardiovascular: Heart: Cardiomegaly. No pericardial fluid/thickening. Dense and extensive calcifications of left main, left anterior descending, circumflex, right coronary arteries. Aorta: Minimal atherosclerotic changes of the thoracic aorta. No aneurysm. Branch vessels are patent with atherosclerotic calcification at the origin of the branch vessels. Pulmonary arteries: Pulmonary arteries not well evaluated given the timing of the contrast bolus, however, no filling defects within the main or lobar branches. Mediastinum/Nodes: Unremarkable course of the thoracic esophagus. Unremarkable thoracic inlet. No supraclavicular adenopathy. No axillary adenopathy. Small lymph nodes  of the mediastinum, with the largest in the right lowest paratracheal nodal station measuring 7 mm. Subcarinal lymph node measures 9 mm. Lungs/Pleura: No pneumothorax. No pleural effusion. No confluent airspace disease. There are several bilateral sub solid nodules, as manifested by ground-glass nodules. The largest is in the left upper lobe measuring 16 mm on image 46 of series 4. Upper Abdomen: No acute finding of the upper abdomen. Musculoskeletal: No acute displaced fracture. Degenerative changes of the thoracic spine. Review of the MIP images confirms the above findings. IMPRESSION: Advanced left main and 3 vessel coronary artery disease. Aortic atherosclerosis.  Aortic Atherosclerosis (ICD10-I70.0). There are several bilateral ground-glass/sub solid nodules, with the largest of the left upper lobe measuring 16 mm. These may be inflammatory/infectious, however, noncontrast CT at 3-6 months is recommended to assure stability/resolution. This recommendation follows the consensus statement: Guidelines for Management of Incidental Pulmonary Nodules Detected on CT Images: From the Fleischner Society 2017; Radiology 2017; 284:228-243. Cardiomegaly Electronically Signed   By: Gilmer Mor D.O.   On: 11/10/2018 14:49   Mr Cardiac Morphology W Wo Contrast  Result Date: 11/10/2018 CLINICAL DATA:  Ischemic cardiomyopathy, assess for viability. EXAM: CARDIAC MRI TECHNIQUE: The patient was scanned on a 1.5 Tesla GE magnet. A dedicated cardiac coil was used. Functional imaging was done using Fiesta sequences. 2,3, and 4 chamber views were done to assess for RWMA's. Modified Simpson's rule using a short axis stack was used to calculate an ejection fraction on a dedicated work [employer]. The patient received 8 cc of Gadavist. After 10 minutes inversion recovery sequences were used to assess for infiltration and scar tissue. FINDINGS: Limited images of the lung fields showed no gross abnormalities.  Mildly dilated left ventricle with normal wall thickness. Overall LV EF 28%. There was severe hypokinesis of the anterior, anteroseptal, and inferoseptal walls. Basal inferior akinesis. Akinesis of the apex. Moderate left and right atrial enlargement. Mildly dilated right ventricle with mildly decreased systolic function, EF 40%. There was at least moderate mitral regurgitation though flow sequences to quantify were not done. Trileaflet aortic valve with no regurgitation or stenosis. On delayed enhancement imaging, there was about 50% wall thickness subendocardial late gadolinium enhancement in the basal to mid anterior wall. Measurements: LVEDV 251 mL LVSV 71 mL LVEF 28% RVEDV 167 mL RVSV 67 mL RVEF 40% IMPRESSION: 1. Mildly dilated LV with EF 28%. Wall motion abnormalities as noted above. 2. Mildly dilated RV with mildly decreased systolic function, EF 40%. 3.  At least moderate mitral regurgitation. 4. 50% wall thickness subendocardial LGE in the basal to mid anterior wall. Recovery of these  wall segments with revascularization would be equivocal, but would expect other wall segments with wall motion abnormalities to improve with revascularization (significant viability). Dalton Mclean Electronically Signed   By: Marca Anconaalton  Mclean M.D.   On: 11/10/2018 15:20    Cardiac Studies   ECHO Result status: Final result   ------------------------------------------------------------------- LV EF: 25% -   30%  ------------------------------------------------------------------- Indications:      CAD of native vessels 414.01.  ------------------------------------------------------------------- History:   PMH:  ectopic atrial/junctional rhythm.  Congestive heart failure.  Primary pulmonary hypertension.  ------------------------------------------------------------------- Study Conclusions  - Left ventricle: The cavity size was mildly dilated. Wall   thickness was normal. Systolic function was severely  reduced. The   estimated ejection fraction was in the range of 25% to 30%.   Moderate diffuse hypokinesis with distinct regional wall motion   abnormalities. Akinesis and scarring of the basal-midinferior and   inferoseptal myocardium; consistent with infarction in the   distribution of the right coronary artery. Severe hypokinesis of   the apicalanteroseptal, anterior, and apical myocardium;   consistent with ischemia or infarction in the distribution of the   left anterior descending coronary artery. Relatively well   preserved inferolateral and anterolateral contractility. Features   are consistent with a pseudonormal left ventricular filling   pattern, with concomitant abnormal relaxation and increased   filling pressure (grade 2 diastolic dysfunction). Acoustic   contrast opacification revealed no evidence ofthrombus. - Mitral valve: There was mild to moderate regurgitation directed   centrally. - Left atrium: The atrium was moderately dilated. - Right ventricle: The cavity size was mildly dilated. - Right atrium: The atrium was mildly dilated. - Pulmonary arteries: Systolic pressure was mildly increased. PA   peak pressure: 39 mm Hg (S).  -------------------------------------------------------------------    MRI:   1. Mildly dilated LV with EF 28%. Wall motion abnormalities as noted above.  2. Mildly dilated RV with mildly decreased systolic function, EF 40%.  3.  At least moderate mitral regurgitation.  4. 50% wall thickness subendocardial LGE in the basal to mid anterior wall. Recovery of these wall segments with revascularization would be equivocal, but would expect other wall segments with wall motion abnormalities to improve with revascularization (significant viability).   Patient Profile     73 y.o. 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  Continue IV Lasix today.   CAD: MRI viability with positive viability.  Plan right heart cath and possible PCI on Friday .  Carotid and venous Dopplers today.    I have reviewed his cath films.  Reasonable targets for cath.    RV DYSFUNCTION:  For right heart cath today.    HYPONATREMIA:    Na remains low but stable.  .    TOBACCO ABUSE:  Educated. On nicotine patch.    ABNORMAL CHEST CT:  Nodules that will need follow up in 3 to 6 months with repeat CT.     For questions or updates, please contact [facility] Please consult [email or web address] for contact info under Cardiology/STEMI.   Signed, Rollene RotundaJames Khair Chasteen, MD  11/11/2018, 7:56 AM

## 2018-11-12 ENCOUNTER — Ambulatory Visit: Payer: PRIVATE HEALTH INSURANCE | Admitting: Cardiovascular Disease

## 2018-11-12 ENCOUNTER — Inpatient Hospital Stay (HOSPITAL_COMMUNITY): Payer: Medicare Other

## 2018-11-12 ENCOUNTER — Encounter (HOSPITAL_COMMUNITY): Payer: Self-pay | Admitting: Cardiology

## 2018-11-12 ENCOUNTER — Encounter (HOSPITAL_COMMUNITY)
Admission: EM | Disposition: A | Discharge status: Home Health | Payer: Self-pay | Source: Home / Self Care | Attending: Internal Medicine

## 2018-11-12 DIAGNOSIS — Z01810 Encounter for preprocedural cardiovascular examination: Secondary | ICD-10-CM

## 2018-11-12 DIAGNOSIS — I509 Heart failure, unspecified: Secondary | ICD-10-CM

## 2018-11-12 DIAGNOSIS — I2510 Atherosclerotic heart disease of native coronary artery without angina pectoris: Secondary | ICD-10-CM

## 2018-11-12 DIAGNOSIS — I340 Nonrheumatic mitral (valve) insufficiency: Secondary | ICD-10-CM

## 2018-11-12 HISTORY — PX: RIGHT HEART CATH: CATH118263

## 2018-11-12 LAB — POCT I-STAT 3, VENOUS BLOOD GAS (G3P V)
Acid-Base Excess: 2 mmol/L (ref 0.0–2.0)
Acid-Base Excess: 2 mmol/L (ref 0.0–2.0)
Bicarbonate: 25.2 mmol/L (ref 20.0–28.0)
Bicarbonate: 25.4 mmol/L (ref 20.0–28.0)
O2 Saturation: 75 %
O2 Saturation: 76 %
PH VEN: 7.464 — AB (ref 7.250–7.430)
PH VEN: 7.472 — AB (ref 7.250–7.430)
TCO2: 26 mmol/L (ref 22–32)
TCO2: 26 mmol/L (ref 22–32)
pCO2, Ven: 34.5 mmHg — ABNORMAL LOW (ref 44.0–60.0)
pCO2, Ven: 35.4 mmHg — ABNORMAL LOW (ref 44.0–60.0)
pO2, Ven: 37 mmHg (ref 32.0–45.0)
pO2, Ven: 38 mmHg (ref 32.0–45.0)

## 2018-11-12 LAB — BASIC METABOLIC PANEL
Anion gap: 9 (ref 5–15)
BUN: 7 mg/dL — AB (ref 8–23)
CO2: 25 mmol/L (ref 22–32)
Calcium: 9 mg/dL (ref 8.9–10.3)
Chloride: 93 mmol/L — ABNORMAL LOW (ref 98–111)
Creatinine, Ser: 0.77 mg/dL (ref 0.61–1.24)
GFR calc Af Amer: >60 mL/min (ref >60)
GFR calc non Af Amer: >60 mL/min (ref >60)
Glucose, Bld: 105 mg/dL — ABNORMAL HIGH (ref 70–99)
Potassium: 3.8 mmol/L (ref 3.5–5.1)
Sodium: 127 mmol/L — ABNORMAL LOW (ref 135–145)

## 2018-11-12 LAB — CREATININE, SERUM
Creatinine, Ser: 0.95 mg/dL (ref 0.61–1.24)
GFR calc Af Amer: >60 mL/min (ref >60)

## 2018-11-12 LAB — CBC
HEMATOCRIT: 37.9 % — AB (ref 39.0–52.0)
Hemoglobin: 13 g/dL (ref 13.0–17.0)
MCH: 32.3 pg (ref 26.0–34.0)
MCHC: 34.3 g/dL (ref 30.0–36.0)
MCV: 94 fL (ref 80.0–100.0)
Platelets: 272 10*3/uL (ref 150–400)
RBC: 4.03 MIL/uL — ABNORMAL LOW (ref 4.22–5.81)
RDW: 11.9 % (ref 11.5–15.5)
WBC: 7.1 10*3/uL (ref 4.0–10.5)
nRBC: 0 % (ref 0.0–0.2)

## 2018-11-12 LAB — PREPARE RBC (CROSSMATCH)

## 2018-11-12 LAB — ABO/RH: ABO/RH(D): A POS

## 2018-11-12 SURGERY — RIGHT HEART CATH
Anesthesia: LOCAL

## 2018-11-12 MED ORDER — CHLORHEXIDINE GLUCONATE 4 % EX LIQD
60.0000 mL | Freq: Once | CUTANEOUS | Status: AC
  Administered 2018-11-12: 4 via TOPICAL
  Filled 2018-11-12: qty 60

## 2018-11-12 MED ORDER — INSULIN REGULAR(HUMAN) IN NACL 100-0.9 UT/100ML-% IV SOLN
INTRAVENOUS | Status: DC
  Filled 2018-11-12: qty 100

## 2018-11-12 MED ORDER — SODIUM CHLORIDE 0.9 % IV SOLN: 250.0000 mL | INTRAVENOUS | Status: DC | PRN

## 2018-11-12 MED ORDER — METOPROLOL TARTRATE 12.5 MG HALF TABLET
12.5000 mg | ORAL_TABLET | Freq: Once | ORAL | Status: AC
  Administered 2018-11-13: 12.5 mg via ORAL
  Filled 2018-11-12: qty 1

## 2018-11-12 MED ORDER — PHENYLEPHRINE HCL-NACL 20-0.9 MG/250ML-% IV SOLN
30.0000 ug/min | INTRAVENOUS | Status: AC
  Administered 2018-11-13: 50 ug/min via INTRAVENOUS
  Filled 2018-11-12: qty 250

## 2018-11-12 MED ORDER — DOPAMINE-DEXTROSE 3.2-5 MG/ML-% IV SOLN
0.0000 ug/kg/min | INTRAVENOUS | Status: DC
  Filled 2018-11-12: qty 250

## 2018-11-12 MED ORDER — DEXMEDETOMIDINE HCL IN NACL 400 MCG/100ML IV SOLN
0.1000 ug/kg/h | INTRAVENOUS | Status: DC
  Filled 2018-11-12: qty 100

## 2018-11-12 MED ORDER — POTASSIUM CHLORIDE 2 MEQ/ML IV SOLN
80.0000 meq | INTRAVENOUS | Status: DC
  Filled 2018-11-12: qty 40

## 2018-11-12 MED ORDER — ALPRAZOLAM 0.25 MG PO TABS: 0.2500 mg | ORAL_TABLET | ORAL | Status: DC | PRN

## 2018-11-12 MED ORDER — NITROGLYCERIN IN D5W 200-5 MCG/ML-% IV SOLN
2.0000 ug/min | INTRAVENOUS | Status: DC
  Filled 2018-11-12: qty 250

## 2018-11-12 MED ORDER — VANCOMYCIN HCL 10 G IV SOLR
1500.0000 mg | INTRAVENOUS | Status: DC
  Filled 2018-11-12: qty 1500

## 2018-11-12 MED ORDER — HEPARIN (PORCINE) IN NACL 1000-0.9 UT/500ML-% IV SOLN
INTRAVENOUS | Status: AC
  Filled 2018-11-12: qty 500

## 2018-11-12 MED ORDER — SODIUM CHLORIDE 0.9 % IV SOLN
1.5000 g | INTRAVENOUS | Status: AC
  Administered 2018-11-13: 1.5 g via INTRAVENOUS
  Filled 2018-11-12: qty 1.5

## 2018-11-12 MED ORDER — BISACODYL 5 MG PO TBEC
5.0000 mg | DELAYED_RELEASE_TABLET | Freq: Once | ORAL | Status: AC
  Administered 2018-11-12: 5 mg via ORAL
  Filled 2018-11-12: qty 1

## 2018-11-12 MED ORDER — LIDOCAINE HCL (PF) 1 % IJ SOLN
INTRAMUSCULAR | Status: DC | PRN
  Administered 2018-11-12: 2 mL

## 2018-11-12 MED ORDER — VANCOMYCIN HCL 1000 MG IV SOLR: INTRAVENOUS | Status: DC

## 2018-11-12 MED ORDER — TEMAZEPAM 7.5 MG PO CAPS: 15.0000 mg | ORAL_CAPSULE | Freq: Once | ORAL | Status: DC | PRN

## 2018-11-12 MED ORDER — HEPARIN SODIUM (PORCINE) 5000 UNIT/ML IJ SOLN: 5000.0000 [IU] | Freq: Three times a day (TID) | INTRAMUSCULAR | Status: DC

## 2018-11-12 MED ORDER — SODIUM CHLORIDE 0.9 % IV SOLN
INTRAVENOUS | Status: DC
  Filled 2018-11-12: qty 30

## 2018-11-12 MED ORDER — SODIUM CHLORIDE 0.9% FLUSH: 3.0000 mL | INTRAVENOUS | Status: DC | PRN

## 2018-11-12 MED ORDER — TRANEXAMIC ACID (OHS) PUMP PRIME SOLUTION
2.0000 mg/kg | INTRAVENOUS | Status: DC
  Filled 2018-11-12: qty 1.71

## 2018-11-12 MED ORDER — TRANEXAMIC ACID 1000 MG/10ML IV SOLN
1.5000 mg/kg/h | INTRAVENOUS | Status: AC
  Administered 2018-11-13: 1.5 mg/kg/h via INTRAVENOUS
  Filled 2018-11-12: qty 25

## 2018-11-12 MED ORDER — MILRINONE LACTATE IN DEXTROSE 20-5 MG/100ML-% IV SOLN
0.3000 ug/kg/min | INTRAVENOUS | Status: DC
  Filled 2018-11-12: qty 100

## 2018-11-12 MED ORDER — MAGNESIUM SULFATE 50 % IJ SOLN
40.0000 meq | INTRAMUSCULAR | Status: DC
  Filled 2018-11-12: qty 9.85

## 2018-11-12 MED ORDER — DEXMEDETOMIDINE HCL IN NACL 400 MCG/100ML IV SOLN
0.1000 ug/kg/h | INTRAVENOUS | Status: AC
  Administered 2018-11-13: .5 ug/kg/h via INTRAVENOUS
  Filled 2018-11-12: qty 100

## 2018-11-12 MED ORDER — EPINEPHRINE PF 1 MG/ML IJ SOLN
0.0000 ug/min | INTRAVENOUS | Status: AC
  Administered 2018-11-13: 2 ug/min via INTRAVENOUS
  Filled 2018-11-12: qty 4

## 2018-11-12 MED ORDER — SODIUM CHLORIDE 0.9 % IV SOLN
1.5000 g | INTRAVENOUS | Status: DC
  Filled 2018-11-12: qty 1.5

## 2018-11-12 MED ORDER — ACETAMINOPHEN 325 MG PO TABS: 650.0000 mg | ORAL_TABLET | ORAL | Status: DC | PRN

## 2018-11-12 MED ORDER — EPINEPHRINE PF 1 MG/ML IJ SOLN
0.0000 ug/min | INTRAVENOUS | Status: DC
  Filled 2018-11-12: qty 4

## 2018-11-12 MED ORDER — CHLORHEXIDINE GLUCONATE 4 % EX LIQD
60.0000 mL | Freq: Once | CUTANEOUS | Status: AC
  Administered 2018-11-13: 4 via TOPICAL
  Filled 2018-11-12: qty 60

## 2018-11-12 MED ORDER — VANCOMYCIN HCL 10 G IV SOLR
1500.0000 mg | INTRAVENOUS | Status: AC
  Administered 2018-11-13: 1500 mg via INTRAVENOUS
  Filled 2018-11-12: qty 1500

## 2018-11-12 MED ORDER — PLASMA-LYTE 148 IV SOLN
INTRAVENOUS | Status: DC
  Filled 2018-11-12 (×2): qty 2.5

## 2018-11-12 MED ORDER — NOREPINEPHRINE BITARTRATE 1 MG/ML IV SOLN
0.0000 ug/min | INTRAVENOUS | Status: DC
  Filled 2018-11-12: qty 4

## 2018-11-12 MED ORDER — SODIUM CHLORIDE 0.9% FLUSH
3.0000 mL | Freq: Two times a day (BID) | INTRAVENOUS | Status: DC
  Administered 2018-11-12: 3 mL via INTRAVENOUS

## 2018-11-12 MED ORDER — NITROGLYCERIN IN D5W 200-5 MCG/ML-% IV SOLN
2.0000 ug/min | INTRAVENOUS | Status: AC
  Administered 2018-11-13: 30 ug/min via INTRAVENOUS
  Filled 2018-11-12: qty 250

## 2018-11-12 MED ORDER — LIDOCAINE HCL (PF) 1 % IJ SOLN
INTRAMUSCULAR | Status: AC
  Filled 2018-11-12: qty 30

## 2018-11-12 MED ORDER — TRANEXAMIC ACID 1000 MG/10ML IV SOLN
1.5000 mg/kg/h | INTRAVENOUS | Status: DC
  Filled 2018-11-12: qty 25

## 2018-11-12 MED ORDER — INSULIN REGULAR(HUMAN) IN NACL 100-0.9 UT/100ML-% IV SOLN
INTRAVENOUS | Status: AC
  Administered 2018-11-13: 1 [IU]/h via INTRAVENOUS
  Filled 2018-11-12: qty 100

## 2018-11-12 MED ORDER — HEPARIN (PORCINE) IN NACL 1000-0.9 UT/500ML-% IV SOLN
INTRAVENOUS | Status: DC | PRN
  Administered 2018-11-12: 500 mL

## 2018-11-12 MED ORDER — SODIUM CHLORIDE 0.9 % IV SOLN
750.0000 mg | INTRAVENOUS | Status: AC
  Administered 2018-11-13: 750 mg via INTRAVENOUS
  Filled 2018-11-12: qty 750

## 2018-11-12 MED ORDER — MILRINONE LACTATE IN DEXTROSE 20-5 MG/100ML-% IV SOLN
0.3000 ug/kg/min | INTRAVENOUS | Status: AC
  Administered 2018-11-13: .25 ug/kg/min via INTRAVENOUS
  Filled 2018-11-12: qty 100

## 2018-11-12 MED ORDER — PLASMA-LYTE 148 IV SOLN
INTRAVENOUS | Status: DC
  Filled 2018-11-12: qty 2.5

## 2018-11-12 MED ORDER — CHLORHEXIDINE GLUCONATE 0.12 % MT SOLN
15.0000 mL | Freq: Once | OROMUCOSAL | Status: AC
  Administered 2018-11-13: 15 mL via OROMUCOSAL
  Filled 2018-11-12: qty 15

## 2018-11-12 MED ORDER — VANCOMYCIN HCL 1000 MG IV SOLR
INTRAVENOUS | Status: DC
  Filled 2018-11-12: qty 1000

## 2018-11-12 MED ORDER — TRANEXAMIC ACID (OHS) BOLUS VIA INFUSION
15.0000 mg/kg | INTRAVENOUS | Status: AC
  Administered 2018-11-13: 1285.5 mg via INTRAVENOUS
  Filled 2018-11-12: qty 1286

## 2018-11-12 MED ORDER — DIAZEPAM 5 MG PO TABS
5.0000 mg | ORAL_TABLET | Freq: Once | ORAL | Status: AC
  Administered 2018-11-13: 5 mg via ORAL
  Filled 2018-11-12: qty 1

## 2018-11-12 MED ORDER — NOREPINEPHRINE BITARTRATE 1 MG/ML IV SOLN
0.0000 ug/min | INTRAVENOUS | Status: DC
  Filled 2018-11-12 (×2): qty 4

## 2018-11-12 MED ORDER — ONDANSETRON HCL 4 MG/2ML IJ SOLN: 4.0000 mg | Freq: Four times a day (QID) | INTRAMUSCULAR | Status: DC | PRN

## 2018-11-12 MED ORDER — SODIUM CHLORIDE 0.9 % IV SOLN
750.0000 mg | INTRAVENOUS | Status: DC
  Filled 2018-11-12: qty 750

## 2018-11-12 MED ORDER — PHENYLEPHRINE HCL-NACL 20-0.9 MG/250ML-% IV SOLN
30.0000 ug/min | INTRAVENOUS | Status: DC
  Filled 2018-11-12: qty 250

## 2018-11-12 MED ORDER — TRANEXAMIC ACID (OHS) BOLUS VIA INFUSION
15.0000 mg/kg | INTRAVENOUS | Status: DC
  Filled 2018-11-12: qty 1286

## 2018-11-12 MED ORDER — MOMETASONE FURO-FORMOTEROL FUM 200-5 MCG/ACT IN AERO: 2.0000 | INHALATION_SPRAY | Freq: Two times a day (BID) | RESPIRATORY_TRACT | Status: DC

## 2018-11-12 SURGICAL SUPPLY — 6 items
CATH BALLN WEDGE 5F 110CM (CATHETERS) ×1 IMPLANT
KIT HEART LEFT (KITS) ×2 IMPLANT
PACK CARDIAC CATHETERIZATION (CUSTOM PROCEDURE TRAY) ×2 IMPLANT
SHEATH GLIDE SLENDER 4/5FR (SHEATH) ×1 IMPLANT
TRANSDUCER W/STOPCOCK (MISCELLANEOUS) ×2 IMPLANT
TUBING CIL FLEX 10 FLL-RA (TUBING) ×2 IMPLANT

## 2018-11-12 NOTE — Interval H&P Note (Signed)
History and Physical Interval Note:  11/12/2018 1:17 PM  David Mack  has presented today for surgery, with the diagnosis of hf  The various methods of treatment have been discussed with the patient and family. After consideration of risks, benefits and other options for treatment, the patient has consented to  Procedure(s): RIGHT HEART CATH (N/A) as a surgical intervention .  The patient's history has been reviewed, patient examined, no change in status, stable for surgery.  I have reviewed the patient's chart and labs.  Questions were answered to the patient's satisfaction.     Tangie Stay [employer]

## 2018-11-12 NOTE — Anesthesia Preprocedure Evaluation (Addendum)
Anesthesia Evaluation  Patient identified by MRN, date of birth, ID band Patient awake    Reviewed: Allergy & Precautions, NPO status , Patient's Chart, lab work & pertinent test results, reviewed documented beta blocker date and time   Airway Mallampati: II  TM Distance: >3 FB Neck ROM: Full    Dental  (+) Dental Advisory Given, Missing   Pulmonary COPD, Current Smoker,    Pulmonary exam normal breath sounds clear to auscultation       Cardiovascular hypertension, Pt. on medications and Pt. on home beta blockers + CAD and +CHF  + Valvular Problems/Murmurs MR  Rhythm:Regular Rate:Normal + Systolic murmurs Echo 11/10/18: Study Conclusions  - Left ventricle: The cavity size was mildly dilated. Wall   thickness was normal. Systolic function was severely reduced. The estimated ejection fraction was in the range of 25% to 30%. Moderate diffuse hypokinesis with distinct regional wall motion abnormalities. Akinesis and scarring of the basal-midinferior and inferoseptal myocardium; consistent with infarction in the distribution of the right coronary artery. Severe hypokinesis of the apicalanteroseptal, anterior, and apical myocardium;   consistent with ischemia or infarction in the distribution of the left anterior descending coronary artery. Relatively well preserved inferolateral and anterolateral contractility. Features are consistent with a pseudonormal left ventricular filling pattern, with concomitant abnormal relaxation and increased filling pressure (grade 2 diastolic dysfunction). Acoustic contrast opacification revealed no evidence ofthrombus. - Mitral valve: There was mild to moderate regurgitation directed centrally. - Left atrium: The atrium was moderately dilated. - Right ventricle: The cavity size was mildly dilated. - Right atrium: The atrium was mildly dilated. - Pulmonary arteries: Systolic pressure was mildly increased. PA  peak pressure: 39 mm Hg (S).   Neuro/Psych negative neurological ROS     GI/Hepatic negative GI ROS, Neg liver ROS,   Endo/Other  Obesity   Renal/GU negative Renal ROS     Musculoskeletal  (+) Arthritis ,   Abdominal   Peds  Hematology negative hematology ROS (+)   Anesthesia Other Findings Day of surgery medications reviewed with the patient.  Reproductive/Obstetrics                            Anesthesia Physical Anesthesia Plan  ASA: IV  Anesthesia Plan: General   Post-op Pain Management:    Induction: Intravenous  PONV Risk Score and Plan: 1 and Midazolam and Treatment may vary due to age or medical condition  Airway Management Planned: Oral ETT  Additional Equipment: Arterial line, CVP, PA Cath, TEE and Ultrasound Guidance Line Placement  Intra-op Plan:   Post-operative Plan:   Informed Consent: I have reviewed the patients History and Physical, chart, labs and discussed the procedure including the risks, benefits and alternatives for the proposed anesthesia with the patient or authorized representative who has indicated his/her understanding and acceptance.   Dental advisory given  Plan Discussed with: CRNA  Anesthesia Plan Comments: (Etomidate on induction.)       Anesthesia Quick Evaluation

## 2018-11-12 NOTE — Progress Notes (Signed)
Day of Surgery Procedure(s) (LRB): RIGHT HEART CATH (N/A) Subjective: [name] had right heart cath today which showed adequate cardiac output, elevated pulmonary artery wedge pressure associated with his ischemic cardiomyopathy with the low ejection fraction and moderate ischemic mitral regurgitation.  His heart failure has significantly improved with medical therapy and he appears to be optimally prepared for high risk CABG with possible combined mitral valve replacement-repair. I discussed the procedure in detail with the patient and his wife including expected benefits and risks of stroke, bleeding, blood transfusion, organ failure, infection, and death.  Objective: Vital signs in last 24 hours: Temp:  [97.5 F (36.4 C)-98.3 F (36.8 C)] 98.3 F (36.8 C) (01/02 1615) Pulse Rate:  [64-83] 72 (01/02 1615) Cardiac Rhythm: Normal sinus rhythm (01/02 0700) Resp:  [0-65] 28 (01/02 1615) BP: (104-136)/(57-84) 108/64 (01/02 1615) SpO2:  [97 %-100 %] 99 % (01/02 1615) Weight:  [85.7 kg] 85.7 kg (01/02 0355)  Hemodynamic parameters for last 24 hours:    Intake/Output from previous day: 01/01 0701 - 01/02 0700 In: 360 [P.O.:360] Out: 1175 [Urine:1175] Intake/Output this shift: Total I/O In: -  Out: 900 [Urine:900]  Sinus rhythm, no murmur Lungs with slightly diminished breath sounds Mild peripheral edema   Lab Results: No results for input(s): WBC, HGB, HCT, PLT in the last 72 hours. BMET:  Recent Labs    11/11/18 0334 11/12/18 0241  NA 126* 127*  K 4.0 3.8  CL 91* 93*  CO2 26 25  GLUCOSE 111* 105*  BUN 9 7*  CREATININE 0.72 0.77  CALCIUM 9.0 9.0    PT/INR:  Recent Labs    11/11/18 0334  LABPROT 13.7  INR 1.06   ABG    Component Value Date/Time   PHART 7.496 (H) 11/11/2018 0315   HCO3 25.2 11/12/2018 1332   HCO3 25.4 11/12/2018 1332   TCO2 26 11/12/2018 1332   TCO2 26 11/12/2018 1332   O2SAT 76.0 11/12/2018 1332   O2SAT 75.0 11/12/2018 1332   CBG  (last 3)  No results for input(s): GLUCAP in the last 72 hours.  Assessment/Plan: S/P Procedure(s) (LRB): RIGHT HEART CATH (N/A) Plan high risk CABG with possible mitral valve replacement in a.m.   LOS: 6 days    Kathlee Nations Trigt III 11/12/2018

## 2018-11-12 NOTE — Progress Notes (Signed)
PRE CABG and lower extremity mapping has been completed.   Preliminary results in CV Proc.   Blanch MediaMegan Elyanna Wallick 11/12/2018 11:33 AM

## 2018-11-12 NOTE — Progress Notes (Signed)
Progress Note  Patient Name: David Mack Date of Encounter: 11/12/2018  Primary Cardiologist:   Chrystie Nose, MD   Subjective   No pain.  Ambulated and breathing OK but not baseline.   Inpatient Medications    Scheduled Meds: . aspirin EC  81 mg Oral Daily  . dextromethorphan-guaiFENesin  1 tablet Oral BID  . enoxaparin (LOVENOX) injection  40 mg Subcutaneous Q24H  . furosemide  40 mg Intravenous Daily  . losartan  50 mg Oral Daily  . mometasone-formoterol  2 puff Inhalation BID  . nicotine  21 mg Transdermal Daily  . rosuvastatin  20 mg Oral QPM  . sodium chloride flush  3 mL Intravenous Q12H   Continuous Infusions: . sodium chloride Stopped (11/10/18 1204)   PRN Meds: sodium chloride, acetaminophen, docusate sodium, ondansetron (ZOFRAN) IV, polyethylene glycol, sodium chloride flush   Vital Signs    Vitals:   11/12/18 1320 11/12/18 1325 11/12/18 1330 11/12/18 1331  BP: 132/76 129/76 128/78 130/75  Pulse: 75 77 81 79  Resp: 18 (!) 42 (!) 36 (!) 0  Temp:      TempSrc:      SpO2: 98% 99% 99% 98%  Weight:      Height:        Intake/Output Summary (Last 24 hours) at 11/12/2018 1456 Last data filed at 11/12/2018 1100 Gross per 24 hour  Intake 120 ml  Output 975 ml  Net -855 ml   Filed Weights   11/10/18 0342 11/11/18 0420 11/12/18 0355  Weight: 87 kg 86.3 kg 85.7 kg    Telemetry    NSR - Personally Reviewed  ECG    NA - Personally Reviewed  Physical Exam   GEN: No  acute distress.   Neck: No  JVD Cardiac: RRR, no murmurs, rubs, or gallops.  Respiratory: Clear   to auscultation bilaterally. GI: Soft, nontender, non-distended, normal bowel sounds  MS:  Trace edema; No deformity. Neuro:   Nonfocal  Psych: Oriented and appropriate   Labs    Chemistry Recent Labs  Lab 11/10/18 0322 11/11/18 0334 11/12/18 0241  NA 128* 126* 127*  K 4.4 4.0 3.8  CL 94* 91* 93*  CO2 24 26 25   GLUCOSE 105* 111* 105*  BUN 7* 9 7*  CREATININE 0.90 0.72  0.77  CALCIUM 8.9 9.0 9.0  PROT  --  6.1*  --   ALBUMIN  --  3.2*  --   AST  --  21  --   ALT  --  15  --   ALKPHOS  --  36*  --   BILITOT  --  0.6  --   GFRNONAA >60 >60 >60  GFRAA >60 >60 >60  ANIONGAP 10 9 9      Hematology Recent Labs  Lab 11/06/18 1206 11/06/18 1927  WBC 8.2 9.3  RBC 3.88* 3.76*  HGB 13.2 12.6*  HCT 35.2* 33.9*  MCV 90.7 90.2  MCH 34.0 33.5  MCHC 37.5* 37.2*  RDW 11.5 11.4*  PLT 312 320    Cardiac EnzymesNo results for input(s): TROPONINI in the last 168 hours.  Recent Labs  Lab 11/06/18 1213  TROPIPOC 0.02     BNP Recent Labs  Lab 11/06/18 1206  BNP 989.9*     DDimer No results for input(s): DDIMER in the last 168 hours.   Radiology    Vas [place] Lower Extremity Saphenous Vein Mapping  Result Date: 11/12/2018 LOWER EXTREMITY VEIN MAPPING Indications: Pre-op  Performing Technologist:  Blanch Media RVS  Examination Guidelines: A complete evaluation includes B-mode imaging, spectral Doppler, color Doppler, and power Doppler as needed of all accessible portions of each vessel. Bilateral testing is considered an integral part of a complete examination. Limited examinations for reoccurring indications may be performed as noted. +---------------+-----------+----------------------+---------------+-----------+   RT Diameter  RT Findings         GSV            LT Diameter  LT Findings      (cm)                                            (cm)                  +---------------+-----------+----------------------+---------------+-----------+      0.69                     Saphenofemoral         0.58                                                   Junction                                  +---------------+-----------+----------------------+---------------+-----------+      0.40                     Proximal thigh         0.32                  +---------------+-----------+----------------------+---------------+-----------+      0.34        branching       Mid thigh            0.43       branching  +---------------+-----------+----------------------+---------------+-----------+      0.26                      Distal thigh          0.23                  +---------------+-----------+----------------------+---------------+-----------+      0.23                          Knee              0.20                  +---------------+-----------+----------------------+---------------+-----------+      0.24                       Prox calf            0.20                  +---------------+-----------+----------------------+---------------+-----------+      0.19                        Mid calf            0.23                  +---------------+-----------+----------------------+---------------+-----------+  0.16                      Distal calf           0.22                  +---------------+-----------+----------------------+---------------+-----------+      0.22                         Ankle              0.22                  +---------------+-----------+----------------------+---------------+-----------+    Preliminary    Vas [place] Doppler Pre Cabg  Result Date: 11/12/2018 PREOPERATIVE VASCULAR EVALUATION  Indications: Pre cabg. Performing Technologist: Blanch MediaMegan Riddle RVS  Examination Guidelines: A complete evaluation includes B-mode imaging, spectral Doppler, color Doppler, and power Doppler as needed of all accessible portions of each vessel. Bilateral testing is considered an integral part of a complete examination. Limited examinations for reoccurring indications may be performed as noted.  Right Carotid Findings: +----------+--------+--------+--------+------------+--------+           PSV cm/sEDV cm/sStenosisDescribe    Comments +----------+--------+--------+--------+------------+--------+ CCA Prox  69      14              heterogenous         +----------+--------+--------+--------+------------+--------+  CCA Distal66      16              heterogenous         +----------+--------+--------+--------+------------+--------+ ICA Prox  63      11      1-39%   heterogenous         +----------+--------+--------+--------+------------+--------+ ICA Distal63      16                                   +----------+--------+--------+--------+------------+--------+ ECA       109     12                                   +----------+--------+--------+--------+------------+--------+  +----------+--------+-------+--------+------------+           PSV cm/sEDV cmsDescribeArm Pressure +----------+--------+-------+--------+------------+ Subclavian97                     127          +----------+--------+-------+--------+------------+ +---------+--------+--+--------+-+---------+ VertebralPSV cm/s23EDV cm/s5Antegrade +---------+--------+--+--------+-+---------+ Left Carotid Findings: +----------+--------+--------+--------+------------+--------+           PSV cm/sEDV cm/sStenosisDescribe    Comments +----------+--------+--------+--------+------------+--------+ CCA Prox  128     14              heterogenous         +----------+--------+--------+--------+------------+--------+ CCA Distal125     20              heterogenous         +----------+--------+--------+--------+------------+--------+ ICA Prox  106     11      1-39%   heterogenous         +----------+--------+--------+--------+------------+--------+ ICA Distal59      15                                   +----------+--------+--------+--------+------------+--------+  ECA       99      12                                   +----------+--------+--------+--------+------------+--------+ +----------+--------+--------+--------+------------+ SubclavianPSV cm/sEDV cm/sDescribeArm Pressure +----------+--------+--------+--------+------------+           109                     127           +----------+--------+--------+--------+------------+ +---------+--------+--+--------+-+---------+ VertebralPSV cm/s31EDV cm/s7Antegrade +---------+--------+--+--------+-+---------+  ABI Findings: +--------+------------------+-----+---------+--------+ Right   Rt Pressure (mmHg)IndexWaveform Comment  +--------+------------------+-----+---------+--------+ [id number]                    triphasic         +--------+------------------+-----+---------+--------+ PTA                            triphasic         +--------+------------------+-----+---------+--------+ DP                             triphasic         +--------+------------------+-----+---------+--------+ +--------+------------------+-----+---------+-------+ Left    Lt Pressure (mmHg)IndexWaveform Comment +--------+------------------+-----+---------+-------+ [id number]                    triphasic        +--------+------------------+-----+---------+-------+ PTA                            triphasic        +--------+------------------+-----+---------+-------+ DP                             triphasic        +--------+------------------+-----+---------+-------+  Right Doppler Findings: +--------+--------+-----+---------+--------+ Site    PressureIndexDoppler  Comments +--------+--------+-----+---------+--------+ [id number]          triphasic         +--------+--------+-----+---------+--------+ Radial               triphasic         +--------+--------+-----+---------+--------+ Ulnar                triphasic         +--------+--------+-----+---------+--------+  Left Doppler Findings: +--------+--------+-----+---------+--------+ Site    PressureIndexDoppler  Comments +--------+--------+-----+---------+--------+ [id number]          triphasic         +--------+--------+-----+---------+--------+ Radial               triphasic          +--------+--------+-----+---------+--------+ Ulnar                triphasic         +--------+--------+-----+---------+--------+  Summary: Right Carotid: Velocities in the right ICA are consistent with a 1-39% stenosis. Left Carotid: Velocities in the left ICA are consistent with a 1-39% stenosis. Vertebrals: Bilateral vertebral arteries demonstrate antegrade flow. Right Upper Extremity: Doppler waveforms remain within normal limits with right radial compression. Doppler waveforms remain within normal limits with right ulnar compression. Left Upper Extremity: Doppler waveforms remain within normal limits with left radial compression. Doppler waveforms remain within normal limits with left ulnar compression.     Preliminary     Cardiac Studies  ECHO Result status: Final result   ------------------------------------------------------------------- LV EF: 25% -   30%  ------------------------------------------------------------------- Indications:      CAD of native vessels 414.01.  ------------------------------------------------------------------- History:   PMH:  ectopic atrial/junctional rhythm.  Congestive heart failure.  Primary pulmonary hypertension.  ------------------------------------------------------------------- Study Conclusions  - Left ventricle: The cavity size was mildly dilated. Wall   thickness was normal. Systolic function was severely reduced. The   estimated ejection fraction was in the range of 25% to 30%.   Moderate diffuse hypokinesis with distinct regional wall motion   abnormalities. Akinesis and scarring of the basal-midinferior and   inferoseptal myocardium; consistent with infarction in the   distribution of the right coronary artery. Severe hypokinesis of   the apicalanteroseptal, anterior, and apical myocardium;   consistent with ischemia or infarction in the distribution of the   left anterior descending coronary artery. Relatively well    preserved inferolateral and anterolateral contractility. Features   are consistent with a pseudonormal left ventricular filling   pattern, with concomitant abnormal relaxation and increased   filling pressure (grade 2 diastolic dysfunction). Acoustic   contrast opacification revealed no evidence ofthrombus. - Mitral valve: There was mild to moderate regurgitation directed   centrally. - Left atrium: The atrium was moderately dilated. - Right ventricle: The cavity size was mildly dilated. - Right atrium: The atrium was mildly dilated. - Pulmonary arteries: Systolic pressure was mildly increased. PA   peak pressure: 39 mm Hg (S).  -------------------------------------------------------------------    MRI:   1. Mildly dilated LV with EF 28%. Wall motion abnormalities as noted above.  2. Mildly dilated RV with mildly decreased systolic function, EF 40%.  3.  At least moderate mitral regurgitation.  4. 50% wall thickness subendocardial LGE in the basal to mid anterior wall. Recovery of these wall segments with revascularization would be equivocal, but would expect other wall segments with wall motion abnormalities to improve with revascularization (significant viability).   Patient Profile     73 y.o. male with multivessel CAD, severe left ventricular systolic failure, acutely decompensated, severe hyponatremia, moderate to severe pulmonary artery hypertension, ectopic atrial/junctional rhythm  Assessment & Plan   ACUTE SYSTOLIC HF:    Continue IV Lasix today pending results of right heart cath.   CAD: MRI viability with positive viability as above.  Plan right heart cath and possible CABG on Friday .  Carotid and venous Dopplers today.  I have reviewed the cath films and he has reasonable targets for CABG.  Might need mitral valve repair.   RV DYSFUNCTION:  Right heart cath.   HYPONATREMIA:    Na is low but slightly better.     TOBACCO ABUSE:   On nicotine  patch  ABNORMAL CHEST CT:    He will need a repeat CT of his chest in six months.  Incidental finding of nodule.       For questions or updates, please contact [facility] Please consult [email or web address] for contact info under Cardiology/STEMI.   Signed, Rollene RotundaJames Brettany Sydney, MD  11/12/2018, 2:56 PM

## 2018-11-12 NOTE — Care Management Important Message (Signed)
Important Message  Patient Details  Name: DENZELLE FARD MRN: [id number] Date of Birth: 01/11/1946   Medicare Important Message Given:  Yes    Keylin Podolsky P Ebonye Reade 11/12/2018, 4:12 PM

## 2018-11-12 NOTE — Progress Notes (Signed)
CARDIAC REHAB PHASE I   Preop education completed with pt and wife. IS at bedside, pt demonstrated 2200. Pt educated on importance of IS, walks, and sternal precautions post-op. Pt given in-the-tube sheet, Cardiac Surgery booklet, and O[facility] care guide. Pts wife with concerns about d/c. Will continue to follow.  [id number] Reynold Bowen, RN BSN 11/12/2018 9:37 AM

## 2018-11-13 ENCOUNTER — Inpatient Hospital Stay (HOSPITAL_COMMUNITY)
Admission: EM | Disposition: A | Discharge status: Home Health | Payer: Self-pay | Source: Home / Self Care | Attending: Internal Medicine

## 2018-11-13 ENCOUNTER — Inpatient Hospital Stay (HOSPITAL_COMMUNITY): Payer: Medicare Other

## 2018-11-13 ENCOUNTER — Inpatient Hospital Stay (HOSPITAL_COMMUNITY): Payer: Medicare Other | Admitting: Anesthesiology

## 2018-11-13 ENCOUNTER — Encounter (HOSPITAL_COMMUNITY): Payer: Self-pay | Admitting: Certified Registered Nurse Anesthetist

## 2018-11-13 DIAGNOSIS — I2510 Atherosclerotic heart disease of native coronary artery without angina pectoris: Secondary | ICD-10-CM | POA: Diagnosis present

## 2018-11-13 HISTORY — PX: TEE WITHOUT CARDIOVERSION: SHX5443

## 2018-11-13 HISTORY — PX: CORONARY ARTERY BYPASS GRAFT: SHX141

## 2018-11-13 LAB — CBC
HCT: 33.1 % — ABNORMAL LOW (ref 39.0–52.0)
HCT: 27.2 % — ABNORMAL LOW (ref 39.0–52.0)
HCT: 25.2 % — ABNORMAL LOW (ref 39.0–52.0)
Hemoglobin: 11.3 g/dL — ABNORMAL LOW (ref 13.0–17.0)
Hemoglobin: 9.1 g/dL — ABNORMAL LOW (ref 13.0–17.0)
Hemoglobin: 8.5 g/dL — ABNORMAL LOW (ref 13.0–17.0)
MCH: 31.8 pg (ref 26.0–34.0)
MCH: 32.2 pg (ref 26.0–34.0)
MCH: 32.4 pg (ref 26.0–34.0)
MCHC: 34.1 g/dL (ref 30.0–36.0)
MCHC: 33.5 g/dL (ref 30.0–36.0)
MCHC: 33.7 g/dL (ref 30.0–36.0)
MCV: 93.2 fL (ref 80.0–100.0)
MCV: 96.1 fL (ref 80.0–100.0)
MCV: 96.2 fL (ref 80.0–100.0)
Platelets: 208 10*3/uL (ref 150–400)
Platelets: 123 10*3/uL — ABNORMAL LOW (ref 150–400)
Platelets: 155 10*3/uL (ref 150–400)
RBC: 3.55 MIL/uL — ABNORMAL LOW (ref 4.22–5.81)
RBC: 2.83 MIL/uL — ABNORMAL LOW (ref 4.22–5.81)
RBC: 2.62 MIL/uL — ABNORMAL LOW (ref 4.22–5.81)
RDW: 11.9 % (ref 11.5–15.5)
RDW: 12.1 % (ref 11.5–15.5)
RDW: 12 % (ref 11.5–15.5)
WBC: 6.7 10*3/uL (ref 4.0–10.5)
WBC: 14 10*3/uL — ABNORMAL HIGH (ref 4.0–10.5)
WBC: 13.2 10*3/uL — ABNORMAL HIGH (ref 4.0–10.5)
nRBC: 0 % (ref 0.0–0.2)
nRBC: 0 % (ref 0.0–0.2)
nRBC: 0 % (ref 0.0–0.2)

## 2018-11-13 LAB — POCT I-STAT 3, ART BLOOD GAS (G3+)
ACID-BASE DEFICIT: 3 mmol/L — AB (ref 0.0–2.0)
Acid-Base Excess: 3 mmol/L — ABNORMAL HIGH (ref 0.0–2.0)
Acid-base deficit: 1 mmol/L (ref 0.0–2.0)
Acid-base deficit: 3 mmol/L — ABNORMAL HIGH (ref 0.0–2.0)
Acid-base deficit: 3 mmol/L — ABNORMAL HIGH (ref 0.0–2.0)
Bicarbonate: 27.8 mmol/L (ref 20.0–28.0)
Bicarbonate: 24.7 mmol/L (ref 20.0–28.0)
Bicarbonate: 25 mmol/L (ref 20.0–28.0)
Bicarbonate: 23.4 mmol/L (ref 20.0–28.0)
Bicarbonate: 21.7 mmol/L (ref 20.0–28.0)
Bicarbonate: 21.5 mmol/L (ref 20.0–28.0)
O2 SAT: 100 %
O2 SAT: 99 %
O2 Saturation: 100 %
O2 Saturation: 99 %
O2 Saturation: 99 %
O2 Saturation: 98 %
PO2 ART: 154 mmHg — AB (ref 83.0–108.0)
Patient temperature: 35.7
Patient temperature: 35.8
Patient temperature: 36.9
Patient temperature: 37.1
TCO2: 29 mmol/L (ref 22–32)
TCO2: 26 mmol/L (ref 22–32)
TCO2: 26 mmol/L (ref 22–32)
TCO2: 25 mmol/L (ref 22–32)
TCO2: 23 mmol/L (ref 22–32)
TCO2: 23 mmol/L (ref 22–32)
pCO2 arterial: 41.8 mmHg (ref 32.0–48.0)
pCO2 arterial: 41.2 mmHg (ref 32.0–48.0)
pCO2 arterial: 46.4 mmHg (ref 32.0–48.0)
pCO2 arterial: 46.3 mmHg (ref 32.0–48.0)
pCO2 arterial: 36.8 mmHg (ref 32.0–48.0)
pCO2 arterial: 35.4 mmHg (ref 32.0–48.0)
pH, Arterial: 7.431 (ref 7.350–7.450)
pH, Arterial: 7.386 (ref 7.350–7.450)
pH, Arterial: 7.334 — ABNORMAL LOW (ref 7.350–7.450)
pH, Arterial: 7.305 — ABNORMAL LOW (ref 7.350–7.450)
pH, Arterial: 7.378 (ref 7.350–7.450)
pH, Arterial: 7.391 (ref 7.350–7.450)
pO2, Arterial: 398 mmHg — ABNORMAL HIGH (ref 83.0–108.0)
pO2, Arterial: 213 mmHg — ABNORMAL HIGH (ref 83.0–108.0)
pO2, Arterial: 162 mmHg — ABNORMAL HIGH (ref 83.0–108.0)
pO2, Arterial: 149 mmHg — ABNORMAL HIGH (ref 83.0–108.0)
pO2, Arterial: 98 mmHg (ref 83.0–108.0)

## 2018-11-13 LAB — POCT I-STAT, CHEM 8
BUN: 11 mg/dL (ref 8–23)
BUN: 9 mg/dL (ref 8–23)
BUN: 8 mg/dL (ref 8–23)
BUN: 8 mg/dL (ref 8–23)
BUN: 8 mg/dL (ref 8–23)
Calcium, Ion: 1.21 mmol/L (ref 1.15–1.40)
Calcium, Ion: 1.21 mmol/L (ref 1.15–1.40)
Calcium, Ion: 1.06 mmol/L — ABNORMAL LOW (ref 1.15–1.40)
Calcium, Ion: 1.34 mmol/L (ref 1.15–1.40)
Calcium, Ion: 1.2 mmol/L (ref 1.15–1.40)
Chloride: 91 mmol/L — ABNORMAL LOW (ref 98–111)
Chloride: 93 mmol/L — ABNORMAL LOW (ref 98–111)
Chloride: 92 mmol/L — ABNORMAL LOW (ref 98–111)
Chloride: 95 mmol/L — ABNORMAL LOW (ref 98–111)
Chloride: 99 mmol/L (ref 98–111)
Creatinine, Ser: 0.6 mg/dL — ABNORMAL LOW (ref 0.61–1.24)
Creatinine, Ser: 0.7 mg/dL (ref 0.61–1.24)
Creatinine, Ser: 0.5 mg/dL — ABNORMAL LOW (ref 0.61–1.24)
Creatinine, Ser: 0.6 mg/dL — ABNORMAL LOW (ref 0.61–1.24)
Creatinine, Ser: 0.6 mg/dL — ABNORMAL LOW (ref 0.61–1.24)
Glucose, Bld: 112 mg/dL — ABNORMAL HIGH (ref 70–99)
Glucose, Bld: 108 mg/dL — ABNORMAL HIGH (ref 70–99)
Glucose, Bld: 99 mg/dL (ref 70–99)
Glucose, Bld: 148 mg/dL — ABNORMAL HIGH (ref 70–99)
Glucose, Bld: 134 mg/dL — ABNORMAL HIGH (ref 70–99)
HCT: 35 % — ABNORMAL LOW (ref 39.0–52.0)
HCT: 32 % — ABNORMAL LOW (ref 39.0–52.0)
HCT: 29 % — ABNORMAL LOW (ref 39.0–52.0)
HCT: 27 % — ABNORMAL LOW (ref 39.0–52.0)
HCT: 25 % — ABNORMAL LOW (ref 39.0–52.0)
HEMOGLOBIN: 9.9 g/dL — AB (ref 13.0–17.0)
Hemoglobin: 11.9 g/dL — ABNORMAL LOW (ref 13.0–17.0)
Hemoglobin: 10.9 g/dL — ABNORMAL LOW (ref 13.0–17.0)
Hemoglobin: 9.2 g/dL — ABNORMAL LOW (ref 13.0–17.0)
Hemoglobin: 8.5 g/dL — ABNORMAL LOW (ref 13.0–17.0)
Potassium: 3.7 mmol/L (ref 3.5–5.1)
Potassium: 3.8 mmol/L (ref 3.5–5.1)
Potassium: 3.4 mmol/L — ABNORMAL LOW (ref 3.5–5.1)
Potassium: 4 mmol/L (ref 3.5–5.1)
Potassium: 4.7 mmol/L (ref 3.5–5.1)
Sodium: 129 mmol/L — ABNORMAL LOW (ref 135–145)
Sodium: 128 mmol/L — ABNORMAL LOW (ref 135–145)
Sodium: 130 mmol/L — ABNORMAL LOW (ref 135–145)
Sodium: 129 mmol/L — ABNORMAL LOW (ref 135–145)
Sodium: 131 mmol/L — ABNORMAL LOW (ref 135–145)
TCO2: 29 mmol/L (ref 22–32)
TCO2: 27 mmol/L (ref 22–32)
TCO2: 28 mmol/L (ref 22–32)
TCO2: 25 mmol/L (ref 22–32)
TCO2: 23 mmol/L (ref 22–32)

## 2018-11-13 LAB — PLATELET COUNT: Platelets: 152 10*3/uL (ref 150–400)

## 2018-11-13 LAB — APTT: APTT: 33 s (ref 24–36)

## 2018-11-13 LAB — BASIC METABOLIC PANEL
Anion gap: 12 (ref 5–15)
BUN: 12 mg/dL (ref 8–23)
CO2: 23 mmol/L (ref 22–32)
CREATININE: 0.82 mg/dL (ref 0.61–1.24)
Calcium: 8.9 mg/dL (ref 8.9–10.3)
Chloride: 93 mmol/L — ABNORMAL LOW (ref 98–111)
GFR calc Af Amer: >60 mL/min (ref >60)
GFR calc non Af Amer: >60 mL/min (ref >60)
Glucose, Bld: 106 mg/dL — ABNORMAL HIGH (ref 70–99)
Potassium: 3.6 mmol/L (ref 3.5–5.1)
Sodium: 128 mmol/L — ABNORMAL LOW (ref 135–145)

## 2018-11-13 LAB — HEMOGLOBIN AND HEMATOCRIT, BLOOD
HCT: 26.5 % — ABNORMAL LOW (ref 39.0–52.0)
Hemoglobin: 9 g/dL — ABNORMAL LOW (ref 13.0–17.0)

## 2018-11-13 LAB — CREATININE, SERUM
Creatinine, Ser: 0.79 mg/dL (ref 0.61–1.24)
GFR calc Af Amer: >60 mL/min (ref >60)
GFR calc non Af Amer: >60 mL/min (ref >60)

## 2018-11-13 LAB — PREPARE RBC (CROSSMATCH)

## 2018-11-13 LAB — PROTIME-INR
INR: 1.46
Prothrombin Time: 17.6 seconds — ABNORMAL HIGH (ref 11.4–15.2)

## 2018-11-13 LAB — MAGNESIUM: Magnesium: 2.6 mg/dL — ABNORMAL HIGH (ref 1.7–2.4)

## 2018-11-13 SURGERY — CORONARY ARTERY BYPASS GRAFTING (CABG)
Anesthesia: General | Site: Chest

## 2018-11-13 MED ORDER — LACTATED RINGERS IV SOLN
INTRAVENOUS | Status: DC
  Administered 2018-11-13: 17:00:00 via INTRAVENOUS

## 2018-11-13 MED ORDER — VANCOMYCIN HCL IN DEXTROSE 1-5 GM/200ML-% IV SOLN
1000.0000 mg | Freq: Once | INTRAVENOUS | Status: AC
  Administered 2018-11-13: 1000 mg via INTRAVENOUS
  Filled 2018-11-13: qty 200

## 2018-11-13 MED ORDER — MILRINONE LACTATE IN DEXTROSE 20-5 MG/100ML-% IV SOLN
0.2500 ug/kg/min | INTRAVENOUS | Status: DC
  Administered 2018-11-14: 0.25 ug/kg/min via INTRAVENOUS
  Filled 2018-11-13: qty 100

## 2018-11-13 MED ORDER — LIDOCAINE 2% (20 MG/ML) 5 ML SYRINGE
INTRAMUSCULAR | Status: DC | PRN
  Administered 2018-11-13: 100 mg via INTRAVENOUS

## 2018-11-13 MED ORDER — ACETAMINOPHEN 160 MG/5ML PO SOLN: 1000.0000 mg | Freq: Four times a day (QID) | ORAL | Status: AC

## 2018-11-13 MED ORDER — EPINEPHRINE PF 1 MG/ML IJ SOLN: 0.0000 ug/min | INTRAVENOUS | Status: DC

## 2018-11-13 MED ORDER — LIDOCAINE HCL (PF) 2 % IJ SOLN
100.0000 mg | Freq: Once | INTRAMUSCULAR | Status: AC
  Administered 2018-11-13: 100 mg

## 2018-11-13 MED ORDER — ACETAMINOPHEN 500 MG PO TABS
1000.0000 mg | ORAL_TABLET | Freq: Four times a day (QID) | ORAL | Status: AC
  Administered 2018-11-14 – 2018-11-18 (×20): 1000 mg via ORAL
  Filled 2018-11-13 (×20): qty 2

## 2018-11-13 MED ORDER — FAMOTIDINE IN NACL 20-0.9 MG/50ML-% IV SOLN
20.0000 mg | Freq: Two times a day (BID) | INTRAVENOUS | Status: AC
  Administered 2018-11-13: 20 mg via INTRAVENOUS

## 2018-11-13 MED ORDER — EPHEDRINE SULFATE-NACL 50-0.9 MG/10ML-% IV SOSY
PREFILLED_SYRINGE | INTRAVENOUS | Status: DC | PRN
  Administered 2018-11-13: 15 mg via INTRAVENOUS
  Administered 2018-11-13: 10 mg via INTRAVENOUS
  Administered 2018-11-13 (×2): 5 mg via INTRAVENOUS
  Administered 2018-11-13: 10 mg via INTRAVENOUS

## 2018-11-13 MED ORDER — EPINEPHRINE PF 1 MG/ML IJ SOLN
0.0000 ug/min | INTRAVENOUS | Status: DC
  Administered 2018-11-13: 1 ug/min via INTRAVENOUS
  Filled 2018-11-13: qty 4

## 2018-11-13 MED ORDER — ACETAMINOPHEN 650 MG RE SUPP
650.0000 mg | Freq: Once | RECTAL | Status: AC
  Administered 2018-11-13: 650 mg via RECTAL

## 2018-11-13 MED ORDER — LACTATED RINGERS IV SOLN
INTRAVENOUS | Status: DC | PRN
  Administered 2018-11-13 (×2): via INTRAVENOUS

## 2018-11-13 MED ORDER — MORPHINE SULFATE (PF) 2 MG/ML IV SOLN
1.0000 mg | INTRAVENOUS | Status: DC | PRN
  Administered 2018-11-14: 2 mg via INTRAVENOUS
  Filled 2018-11-13: qty 1

## 2018-11-13 MED ORDER — CHLORHEXIDINE GLUCONATE 0.12 % MT SOLN
15.0000 mL | OROMUCOSAL | Status: AC
  Administered 2018-11-13: 15 mL via OROMUCOSAL

## 2018-11-13 MED ORDER — HEPARIN SODIUM (PORCINE) 1000 UNIT/ML IJ SOLN
INTRAMUSCULAR | Status: AC
  Filled 2018-11-13: qty 1

## 2018-11-13 MED ORDER — HEPARIN SODIUM (PORCINE) 1000 UNIT/ML IJ SOLN
INTRAMUSCULAR | Status: DC | PRN
  Administered 2018-11-13: 27000 [IU] via INTRAVENOUS
  Administered 2018-11-13: 3000 [IU] via INTRAVENOUS

## 2018-11-13 MED ORDER — NITROGLYCERIN 0.2 MG/ML ON CALL CATH LAB
INTRAVENOUS | Status: DC | PRN
  Administered 2018-11-13 (×2): 40 ug via INTRAVENOUS

## 2018-11-13 MED ORDER — METOPROLOL TARTRATE 5 MG/5ML IV SOLN
2.5000 mg | INTRAVENOUS | Status: DC | PRN
  Administered 2018-11-13: 2.5 mg via INTRAVENOUS

## 2018-11-13 MED ORDER — SODIUM CHLORIDE 0.45 % IV SOLN
INTRAVENOUS | Status: DC | PRN
  Administered 2018-11-13: 15:00:00 via INTRAVENOUS

## 2018-11-13 MED ORDER — ORAL CARE MOUTH RINSE
15.0000 mL | OROMUCOSAL | Status: DC
  Administered 2018-11-13 – 2018-11-14 (×6): 15 mL via OROMUCOSAL

## 2018-11-13 MED ORDER — PANTOPRAZOLE SODIUM 40 MG PO TBEC
40.0000 mg | DELAYED_RELEASE_TABLET | Freq: Every day | ORAL | Status: DC
  Administered 2018-11-15 – 2018-11-19 (×5): 40 mg via ORAL
  Filled 2018-11-13 (×5): qty 1

## 2018-11-13 MED ORDER — LACTATED RINGERS IV SOLN
INTRAVENOUS | Status: DC
  Administered 2018-11-13: 15:00:00 via INTRAVENOUS

## 2018-11-13 MED ORDER — DEXMEDETOMIDINE HCL IN NACL 200 MCG/50ML IV SOLN
0.0000 ug/kg/h | INTRAVENOUS | Status: DC
  Filled 2018-11-13: qty 50

## 2018-11-13 MED ORDER — ALBUMIN HUMAN 5 % IV SOLN
250.0000 mL | INTRAVENOUS | Status: AC | PRN
  Administered 2018-11-13 (×4): 12.5 g via INTRAVENOUS
  Filled 2018-11-13 (×2): qty 250

## 2018-11-13 MED ORDER — METOCLOPRAMIDE HCL 5 MG/ML IJ SOLN
10.0000 mg | Freq: Four times a day (QID) | INTRAMUSCULAR | Status: AC
  Administered 2018-11-13 – 2018-11-18 (×16): 10 mg via INTRAVENOUS
  Filled 2018-11-13 (×17): qty 2

## 2018-11-13 MED ORDER — PHENYLEPHRINE 40 MCG/ML (10ML) SYRINGE FOR IV PUSH (FOR BLOOD PRESSURE SUPPORT)
PREFILLED_SYRINGE | INTRAVENOUS | Status: DC | PRN
  Administered 2018-11-13 (×2): 80 ug via INTRAVENOUS

## 2018-11-13 MED ORDER — ALBUMIN HUMAN 5 % IV SOLN
INTRAVENOUS | Status: AC
  Administered 2018-11-14: 12.5 g via INTRAVENOUS
  Filled 2018-11-13: qty 250

## 2018-11-13 MED ORDER — TRAMADOL HCL 50 MG PO TABS
50.0000 mg | ORAL_TABLET | ORAL | Status: DC | PRN
  Administered 2018-11-16: 50 mg via ORAL
  Filled 2018-11-13: qty 1

## 2018-11-13 MED ORDER — SODIUM CHLORIDE 0.9 % IV SOLN
1.5000 g | Freq: Two times a day (BID) | INTRAVENOUS | Status: AC
  Administered 2018-11-13 – 2018-11-15 (×4): 1.5 g via INTRAVENOUS
  Filled 2018-11-13 (×4): qty 1.5

## 2018-11-13 MED ORDER — PROTAMINE SULFATE 10 MG/ML IV SOLN
INTRAVENOUS | Status: AC
  Filled 2018-11-13: qty 5

## 2018-11-13 MED ORDER — SODIUM CHLORIDE (PF) 0.9 % IJ SOLN
OROMUCOSAL | Status: DC | PRN
  Administered 2018-11-13 (×3): 4 mL via TOPICAL

## 2018-11-13 MED ORDER — INSULIN REGULAR BOLUS VIA INFUSION
0.0000 [IU] | Freq: Three times a day (TID) | INTRAVENOUS | Status: DC
  Filled 2018-11-13: qty 10

## 2018-11-13 MED ORDER — ROCURONIUM BROMIDE 50 MG/5ML IV SOSY
PREFILLED_SYRINGE | INTRAVENOUS | Status: AC
  Filled 2018-11-13: qty 15

## 2018-11-13 MED ORDER — DOCUSATE SODIUM 100 MG PO CAPS
200.0000 mg | ORAL_CAPSULE | Freq: Every day | ORAL | Status: DC
  Administered 2018-11-14 – 2018-11-17 (×4): 200 mg via ORAL
  Filled 2018-11-13 (×6): qty 2

## 2018-11-13 MED ORDER — MIDAZOLAM HCL 5 MG/5ML IJ SOLN
INTRAMUSCULAR | Status: DC | PRN
  Administered 2018-11-13 (×2): 1 mg via INTRAVENOUS
  Administered 2018-11-13 (×3): 2 mg via INTRAVENOUS

## 2018-11-13 MED ORDER — FENTANYL CITRATE (PF) 250 MCG/5ML IJ SOLN
INTRAMUSCULAR | Status: AC
  Filled 2018-11-13: qty 25

## 2018-11-13 MED ORDER — SODIUM CHLORIDE 0.9 % IV SOLN
INTRAVENOUS | Status: DC | PRN
  Administered 2018-11-13: 14:00:00 via INTRAVENOUS

## 2018-11-13 MED ORDER — POTASSIUM CHLORIDE 10 MEQ/50ML IV SOLN
10.0000 meq | INTRAVENOUS | Status: AC
  Administered 2018-11-13 (×3): 10 meq via INTRAVENOUS

## 2018-11-13 MED ORDER — METOPROLOL TARTRATE 25 MG/10 ML ORAL SUSPENSION: 12.5000 mg | Freq: Two times a day (BID) | ORAL | Status: DC

## 2018-11-13 MED ORDER — 0.9 % SODIUM CHLORIDE (POUR BTL) OPTIME
TOPICAL | Status: DC | PRN
  Administered 2018-11-13: 6000 mL

## 2018-11-13 MED ORDER — SODIUM CHLORIDE 0.9 % IV SOLN: 250.0000 mL | INTRAVENOUS | Status: DC

## 2018-11-13 MED ORDER — THROMBIN 20000 UNITS EX SOLR: OROMUCOSAL | Status: DC | PRN

## 2018-11-13 MED ORDER — MIDAZOLAM HCL (PF) 10 MG/2ML IJ SOLN
INTRAMUSCULAR | Status: AC
  Filled 2018-11-13: qty 2

## 2018-11-13 MED ORDER — NITROGLYCERIN IN D5W 200-5 MCG/ML-% IV SOLN: 0.0000 ug/min | INTRAVENOUS | Status: DC

## 2018-11-13 MED ORDER — PROPOFOL 10 MG/ML IV BOLUS
INTRAVENOUS | Status: AC
  Filled 2018-11-13: qty 20

## 2018-11-13 MED ORDER — MAGNESIUM SULFATE 4 GM/100ML IV SOLN
4.0000 g | Freq: Once | INTRAVENOUS | Status: AC
  Administered 2018-11-13: 4 g via INTRAVENOUS

## 2018-11-13 MED ORDER — FENTANYL CITRATE (PF) 250 MCG/5ML IJ SOLN
INTRAMUSCULAR | Status: DC | PRN
  Administered 2018-11-13: 150 ug via INTRAVENOUS
  Administered 2018-11-13: 100 ug via INTRAVENOUS
  Administered 2018-11-13 (×2): 150 ug via INTRAVENOUS
  Administered 2018-11-13: 50 ug via INTRAVENOUS
  Administered 2018-11-13: 100 ug via INTRAVENOUS
  Administered 2018-11-13: 50 ug via INTRAVENOUS
  Administered 2018-11-13: 150 ug via INTRAVENOUS
  Administered 2018-11-13: 200 ug via INTRAVENOUS
  Administered 2018-11-13: 150 ug via INTRAVENOUS

## 2018-11-13 MED ORDER — PROPOFOL 10 MG/ML IV BOLUS
INTRAVENOUS | Status: DC | PRN
  Administered 2018-11-13: 40 mg via INTRAVENOUS
  Administered 2018-11-13 (×3): 20 mg via INTRAVENOUS
  Administered 2018-11-13: 30 mg via INTRAVENOUS
  Administered 2018-11-13: 50 mg via INTRAVENOUS
  Administered 2018-11-13: 20 mg via INTRAVENOUS

## 2018-11-13 MED ORDER — ROCURONIUM BROMIDE 10 MG/ML (PF) SYRINGE
PREFILLED_SYRINGE | INTRAVENOUS | Status: DC | PRN
  Administered 2018-11-13: 100 mg via INTRAVENOUS
  Administered 2018-11-13 (×2): 50 mg via INTRAVENOUS
  Administered 2018-11-13: 100 mg via INTRAVENOUS

## 2018-11-13 MED ORDER — LACTATED RINGERS IV SOLN
INTRAVENOUS | Status: DC | PRN
  Administered 2018-11-13: 07:00:00 via INTRAVENOUS

## 2018-11-13 MED ORDER — PHENYLEPHRINE HCL-NACL 20-0.9 MG/250ML-% IV SOLN
0.0000 ug/min | INTRAVENOUS | Status: DC
  Administered 2018-11-13: 40 ug/min via INTRAVENOUS
  Administered 2018-11-13: 90 ug/min via INTRAVENOUS
  Administered 2018-11-13: 40 ug/min via INTRAVENOUS
  Administered 2018-11-13: 70 ug/min via INTRAVENOUS
  Administered 2018-11-14: 80 ug/min via INTRAVENOUS
  Filled 2018-11-13 (×5): qty 250

## 2018-11-13 MED ORDER — CHLORHEXIDINE GLUCONATE 0.12% ORAL RINSE (MEDLINE KIT)
15.0000 mL | Freq: Two times a day (BID) | OROMUCOSAL | Status: DC
  Administered 2018-11-13 – 2018-11-14 (×2): 15 mL via OROMUCOSAL

## 2018-11-13 MED ORDER — INSULIN REGULAR(HUMAN) IN NACL 100-0.9 UT/100ML-% IV SOLN: INTRAVENOUS | Status: DC

## 2018-11-13 MED ORDER — ETOMIDATE 2 MG/ML IV SOLN
INTRAVENOUS | Status: DC | PRN
  Administered 2018-11-13: 18 mg via INTRAVENOUS

## 2018-11-13 MED ORDER — METOPROLOL TARTRATE 12.5 MG HALF TABLET
12.5000 mg | ORAL_TABLET | Freq: Two times a day (BID) | ORAL | Status: DC
  Administered 2018-11-14: 12.5 mg via ORAL
  Filled 2018-11-13: qty 1

## 2018-11-13 MED ORDER — MIDAZOLAM HCL 2 MG/2ML IJ SOLN: 2.0000 mg | INTRAMUSCULAR | Status: DC | PRN

## 2018-11-13 MED ORDER — OXYCODONE HCL 5 MG PO TABS
5.0000 mg | ORAL_TABLET | ORAL | Status: DC | PRN
  Administered 2018-11-16: 5 mg via ORAL
  Filled 2018-11-13: qty 1

## 2018-11-13 MED ORDER — ALBUMIN HUMAN 5 % IV SOLN
12.5000 g | INTRAVENOUS | Status: AC | PRN
  Administered 2018-11-14 (×2): 12.5 g via INTRAVENOUS
  Filled 2018-11-13: qty 250

## 2018-11-13 MED ORDER — ASPIRIN 81 MG PO CHEW: 324.0000 mg | CHEWABLE_TABLET | Freq: Every day | ORAL | Status: DC

## 2018-11-13 MED ORDER — PROTAMINE SULFATE 10 MG/ML IV SOLN
INTRAVENOUS | Status: AC
  Filled 2018-11-13: qty 25

## 2018-11-13 MED ORDER — SODIUM CHLORIDE 0.9% FLUSH: 10.0000 mL | INTRAVENOUS | Status: DC | PRN

## 2018-11-13 MED ORDER — HEMOSTATIC AGENTS (NO CHARGE) OPTIME
TOPICAL | Status: DC | PRN
  Administered 2018-11-13: 1 via TOPICAL

## 2018-11-13 MED ORDER — ACETAMINOPHEN 160 MG/5ML PO SOLN: 650.0000 mg | Freq: Once | ORAL | Status: AC

## 2018-11-13 MED ORDER — LACTATED RINGERS IV SOLN: 500.0000 mL | Freq: Once | INTRAVENOUS | Status: DC | PRN

## 2018-11-13 MED ORDER — PROTAMINE SULFATE 10 MG/ML IV SOLN
INTRAVENOUS | Status: DC | PRN
  Administered 2018-11-13: 290 mg via INTRAVENOUS
  Administered 2018-11-13: 10 mg via INTRAVENOUS

## 2018-11-13 MED ORDER — MAGNESIUM SULFATE 4 GM/100ML IV SOLN
INTRAVENOUS | Status: AC
  Administered 2018-11-13: 4 g via INTRAVENOUS
  Filled 2018-11-13: qty 100

## 2018-11-13 MED ORDER — PLASMA-LYTE 148 IV SOLN
INTRAVENOUS | Status: DC | PRN
  Administered 2018-11-13 (×2): 500 mL via INTRAVASCULAR

## 2018-11-13 MED ORDER — SODIUM CHLORIDE 0.9% FLUSH
10.0000 mL | Freq: Two times a day (BID) | INTRAVENOUS | Status: DC
  Administered 2018-11-13 – 2018-11-15 (×3): 10 mL

## 2018-11-13 MED ORDER — SODIUM CHLORIDE 0.9% FLUSH: 3.0000 mL | INTRAVENOUS | Status: DC | PRN

## 2018-11-13 MED ORDER — ONDANSETRON HCL 4 MG/2ML IJ SOLN: 4.0000 mg | Freq: Four times a day (QID) | INTRAMUSCULAR | Status: DC | PRN

## 2018-11-13 MED ORDER — EPHEDRINE 5 MG/ML INJ
INTRAVENOUS | Status: AC
  Filled 2018-11-13: qty 50

## 2018-11-13 MED ORDER — BISACODYL 10 MG RE SUPP: 10.0000 mg | Freq: Every day | RECTAL | Status: DC

## 2018-11-13 MED ORDER — SODIUM CHLORIDE 0.9% FLUSH
3.0000 mL | Freq: Two times a day (BID) | INTRAVENOUS | Status: DC
  Administered 2018-11-14 (×2): 3 mL via INTRAVENOUS

## 2018-11-13 MED ORDER — SODIUM CHLORIDE 0.9 % IV SOLN
INTRAVENOUS | Status: DC
  Administered 2018-11-13: 15:00:00 via INTRAVENOUS

## 2018-11-13 MED ORDER — ASPIRIN EC 325 MG PO TBEC
325.0000 mg | DELAYED_RELEASE_TABLET | Freq: Every day | ORAL | Status: DC
  Administered 2018-11-14 – 2018-11-19 (×6): 325 mg via ORAL
  Filled 2018-11-13 (×6): qty 1

## 2018-11-13 MED ORDER — CHLORHEXIDINE GLUCONATE CLOTH 2 % EX PADS
6.0000 | MEDICATED_PAD | Freq: Every day | CUTANEOUS | Status: DC
  Administered 2018-11-13 – 2018-11-15 (×2): 6 via TOPICAL

## 2018-11-13 MED ORDER — BISACODYL 5 MG PO TBEC
10.0000 mg | DELAYED_RELEASE_TABLET | Freq: Every day | ORAL | Status: DC
  Administered 2018-11-14 – 2018-11-17 (×4): 10 mg via ORAL
  Filled 2018-11-13 (×6): qty 2

## 2018-11-13 MED ORDER — ALBUMIN HUMAN 5 % IV SOLN
INTRAVENOUS | Status: DC | PRN
  Administered 2018-11-13 (×2): via INTRAVENOUS

## 2018-11-13 SURGICAL SUPPLY — 128 items
ADAPTER CARDIO PERF ANTE/RETRO (ADAPTER) ×8 IMPLANT
APPLICATOR COTTON TIP 6IN STRL (MISCELLANEOUS) IMPLANT
BAG DECANTER FOR FLEXI CONT (MISCELLANEOUS) ×8 IMPLANT
BANDAGE ACE 4X5 VEL STRL LF (GAUZE/BANDAGES/DRESSINGS) ×4 IMPLANT
BANDAGE ACE 6X5 VEL STRL LF (GAUZE/BANDAGES/DRESSINGS) ×4 IMPLANT
BANDAGE ELASTIC 4 VELCRO ST LF (GAUZE/BANDAGES/DRESSINGS) ×4 IMPLANT
BANDAGE ELASTIC 6 VELCRO ST LF (GAUZE/BANDAGES/DRESSINGS) ×4 IMPLANT
BASKET HEART  (ORDER IN 25'S) (MISCELLANEOUS) ×1
BASKET HEART (ORDER IN 25'S) (MISCELLANEOUS) ×1
BASKET HEART (ORDER IN 25S) (MISCELLANEOUS) ×2 IMPLANT
BLADE CLIPPER SURG (BLADE) IMPLANT
BLADE MINI RND TIP GREEN BEAV (BLADE) ×2 IMPLANT
BLADE STERNUM SYSTEM 6 (BLADE) ×8 IMPLANT
BLADE SURG 11 STRL SS (BLADE) ×2 IMPLANT
BLADE SURG 12 STRL SS (BLADE) ×8 IMPLANT
BLADE SURG 15 STRL LF DISP TIS (BLADE) ×2 IMPLANT
BLADE SURG 15 STRL SS (BLADE) ×2
BNDG GAUZE ELAST 4 BULKY (GAUZE/BANDAGES/DRESSINGS) ×6 IMPLANT
BOOT SUTURE AID YELLOW STND (SUTURE) IMPLANT
CANISTER SUCT 3000ML PPV (MISCELLANEOUS) ×6 IMPLANT
CANNULA GUNDRY RCSP 15FR (MISCELLANEOUS) ×6 IMPLANT
CANNULA SUMP PERICARDIAL (CANNULA) ×2 IMPLANT
CATH CPB KIT VANTRIGT (MISCELLANEOUS) ×4 IMPLANT
CATH ROBINSON RED A/P 18FR (CATHETERS) ×18 IMPLANT
CATH THORACIC 36FR RT ANG (CATHETERS) ×6 IMPLANT
CLIP VESOCCLUDE SM WIDE 24/CT (CLIP) ×4 IMPLANT
CONN 1/2X1/2X1/2  BEN (MISCELLANEOUS) ×2
CONN 1/2X1/2X1/2 BEN (MISCELLANEOUS) ×2 IMPLANT
CONN 3/8X1/2 ST GISH (MISCELLANEOUS) ×8 IMPLANT
COVER SURGICAL LIGHT HANDLE (MISCELLANEOUS) ×4 IMPLANT
COVER WAND RF STERILE (DRAPES) ×6 IMPLANT
CRADLE DONUT ADULT HEAD (MISCELLANEOUS) ×6 IMPLANT
DERMABOND ADVANCED (GAUZE/BANDAGES/DRESSINGS) ×4
DERMABOND ADVANCED .7 DNX12 (GAUZE/BANDAGES/DRESSINGS) IMPLANT
DRAIN CHANNEL 32F RND 10.7 FF (WOUND CARE) ×4 IMPLANT
DRAPE CARDIOVASCULAR INCISE (DRAPES) ×2
DRAPE SLUSH/WARMER DISC (DRAPES) ×6 IMPLANT
DRAPE SRG 135X102X78XABS (DRAPES) ×4 IMPLANT
DRSG AQUACEL AG ADV 3.5X14 (GAUZE/BANDAGES/DRESSINGS) ×6 IMPLANT
ELECT BLADE 4.0 EZ CLEAN MEGAD (MISCELLANEOUS) ×4
ELECT BLADE 6.5 EXT (BLADE) ×8 IMPLANT
ELECT CAUTERY BLADE 6.4 (BLADE) ×8 IMPLANT
ELECT REM PT RETURN 9FT ADLT (ELECTROSURGICAL) ×8
ELECTRODE BLDE 4.0 EZ CLN MEGD (MISCELLANEOUS) ×2 IMPLANT
ELECTRODE REM PT RTRN 9FT ADLT (ELECTROSURGICAL) ×8 IMPLANT
FELT TEFLON 1X6 (MISCELLANEOUS) ×10 IMPLANT
GAUZE SPONGE 4X4 12PLY STRL (GAUZE/BANDAGES/DRESSINGS) ×12 IMPLANT
GAUZE SPONGE 4X4 12PLY STRL LF (GAUZE/BANDAGES/DRESSINGS) ×6 IMPLANT
GLOVE BIO SURGEON STRL SZ 6 (GLOVE) ×6 IMPLANT
GLOVE BIO SURGEON STRL SZ 6.5 (GLOVE) ×5 IMPLANT
GLOVE BIO SURGEON STRL SZ7.5 (GLOVE) ×18 IMPLANT
GLOVE BIO SURGEONS STRL SZ 6.5 (GLOVE) ×5
GOWN STRL REUS W/ TWL LRG LVL3 (GOWN DISPOSABLE) ×16 IMPLANT
GOWN STRL REUS W/TWL LRG LVL3 (GOWN DISPOSABLE) ×22
HEMOSTAT POWDER SURGIFOAM 1G (HEMOSTASIS) ×18 IMPLANT
HEMOSTAT SURGICEL 2X14 (HEMOSTASIS) ×6 IMPLANT
INSERT FOGARTY XLG (MISCELLANEOUS) ×2 IMPLANT
KIT BASIN OR (CUSTOM PROCEDURE TRAY) ×6 IMPLANT
KIT SUCTION CATH 14FR (SUCTIONS) ×6 IMPLANT
KIT TURNOVER KIT B (KITS) ×6 IMPLANT
KIT VASOVIEW ACCESSORY VH 2004 (KITS) ×2 IMPLANT
KIT VASOVIEW HEMOPRO 2 VH 4000 (KITS) ×4 IMPLANT
LEAD PACING MYOCARDI (MISCELLANEOUS) ×4 IMPLANT
LINE VENT (MISCELLANEOUS) ×2 IMPLANT
LOOP VESSEL SUPERMAXI WHITE (MISCELLANEOUS) ×2 IMPLANT
MARKER GRAFT CORONARY BYPASS (MISCELLANEOUS) ×12 IMPLANT
NS IRRIG 1000ML POUR BTL (IV SOLUTION) ×34 IMPLANT
PACK E OPEN HEART (SUTURE) ×6 IMPLANT
PACK OPEN HEART (CUSTOM PROCEDURE TRAY) ×6 IMPLANT
PAD ARMBOARD 7.5X6 YLW CONV (MISCELLANEOUS) ×12 IMPLANT
PAD ELECT DEFIB RADIOL ZOLL (MISCELLANEOUS) ×4 IMPLANT
PENCIL BUTTON HOLSTER BLD 10FT (ELECTRODE) ×4 IMPLANT
PUNCH AORTIC ROTATE  4.5MM 8IN (MISCELLANEOUS) ×2 IMPLANT
PUNCH AORTIC ROTATE 4.0MM (MISCELLANEOUS) IMPLANT
PUNCH AORTIC ROTATE 4.5MM 8IN (MISCELLANEOUS) IMPLANT
PUNCH AORTIC ROTATE 5MM 8IN (MISCELLANEOUS) IMPLANT
SET CARDIOPLEGIA MPS 5001102 (MISCELLANEOUS) ×2 IMPLANT
SHEATH PINNACLE 5F 10CM (SHEATH) ×2 IMPLANT
SPONGE LAP 18X18 RF (DISPOSABLE) ×2 IMPLANT
SPONGE LAP 18X18 X RAY DECT (DISPOSABLE) ×8 IMPLANT
SPONGE LAP 4X18 RFD (DISPOSABLE) ×4 IMPLANT
STOPCOCK 4 WAY LG BORE MALE ST (IV SETS) ×4 IMPLANT
SUCKER WEIGHTED FLEX (MISCELLANEOUS) ×4 IMPLANT
SURGIFLO W/THROMBIN 8M KIT (HEMOSTASIS) ×4 IMPLANT
SUT BONE WAX W31G (SUTURE) ×6 IMPLANT
SUT ETHIBOND 2 0 SH (SUTURE) ×6 IMPLANT
SUT ETHIBOND 2 0 SH 36X2 (SUTURE) ×4 IMPLANT
SUT ETHIBOND 2 0 V4 (SUTURE) IMPLANT
SUT ETHIBOND 2 0V4 GREEN (SUTURE) IMPLANT
SUT ETHIBOND 4 0 TF (SUTURE) IMPLANT
SUT ETHIBOND 5 0 C 1 30 (SUTURE) IMPLANT
SUT MNCRL AB 4-0 PS2 18 (SUTURE) ×4 IMPLANT
SUT PROLENE 3 0 RB 1 (SUTURE) ×4 IMPLANT
SUT PROLENE 3 0 SH 1 (SUTURE) ×4 IMPLANT
SUT PROLENE 3 0 SH DA (SUTURE) ×4 IMPLANT
SUT PROLENE 3 0 SH1 36 (SUTURE) ×2 IMPLANT
SUT PROLENE 4 0 RB 1 (SUTURE) ×6
SUT PROLENE 4 0 SH DA (SUTURE) ×8 IMPLANT
SUT PROLENE 4-0 RB1 .5 CRCL 36 (SUTURE) ×6 IMPLANT
SUT PROLENE 5 0 C 1 36 (SUTURE) IMPLANT
SUT PROLENE 6 0 C 1 30 (SUTURE) ×10 IMPLANT
SUT PROLENE 6 0 CC (SUTURE) ×18 IMPLANT
SUT PROLENE 8 0 BV175 6 (SUTURE) ×6 IMPLANT
SUT PROLENE BLUE 7 0 (SUTURE) ×6 IMPLANT
SUT PROLENE POLY MONO (SUTURE) ×6 IMPLANT
SUT SILK  1 MH (SUTURE)
SUT SILK 1 MH (SUTURE) IMPLANT
SUT SILK 2 0 SH CR/8 (SUTURE) IMPLANT
SUT SILK 3 0 SH CR/8 (SUTURE) ×2 IMPLANT
SUT STEEL 6MS V (SUTURE) ×10 IMPLANT
SUT STEEL SZ 6 DBL 3X14 BALL (SUTURE) ×6 IMPLANT
SUT VIC AB 1 CTX 36 (SUTURE) ×4
SUT VIC AB 1 CTX36XBRD ANBCTR (SUTURE) ×8 IMPLANT
SUT VIC AB 2-0 CT1 27 (SUTURE) ×4
SUT VIC AB 2-0 CT1 TAPERPNT 27 (SUTURE) IMPLANT
SUT VIC AB 2-0 CTX 27 (SUTURE) IMPLANT
SUT VIC AB 3-0 X1 27 (SUTURE) IMPLANT
SYSTEM SAHARA CHEST DRAIN ATS (WOUND CARE) ×6 IMPLANT
TAPE CLOTH SURG 4X10 WHT LF (GAUZE/BANDAGES/DRESSINGS) ×2 IMPLANT
TAPE PAPER 2X10 WHT MICROPORE (GAUZE/BANDAGES/DRESSINGS) ×2 IMPLANT
TOWEL GREEN STERILE (TOWEL DISPOSABLE) ×6 IMPLANT
TOWEL GREEN STERILE FF (TOWEL DISPOSABLE) ×6 IMPLANT
TRAY CATH LUMEN 1 20CM STRL (SET/KITS/TRAYS/PACK) ×2 IMPLANT
TRAY FOLEY SLVR 16FR TEMP STAT (SET/KITS/TRAYS/PACK) ×6 IMPLANT
TUBING ART PRESS 48 MALE/FEM (TUBING) ×4 IMPLANT
TUBING INSUFFLATION (TUBING) ×4 IMPLANT
UNDERPAD 30X30 (UNDERPADS AND DIAPERS) ×6 IMPLANT
WATER STERILE IRR 1000ML POUR (IV SOLUTION) ×12 IMPLANT

## 2018-11-13 NOTE — Progress Notes (Signed)
Dr. Donata Clay called for update on patient progress. Vitals and labs reviewed. Orders received for epi gtt 0-28mcg/min and an additional 2 albumins. Will continue to monitor closely. Modena Jansky RN 2 [facility]

## 2018-11-13 NOTE — Anesthesia Procedure Notes (Signed)
Central Venous Catheter Insertion Performed by: Val Eagle, MD, anesthesiologist Start/End1/01/2019 6:49 AM, 11/13/2018 6:56 AM Patient location: Pre-op. Preanesthetic checklist: patient identified, IV checked, site marked, risks and benefits discussed, surgical consent, monitors and equipment checked, pre-op evaluation, timeout performed and anesthesia consent Hand hygiene performed  and maximum sterile barriers used  PA cath was placed.Swan type:thermodilution Procedure performed without using ultrasound guided technique. Attempts: 1 Patient tolerated the procedure well with no immediate complications.

## 2018-11-13 NOTE — Brief Op Note (Addendum)
11/06/2018 - 11/13/2018  12:11 PM  PATIENT:  Patria Maneonald W Bowmer  73 y.o. male  PRE-OPERATIVE DIAGNOSIS:  1. CAD 2.Moderate MR  POST-OPERATIVE DIAGNOSIS:  1. CAD 2. Mild MR  PROCEDURE:  TRANSESOPHAGEAL ECHOCARDIOGRAM (TEE), MEDIAN STERNOTOMY for CORONARY ARTERY BYPASS GRAFTING (CABG) x 4 (LIMA to LAD, SVG to OM, SVG to RAMUS INTERMEDIATE, and SVG to PDA) with EVH from BILATERAL GREATER SAPHENOUS VEINS and LEFT FEMORAL ARTERIAL LINE  SURGEON:  Surgeon(s) and Role:    Kerin PernaVan Trigt, Peter, MD - Primary  PHYSICIAN ASSISTANT: Doree Fudgeonielle Zimmerman PA-C  ANESTHESIA:   general  EBL:  300 cc  BLOOD ADMINIST.none  DRAINS: Chest tubes placed in the mediastinal and pleural spaces   COUNTS CORRECT:  YES  DICTATION: .Dragon Dictation  PLAN OF CARE: Admit to inpatient   PATIENT DISPOSITION:  ICU - intubated and hemodynamically stable.   Delay start of Pharmacological VTE agent (>24hrs) due to surgical blood loss or risk of bleeding: yes  BASELINE WEIGHT: 85.7 kg

## 2018-11-13 NOTE — Anesthesia Procedure Notes (Signed)
Central Venous Catheter Insertion Performed by: Oleta Mouse, MD, anesthesiologist Start/End1/01/2019 6:49 AM, 11/13/2018 6:56 AM Patient location: Pre-op. Preanesthetic checklist: patient identified, IV checked, site marked, risks and benefits discussed, surgical consent, monitors and equipment checked, pre-op evaluation, timeout performed and anesthesia consent Lidocaine 1% used for infiltration and patient sedated Hand hygiene performed  and maximum sterile barriers used  Catheter size: 9 Fr Total catheter length 10. MAC introducer Procedure performed using ultrasound guided technique. Ultrasound Notes:anatomy identified, needle tip was noted to be adjacent to the nerve/plexus identified, no ultrasound evidence of intravascular and/or intraneural injection and image(s) printed for medical record Attempts: 1 Following insertion, line sutured, dressing applied and Biopatch. Post procedure assessment: blood return through all ports, free fluid flow and no air  Patient tolerated the procedure well with no immediate complications.

## 2018-11-13 NOTE — Transfer of Care (Signed)
Immediate Anesthesia Transfer of Care Note  Patient: David Mack  Procedure(s) Performed: CORONARY ARTERY BYPASS GRAFTING (CABG), ON PUMP, TIMES FOUR, USING LEFT INTERNAL MAMMARY ARTERY, AND BILATERAL GREATER SAPHENOUS VEIN HARVESTED ENDOSCOPICALLY (N/A Chest) TRANSESOPHAGEAL ECHOCARDIOGRAM (TEE) (N/A )  Patient Location: PACU  Anesthesia Type:General  Level of Consciousness: Patient remains intubated per anesthesia plan  Airway & Oxygen Therapy: Patient remains intubated per anesthesia plan and Patient placed on Ventilator (see vital sign flow sheet for setting)  Post-op Assessment: Report given to RN and Post -op Vital signs reviewed and stable  Post vital signs: Reviewed and stable  Last Vitals:  Vitals Value Taken Time  BP    Temp    Pulse    Resp    SpO2      Last Pain:  Vitals:   11/13/18 0517  TempSrc: Oral  PainSc:          Complications: No apparent anesthesia complications

## 2018-11-13 NOTE — Anesthesia Procedure Notes (Signed)
Arterial Line Insertion Start/End1/01/2019 7:45 AM Performed by: Janora Norlander, CRNA, CRNA  Preanesthetic checklist: patient identified, IV checked, risks and benefits discussed, surgical consent and monitors and equipment checked Lidocaine 1% used for infiltration Left, radial was placed Catheter size: 20 G Hand hygiene performed  and maximum sterile barriers used  Allen's test indicative of satisfactory collateral circulation Attempts: 1 Procedure performed without using ultrasound guided technique. Following insertion, dressing applied and Biopatch. Post procedure assessment: normal  Patient tolerated the procedure well with no immediate complications.

## 2018-11-13 NOTE — Anesthesia Procedure Notes (Signed)
Procedure Name: Intubation Date/Time: 11/13/2018 7:48 AM Performed by: Elayne Snare, CRNA Pre-anesthesia Checklist: Patient identified, Emergency Drugs available, Suction available and Patient being monitored Patient Re-evaluated:Patient Re-evaluated prior to induction Oxygen Delivery Method: Circle System Utilized Preoxygenation: Pre-oxygenation with 100% oxygen Induction Type: IV induction Ventilation: Mask ventilation without difficulty Laryngoscope Size: Mac and 4 Grade View: Grade I Tube type: Oral Tube size: 8.0 mm Number of attempts: 1 Airway Equipment and Method: Stylet Placement Confirmation: ETT inserted through vocal cords under direct vision,  positive ETCO2 and breath sounds checked- equal and bilateral Secured at: 24 cm Tube secured with: Tape Dental Injury: Teeth and Oropharynx as per pre-operative assessment

## 2018-11-13 NOTE — Progress Notes (Signed)
CT surgery p.m. Rounds  Patient hemodynamically stable after CABG for ischemic cardiomyopathy, low EF Still sedated on ventilator A paced Cardiac index 2.2 on low-dose milrinone COPD with adequate oxygenation and ventilation on ventilator at this point

## 2018-11-13 NOTE — Progress Notes (Signed)
  Echocardiogram Echocardiogram Transesophageal has been performed.  Gerda Diss 11/13/2018, 8:45 AM

## 2018-11-13 NOTE — Progress Notes (Signed)
Pre Procedure note for inpatients:   David Mack has been scheduled for Procedure(s): CORONARY ARTERY BYPASS GRAFTING (CABG) (N/A) Possible, MITRAL VALVE REPAIR (MVR) (N/A) TRANSESOPHAGEAL ECHOCARDIOGRAM (TEE) (N/A) today. The various methods of treatment have been discussed with the patient. After consideration of the risks, benefits and treatment options the patient has consented to the planned procedure.   The patient has been seen and labs reviewed. There are no changes in the patient's condition to prevent proceeding with the planned procedure today.  Recent labs:  Lab Results  Component Value Date   WBC 6.7 11/13/2018   HGB 11.3 (L) 11/13/2018   HCT 33.1 (L) 11/13/2018   PLT 208 11/13/2018   GLUCOSE 106 (H) 11/13/2018   ALT 15 11/11/2018   AST 21 11/11/2018   NA 128 (L) 11/13/2018   K 3.6 11/13/2018   CL 93 (L) 11/13/2018   CREATININE 0.82 11/13/2018   BUN 12 11/13/2018   CO2 23 11/13/2018   TSH 2.980 11/11/2018   INR 1.06 11/11/2018   HGBA1C 4.9 11/11/2018    Kerin Perna III, MD 11/13/2018 7:09 AM

## 2018-11-13 NOTE — Progress Notes (Signed)
Pt weaned and extubated at 2020 to 4 lpm nasal cannula.  Pt vocalizing post extubation.  Pulmonary mechanics prior to extubation were: NIF -25, FVC 900 ml. Pt tolerated extubation well.

## 2018-11-13 NOTE — Op Note (Signed)
NAME: David Mack, David Mack. MEDICAL RECORD ON:[id number] ACCOUNT [id number] DATE OF BIRTH:December 22, 1945 FACILITY: [facility] LOCATION: [facility] PHYSICIAN:Aldea Avis VAN TRIGT III, MD  OPERATIVE REPORT  DATE OF PROCEDURE:  11/13/2018  PROCEDURE PERFORMED: 1.  Coronary artery bypass grafting x4 (left internal mammary artery to left anterior descending, saphenous vein graft to ramus intermediate, saphenous vein graft to obtuse marginal, saphenous vein graft to posterior descending). 2.  Bilateral endoscopic harvest of left and right greater saphenous veins.  SURGEON:  Kerin Perna, MD  ASSISTANT:  Doree Fudge, PA-C.  ANESTHESIA:  General by Dr. Arrie Aran.  PREOPERATIVE DIAGNOSES:  Ischemic cardiomyopathy, non-ST elevation myocardial infarction, severe 3-vessel coronary artery disease, moderate central mitral regurgitation.  POSTOPERATIVE DIAGNOSES:  Ischemic cardiomyopathy, non-ST elevation myocardial infarction, severe 3-vessel coronary artery disease, moderate central mitral regurgitation.  CLINICAL NOTE:  The patient is a 73 year old smoker, who presented to an outside hospital with symptoms of heart failure and had positive cardiac enzymes.  Cardiac catheterization and echocardiogram were performed.  He was found to have EF of 20% with  severe 3-vessel coronary artery disease.  He was recommended for CABG pending a viability study, but the patient checked out of the hospital and returned home.  He had been diuresed during that brief hospital stay, but after returning home, developed  fluid retention and recurrent symptoms of heart failure, shortness of breath, orthopnea, PND and chest tightness.  He presented to this hospital for further therapy.  He was found to be in class IV heart failure with chronic systolic heart failure.  A  repeat echocardiogram showed EF of 25% with moderate MR.  A viability study was performed at this hospital, which showed some scarring inferiorly, but otherwise  significant viable myocardium.  For that reason, he was recommended for high-risk CABG.  I  saw the patient in consultation and discussed the results of his cardiac cath, his echocardiogram and his viability study.  I discussed the procedure of high-risk CABG and possible mitral valve surgery if needed.  I discussed the procedure in detail  including the use of general anesthesia, cardiopulmonary bypass, and the location of the surgical incisions.  I discussed the expected postoperative recovery as well as the potential risks of surgery including the risk of stroke, bleeding, blood  transfusion, infection, postoperative pulmonary problems including pleural effusion, postoperative organ failure, sepsis, and death.  After reviewing these issues, he demonstrated his understanding and agreed to proceed with surgery under what I felt was  an informed consent.  DESCRIPTION OF PROCEDURE:  The patient was brought from preop holding where informed consent was documented and all final questions addressed.  The patient was placed supine on the operating table and general anesthesia was induced.  He remained stable.   A transesophageal echo probe was placed by the anesthesia team.  I reviewed the images of the echocardiogram with the anesthesiologist for coordination of care.  It was felt the patient had functional central mitral regurgitation, which was moderate.   It was felt that with revascularization and improved LV function at the mitral regurgitation would improve.  The structure of the mitral valve and leaflets of the valve were intact and showed no abnormalities.  The patient was prepped and draped as a sterile field.  A proper time-out was performed.  A sternal incision was made as the vein was harvested from each leg using endoscopic technique.  A left femoral A line was placed for blood pressure monitoring.  After the sternotomy, the left IMA was harvested  as a pedicle graft from its origin at the  subclavian vessels.  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  The sternal retractor was placed and the pericardium opened and suspended.  Pursestrings were placed in the ascending aorta and right atrium.  After the vein was harvested, the patient was heparinized, cannulated and placed on bypass.  The coronaries  were identified for grafting.  The LAD was severely and diffusely diseased and was graftable only distally where it was a 1.5 mm vessel.  The posterior descending was a 1.5 mm vessel with severe proximal disease and occlusion, but this was graftable.   The ramus and OM vessels were graftable vessels also with high-grade proximal lesions.  Cardioplegic cannulas were placed both antegrade and retrograde cold blood cardioplegia, and the patient was cooled to 32 degrees.  The aortic crossclamp was applied,  and a liter of cold blood cardioplegia was delivered in split doses between the antegrade aortic and retrograde coronary sinus catheters.  There was good cardioplegic arrest and supple temperature dropped less than 12 degrees.  Cardioplegia was  delivered every 20 minutes.  The distal coronary anastomoses were performed.  First, distal anastomosis was the posterior descending branch of the RCA.  This was proximally totally occluded.  Reverse saphenous vein was sewn end-to-side with running 7-0 Prolene with good flow through  the graft.  Cardioplegia was redosed.  A second distal anastomosis was to the large second OM of the left circumflex.  There was a proximal 95% stenosis.  Reverse saphenous vein was sewn end-to-side with running 7-0 Prolene with good flow through the graft.  Cardioplegia was redosed.  The third distal anastomosis was the ramus intermediate branch of the left coronary.  This had approximately  80% stenosis.  Reverse saphenous vein was sewn end-to-side with running 7-0 Prolene.  There was good flow through the graft.  Cardioplegia was  redosed.  The fourth distal anastomosis was the distal LAD.  This had a proximal 95% stenosis.  The left IMA pedicle was brought through an opening in the left lateral pericardium and was brought down onto the LAD and sewn end-to-side with running 8-0 Prolene.   There was good flow through the anastomosis after briefly releasing the pedicle bulldog and the mammary artery.  The bulldog was reapplied and the pedicle was secured to the epicardium with 6-0 Prolenes.  Cardioplegia was redosed.  While the cross clamp was still in place, 3 proximal vein anastomoses were performed on the ascending aorta using a 4.5 mm punch and running 6-0 Prolene.  Prior to tying down the final proximal anastomosis, air was vented from the coronaries with a dose  of retrograde warm blood cardioplegia.  The crossclamp was then removed.  The heart was cardioverted back to a regular rhythm.  The vein grafts were deaired and opened.  Hemostasis was achieved at the proximal and distal anastomoses.  The patient was rewarmed and reperfused.  Temporary pacing wires were applied.  The patient  was started on low-dose milrinone.  The lungs were expanded.  Ventilator was resumed.  It should be noted the patient had very hyperinflated lungs, which were inflamed consistent with recent tobacco use.  The patient was weaned off cardiopulmonary bypass without difficulty.  Echo showed significant improvement in global LV function and significant improvement in  the mitral regurgitation, which was now mild.  Protamine was administered without adverse  reaction.  The cannulas were removed.  The mediastinum was irrigated.  The superior pericardial tissue was closed over the aorta.  The anterior mediastinum and left pleural chest tubes were placed and brought out through separate incisions.  The sternum   was closed with a wire.  The patient remained stable.  The pectoralis fascia was closed with a running #1 Vicryl.  Subcutaneous and skin layers were closed with running Vicryl and sterile dressings were applied.  Total cardiopulmonary bypass time was 145  minutes.  The patient returned to the ICU in stable condition.  TN/NUANCE  D:11/13/2018 T:11/13/2018 JOB:[id number]

## 2018-11-14 ENCOUNTER — Inpatient Hospital Stay (HOSPITAL_COMMUNITY): Payer: Medicare Other

## 2018-11-14 LAB — CBC
HCT: 21.3 % — ABNORMAL LOW (ref 39.0–52.0)
HCT: 23.6 % — ABNORMAL LOW (ref 39.0–52.0)
Hemoglobin: 7.2 g/dL — ABNORMAL LOW (ref 13.0–17.0)
Hemoglobin: 8 g/dL — ABNORMAL LOW (ref 13.0–17.0)
MCH: 32.4 pg (ref 26.0–34.0)
MCH: 32.4 pg (ref 26.0–34.0)
MCHC: 33.8 g/dL (ref 30.0–36.0)
MCHC: 33.9 g/dL (ref 30.0–36.0)
MCV: 95.9 fL (ref 80.0–100.0)
MCV: 95.5 fL (ref 80.0–100.0)
PLATELETS: 127 10*3/uL — AB (ref 150–400)
Platelets: 149 10*3/uL — ABNORMAL LOW (ref 150–400)
RBC: 2.22 MIL/uL — ABNORMAL LOW (ref 4.22–5.81)
RBC: 2.47 MIL/uL — ABNORMAL LOW (ref 4.22–5.81)
RDW: 12.1 % (ref 11.5–15.5)
RDW: 13 % (ref 11.5–15.5)
WBC: 11.6 10*3/uL — ABNORMAL HIGH (ref 4.0–10.5)
WBC: 11.7 10*3/uL — ABNORMAL HIGH (ref 4.0–10.5)
nRBC: 0 % (ref 0.0–0.2)
nRBC: 0 % (ref 0.0–0.2)

## 2018-11-14 LAB — BASIC METABOLIC PANEL
ANION GAP: 6 (ref 5–15)
BUN: 7 mg/dL — ABNORMAL LOW (ref 8–23)
CO2: 20 mmol/L — AB (ref 22–32)
Calcium: 8.4 mg/dL — ABNORMAL LOW (ref 8.9–10.3)
Chloride: 104 mmol/L (ref 98–111)
Creatinine, Ser: 0.82 mg/dL (ref 0.61–1.24)
GFR calc Af Amer: >60 mL/min (ref >60)
GFR calc non Af Amer: >60 mL/min (ref >60)
Glucose, Bld: 125 mg/dL — ABNORMAL HIGH (ref 70–99)
Potassium: 4.2 mmol/L (ref 3.5–5.1)
SODIUM: 130 mmol/L — AB (ref 135–145)

## 2018-11-14 LAB — GLUCOSE, CAPILLARY
GLUCOSE-CAPILLARY: 133 mg/dL — AB (ref 70–99)
GLUCOSE-CAPILLARY: 111 mg/dL — AB (ref 70–99)
GLUCOSE-CAPILLARY: 119 mg/dL — AB (ref 70–99)
Glucose-Capillary: 100 mg/dL — ABNORMAL HIGH (ref 70–99)
Glucose-Capillary: 105 mg/dL — ABNORMAL HIGH (ref 70–99)
Glucose-Capillary: 110 mg/dL — ABNORMAL HIGH (ref 70–99)
Glucose-Capillary: 112 mg/dL — ABNORMAL HIGH (ref 70–99)
Glucose-Capillary: 113 mg/dL — ABNORMAL HIGH (ref 70–99)
Glucose-Capillary: 114 mg/dL — ABNORMAL HIGH (ref 70–99)
Glucose-Capillary: 118 mg/dL — ABNORMAL HIGH (ref 70–99)
Glucose-Capillary: 119 mg/dL — ABNORMAL HIGH (ref 70–99)
Glucose-Capillary: 122 mg/dL — ABNORMAL HIGH (ref 70–99)
Glucose-Capillary: 122 mg/dL — ABNORMAL HIGH (ref 70–99)
Glucose-Capillary: 122 mg/dL — ABNORMAL HIGH (ref 70–99)
Glucose-Capillary: 123 mg/dL — ABNORMAL HIGH (ref 70–99)
Glucose-Capillary: 125 mg/dL — ABNORMAL HIGH (ref 70–99)
Glucose-Capillary: 130 mg/dL — ABNORMAL HIGH (ref 70–99)
Glucose-Capillary: 132 mg/dL — ABNORMAL HIGH (ref 70–99)
Glucose-Capillary: 133 mg/dL — ABNORMAL HIGH (ref 70–99)
Glucose-Capillary: 134 mg/dL — ABNORMAL HIGH (ref 70–99)
Glucose-Capillary: 145 mg/dL — ABNORMAL HIGH (ref 70–99)
Glucose-Capillary: 156 mg/dL — ABNORMAL HIGH (ref 70–99)
Glucose-Capillary: 54 mg/dL — ABNORMAL LOW (ref 70–99)
Glucose-Capillary: 92 mg/dL (ref 70–99)

## 2018-11-14 LAB — PREPARE RBC (CROSSMATCH)

## 2018-11-14 LAB — POCT I-STAT 3, ART BLOOD GAS (G3+)
ACID-BASE DEFICIT: 4 mmol/L — AB (ref 0.0–2.0)
Bicarbonate: 20.4 mmol/L (ref 20.0–28.0)
O2 Saturation: 97 %
Patient temperature: 37.2
TCO2: 21 mmol/L — ABNORMAL LOW (ref 22–32)
pCO2 arterial: 32 mmHg (ref 32.0–48.0)
pH, Arterial: 7.414 (ref 7.350–7.450)
pO2, Arterial: 91 mmHg (ref 83.0–108.0)

## 2018-11-14 LAB — CREATININE, SERUM
Creatinine, Ser: 0.8 mg/dL (ref 0.61–1.24)
GFR calc Af Amer: >60 mL/min (ref >60)
GFR calc non Af Amer: >60 mL/min (ref >60)

## 2018-11-14 LAB — MAGNESIUM
MAGNESIUM: 2.3 mg/dL (ref 1.7–2.4)
Magnesium: 2.1 mg/dL (ref 1.7–2.4)

## 2018-11-14 MED ORDER — AMIODARONE HCL 200 MG PO TABS
200.0000 mg | ORAL_TABLET | Freq: Two times a day (BID) | ORAL | Status: DC
  Administered 2018-11-14 – 2018-11-15 (×4): 200 mg via ORAL
  Filled 2018-11-14 (×4): qty 1

## 2018-11-14 MED ORDER — FUROSEMIDE 10 MG/ML IJ SOLN
40.0000 mg | Freq: Two times a day (BID) | INTRAMUSCULAR | Status: DC
  Administered 2018-11-14 – 2018-11-15 (×3): 40 mg via INTRAVENOUS
  Filled 2018-11-14 (×5): qty 4

## 2018-11-14 MED ORDER — INSULIN DETEMIR 100 UNIT/ML ~~LOC~~ SOLN
8.0000 [IU] | Freq: Every day | SUBCUTANEOUS | Status: DC
  Administered 2018-11-14: 8 [IU] via SUBCUTANEOUS
  Filled 2018-11-14 (×2): qty 0.08

## 2018-11-14 MED ORDER — MILRINONE LACTATE IN DEXTROSE 20-5 MG/100ML-% IV SOLN
0.1250 ug/kg/min | INTRAVENOUS | Status: DC
  Administered 2018-11-14: 0.125 ug/kg/min via INTRAVENOUS
  Filled 2018-11-14: qty 100

## 2018-11-14 MED ORDER — VANCOMYCIN HCL 10 G IV SOLR
1500.0000 mg | Freq: Once | INTRAVENOUS | Status: AC
  Administered 2018-11-14: 1500 mg via INTRAVENOUS
  Filled 2018-11-14: qty 1000

## 2018-11-14 MED ORDER — INSULIN ASPART 100 UNIT/ML ~~LOC~~ SOLN
0.0000 [IU] | SUBCUTANEOUS | Status: DC
  Administered 2018-11-14: 2 [IU] via SUBCUTANEOUS

## 2018-11-14 MED ORDER — FUROSEMIDE 10 MG/ML IJ SOLN: 20.0000 mg | Freq: Two times a day (BID) | INTRAMUSCULAR | Status: DC

## 2018-11-14 MED ORDER — MIDAZOLAM HCL 2 MG/2ML IJ SOLN: 1.0000 mg | INTRAMUSCULAR | Status: DC | PRN

## 2018-11-14 NOTE — Progress Notes (Signed)
1 Day Post-Op Procedure(s) (LRB): CORONARY ARTERY BYPASS GRAFTING (CABG), ON PUMP, TIMES FOUR, USING LEFT INTERNAL MAMMARY ARTERY, AND BILATERAL GREATER SAPHENOUS VEIN HARVESTED ENDOSCOPICALLY (N/A) TRANSESOPHAGEAL ECHOCARDIOGRAM (TEE) (N/A) Subjective: Extubated Vasodilated with hi cardiac output, low Hb increased dose neo NSR Objective: Vital signs in last 24 hours: Temp:  [96.1 F (35.6 C)-99.5 F (37.5 C)] 99 F (37.2 C) (01/04 0900) Pulse Rate:  [87-101] 87 (01/04 0900) Cardiac Rhythm: Atrial paced (01/04 0800) Resp:  [12-32] 21 (01/04 0900) BP: (90-118)/(42-77) 118/42 (01/04 0900) SpO2:  [94 %-100 %] 96 % (01/04 0900) Arterial Line BP: (93-153)/(36-64) 93/36 (01/04 0900) FiO2 (%):  [40 %-50 %] 40 % (01/03 1945) Weight:  [89.8 kg] 89.8 kg (01/04 0428)  Hemodynamic parameters for last 24 hours: PAP: (23-47)/(3-23) 27/9 CO:  [3.7 L/min-7.5 L/min] 7.5 L/min CI:  [1.9 L/min/m2-3.9 L/min/m2] 3.9 L/min/m2  Intake/Output from previous day: 01/03 0701 - 01/04 0700 In: 7446.5 [I.V.:4502; Blood:515; NG/GT:30; IV Piggyback:2399.5] Out: 3660 [Urine:2050; Blood:900; Chest Tube:710] Intake/Output this shift: Total I/O In: 302.5 [I.V.:302.5] Out: 65 [Urine:35; Chest Tube:30]       Exam    General- alert and comfortable    Neck- no JVD, no cervical adenopathy palpable, no carotid bruit   Lungs- clear without rales, wheezes   Cor- regular rate and rhythm, no murmur , gallop   Abdomen- soft, non-tender   Extremities - warm, non-tender, minimal edema   Neuro- oriented, appropriate, no focal weakness   Lab Results: Recent Labs    11/13/18 2003 11/13/18 2004 11/14/18 0257  WBC 13.2*  --  11.6*  HGB 8.5* 8.5* 7.2*  HCT 25.2* 25.0* 21.3*  PLT 155  --  149*   BMET:  Recent Labs    11/13/18 0358  11/13/18 2004 11/14/18 0257  NA 128*   < > 131* 130*  K 3.6   < > 4.7 4.2  CL 93*   < > 99 104  CO2 23  --   --  20*  GLUCOSE 106*   < > 134* 125*  BUN 12   < > 8 7*   CREATININE 0.82   < > 0.60* 0.82  CALCIUM 8.9  --   --  8.4*   < > = values in this interval not displayed.    PT/INR:  Recent Labs    11/13/18 1437  LABPROT 17.6*  INR 1.46   ABG    Component Value Date/Time   PHART 7.414 11/14/2018 0645   HCO3 20.4 11/14/2018 0645   TCO2 21 (L) 11/14/2018 0645   ACIDBASEDEF 4.0 (H) 11/14/2018 0645   O2SAT 97.0 11/14/2018 0645   CBG (last 3)  Recent Labs    11/14/18 0726 11/14/18 0821 11/14/18 0824  GLUCAP 114* 54* 110*    Assessment/Plan: S/P Procedure(s) (LRB): CORONARY ARTERY BYPASS GRAFTING (CABG), ON PUMP, TIMES FOUR, USING LEFT INTERNAL MAMMARY ARTERY, AND BILATERAL GREATER SAPHENOUS VEIN HARVESTED ENDOSCOPICALLY (N/A) TRANSESOPHAGEAL ECHOCARDIOGRAM (TEE) (N/A) Mobilize Diuresis Diabetes control 1 unit PRBC for expected blood loss anemia  LOS: 8 days    Kathlee Nations Trigt III 11/14/2018

## 2018-11-14 NOTE — Plan of Care (Signed)
  Problem: Education: Goal: Knowledge of General Education information will improve Description Including pain rating scale, medication(s)/side effects and non-pharmacologic comfort measures Outcome: Progressing Note:  Education ongoing   Problem: Health Behavior/Discharge Planning: Goal: Ability to manage health-related needs will improve Outcome: Progressing   Problem: Clinical Measurements: Goal: Ability to maintain clinical measurements within normal limits will improve Outcome: Progressing Goal: Will remain free from infection Outcome: Progressing Goal: Diagnostic test results will improve Outcome: Progressing Goal: Respiratory complications will improve Outcome: Progressing Goal: Cardiovascular complication will be avoided Outcome: Progressing   Problem: Activity: Goal: Risk for activity intolerance will decrease Outcome: Progressing   Problem: Coping: Goal: Level of anxiety will decrease Outcome: Progressing   Problem: Safety: Goal: Ability to remain free from injury will improve Outcome: Progressing   Problem: Skin Integrity: Goal: Risk for impaired skin integrity will decrease Outcome: Progressing   Problem: Education: Goal: Will demonstrate proper wound care and an understanding of methods to prevent future damage Outcome: Progressing Goal: Knowledge of disease or condition will improve Outcome: Progressing Goal: Knowledge of the prescribed therapeutic regimen will improve Outcome: Progressing Goal: Individualized Educational Video(s) Outcome: Progressing   Problem: Activity: Goal: Risk for activity intolerance will decrease Outcome: Progressing   Problem: Cardiac: Goal: Will achieve and/or maintain hemodynamic stability Outcome: Progressing Note:  Within parameters on current gtt's   Problem: Clinical Measurements: Goal: Postoperative complications will be avoided or minimized Outcome: Progressing   Problem: Respiratory: Goal: Respiratory  status will improve Outcome: Progressing   Problem: Skin Integrity: Goal: Wound healing without signs and symptoms of infection Outcome: Progressing Goal: Risk for impaired skin integrity will decrease Outcome: Progressing   Problem: Urinary Elimination: Goal: Ability to achieve and maintain adequate renal perfusion and functioning will improve Outcome: Progressing

## 2018-11-14 NOTE — Progress Notes (Signed)
CT surgery p.m. Rounds  Patient had stable day, maintained sinus rhythm and neo-drip has been weaned off Breathing comfortably on room air 1 unit of packed cells being infused for hemoglobin 7.4 Patient was up out of bed to the chair

## 2018-11-15 ENCOUNTER — Inpatient Hospital Stay (HOSPITAL_COMMUNITY): Payer: Medicare Other

## 2018-11-15 LAB — COOXEMETRY PANEL
Carboxyhemoglobin: 2.3 % — ABNORMAL HIGH (ref 0.5–1.5)
Methemoglobin: 1.3 % (ref 0.0–1.5)
O2 Saturation: 66.6 %
Total hemoglobin: 7.7 g/dL — ABNORMAL LOW (ref 12.0–16.0)

## 2018-11-15 LAB — GLUCOSE, CAPILLARY
Glucose-Capillary: 100 mg/dL — ABNORMAL HIGH (ref 70–99)
Glucose-Capillary: 101 mg/dL — ABNORMAL HIGH (ref 70–99)
Glucose-Capillary: 105 mg/dL — ABNORMAL HIGH (ref 70–99)
Glucose-Capillary: 120 mg/dL — ABNORMAL HIGH (ref 70–99)
Glucose-Capillary: 122 mg/dL — ABNORMAL HIGH (ref 70–99)
Glucose-Capillary: 125 mg/dL — ABNORMAL HIGH (ref 70–99)
Glucose-Capillary: 86 mg/dL (ref 70–99)

## 2018-11-15 LAB — BASIC METABOLIC PANEL
Anion gap: 7 (ref 5–15)
BUN: 11 mg/dL (ref 8–23)
CO2: 21 mmol/L — ABNORMAL LOW (ref 22–32)
Calcium: 8.8 mg/dL — ABNORMAL LOW (ref 8.9–10.3)
Chloride: 102 mmol/L (ref 98–111)
Creatinine, Ser: 0.87 mg/dL (ref 0.61–1.24)
GFR calc Af Amer: >60 mL/min (ref >60)
GFR calc non Af Amer: >60 mL/min (ref >60)
Glucose, Bld: 106 mg/dL — ABNORMAL HIGH (ref 70–99)
Potassium: 3.8 mmol/L (ref 3.5–5.1)
Sodium: 130 mmol/L — ABNORMAL LOW (ref 135–145)

## 2018-11-15 LAB — CBC
HCT: 23 % — ABNORMAL LOW (ref 39.0–52.0)
Hemoglobin: 7.8 g/dL — ABNORMAL LOW (ref 13.0–17.0)
MCH: 32.1 pg (ref 26.0–34.0)
MCHC: 33.9 g/dL (ref 30.0–36.0)
MCV: 94.7 fL (ref 80.0–100.0)
Platelets: 137 10*3/uL — ABNORMAL LOW (ref 150–400)
RBC: 2.43 MIL/uL — ABNORMAL LOW (ref 4.22–5.81)
RDW: 13.2 % (ref 11.5–15.5)
WBC: 11.3 10*3/uL — ABNORMAL HIGH (ref 4.0–10.5)
nRBC: 0 % (ref 0.0–0.2)

## 2018-11-15 MED ORDER — INSULIN ASPART 100 UNIT/ML ~~LOC~~ SOLN
0.0000 [IU] | Freq: Three times a day (TID) | SUBCUTANEOUS | Status: DC
  Administered 2018-11-15 (×2): 2 [IU] via SUBCUTANEOUS

## 2018-11-15 MED ORDER — CARVEDILOL 12.5 MG PO TABS
12.5000 mg | ORAL_TABLET | Freq: Two times a day (BID) | ORAL | Status: DC
  Administered 2018-11-15: 12.5 mg via ORAL
  Filled 2018-11-15: qty 1

## 2018-11-15 MED ORDER — POTASSIUM CHLORIDE 10 MEQ/50ML IV SOLN
10.0000 meq | INTRAVENOUS | Status: AC
  Administered 2018-11-15 (×2): 10 meq via INTRAVENOUS
  Filled 2018-11-15 (×2): qty 50

## 2018-11-15 MED ORDER — CARVEDILOL 6.25 MG PO TABS
6.2500 mg | ORAL_TABLET | Freq: Two times a day (BID) | ORAL | Status: DC
  Administered 2018-11-15 – 2018-11-19 (×8): 6.25 mg via ORAL
  Filled 2018-11-15 (×8): qty 1

## 2018-11-15 MED ORDER — POTASSIUM CHLORIDE CRYS ER 20 MEQ PO TBCR
20.0000 meq | EXTENDED_RELEASE_TABLET | Freq: Two times a day (BID) | ORAL | Status: DC
  Administered 2018-11-15 – 2018-11-19 (×9): 20 meq via ORAL
  Filled 2018-11-15 (×9): qty 1

## 2018-11-15 MED ORDER — FE FUMARATE-B12-VIT C-FA-IFC PO CAPS
1.0000 | ORAL_CAPSULE | Freq: Two times a day (BID) | ORAL | Status: DC
  Administered 2018-11-15 – 2018-11-19 (×9): 1 via ORAL
  Filled 2018-11-15 (×9): qty 1

## 2018-11-15 MED ORDER — ENOXAPARIN SODIUM 40 MG/0.4ML ~~LOC~~ SOLN
40.0000 mg | SUBCUTANEOUS | Status: DC
  Administered 2018-11-15: 40 mg via SUBCUTANEOUS
  Filled 2018-11-15: qty 0.4

## 2018-11-15 NOTE — Progress Notes (Signed)
CT surgery p.m. Rounds  Patient remains in sinus rhythm Ambulated in hallway Bilateral rhonchi on exam-continue Mucinex, flutter valve, Dulera MDI Chest tube drainage tapering off, will remove left pleural drain tomorrow

## 2018-11-15 NOTE — Progress Notes (Signed)
2 Days Post-Op Procedure(s) (LRB): CORONARY ARTERY BYPASS GRAFTING (CABG), ON PUMP, TIMES FOUR, USING LEFT INTERNAL MAMMARY ARTERY, AND BILATERAL GREATER SAPHENOUS VEIN HARVESTED ENDOSCOPICALLY (N/A) TRANSESOPHAGEAL ECHOCARDIOGRAM (TEE) (N/A) Subjective: nsr Walked in hall  Chest tubes 500ccout Co-ox 65% Objective: Vital signs in last 24 hours: Temp:  [97.9 F (36.6 C)-99 F (37.2 C)] 98.4 F (36.9 C) (01/05 0735) Pulse Rate:  [64-105] 90 (01/05 0700) Cardiac Rhythm: Normal sinus rhythm (01/05 0801) Resp:  [20-29] 29 (01/05 0700) BP: (95-133)/(42-77) 130/70 (01/05 0700) SpO2:  [94 %-100 %] 100 % (01/05 0835) Arterial Line BP: (93-137)/(36-54) 129/47 (01/04 1600) Weight:  [89.4 kg] 89.4 kg (01/05 0500)  Hemodynamic parameters for last 24 hours: PAP: (27)/(9) 27/9  Intake/Output from previous day: 01/04 0701 - 01/05 0700 In: 2154.5 [P.O.:360; I.V.:1029; Blood:315; IV Piggyback:450.5] Out: 1915 [Urine:1395; Chest Tube:520] Intake/Output this shift: Total I/O In: 12.8 [I.V.:12.8] Out: 40 [Urine:40]       Exam    General- alert and comfortable    Neck- no JVD, no cervical adenopathy palpable, no carotid bruit   Lungs- clear without rales, wheezes   Cor- regular rate and rhythm, no murmur , gallop   Abdomen- soft, non-tender   Extremities - warm, non-tender, minimal edema   Neuro- oriented, appropriate, no focal weakness   Lab Results: Recent Labs    11/14/18 1938 11/15/18 0750  WBC 11.7* 11.3*  HGB 8.0* 7.8*  HCT 23.6* 23.0*  PLT 127* 137*   BMET:  Recent Labs    11/14/18 0257 11/14/18 1938 11/15/18 0448  NA 130*  --  130*  K 4.2  --  3.8  CL 104  --  102  CO2 20*  --  21*  GLUCOSE 125*  --  106*  BUN 7*  --  11  CREATININE 0.82 0.80 0.87  CALCIUM 8.4*  --  8.8*    PT/INR:  Recent Labs    11/13/18 1437  LABPROT 17.6*  INR 1.46   ABG    Component Value Date/Time   PHART 7.414 11/14/2018 0645   HCO3 20.4 11/14/2018 0645   TCO2 21 (L)  11/14/2018 0645   ACIDBASEDEF 4.0 (H) 11/14/2018 0645   O2SAT 66.6 11/15/2018 0500   CBG (last 3)  Recent Labs    11/14/18 1944 11/14/18 2343 11/15/18 0330  GLUCAP 122* 86 101*    Assessment/Plan: S/P Procedure(s) (LRB): CORONARY ARTERY BYPASS GRAFTING (CABG), ON PUMP, TIMES FOUR, USING LEFT INTERNAL MAMMARY ARTERY, AND BILATERAL GREATER SAPHENOUS VEIN HARVESTED ENDOSCOPICALLY (N/A) TRANSESOPHAGEAL ECHOCARDIOGRAM (TEE) (N/A) Mobilize Diuresis d/c tubes/lines oral iron for expected postiop blood loss anemia   LOS: 9 days    David Mack 11/15/2018

## 2018-11-15 NOTE — Plan of Care (Signed)
  Problem: Activity: Goal: Risk for activity intolerance will decrease Outcome: Progressing   Problem: Cardiac: Goal: Will achieve and/or maintain hemodynamic stability Outcome: Progressing   Problem: Clinical Measurements: Goal: Postoperative complications will be avoided or minimized Outcome: Progressing   Problem: Respiratory: Goal: Respiratory status will improve Outcome: Progressing   Problem: Skin Integrity: Goal: Risk for impaired skin integrity will decrease Outcome: Progressing   Problem: Urinary Elimination: Goal: Ability to achieve and maintain adequate renal perfusion and functioning will improve Outcome: Progressing   

## 2018-11-16 ENCOUNTER — Encounter (HOSPITAL_COMMUNITY): Payer: Self-pay | Admitting: Cardiothoracic Surgery

## 2018-11-16 ENCOUNTER — Inpatient Hospital Stay (HOSPITAL_COMMUNITY): Payer: Medicare Other

## 2018-11-16 DIAGNOSIS — Z951 Presence of aortocoronary bypass graft: Secondary | ICD-10-CM

## 2018-11-16 LAB — TYPE AND SCREEN
ABO/RH(D): A POS
Antibody Screen: NEGATIVE
Unit division: 0
Unit division: 0
Unit division: 0
Unit division: 0

## 2018-11-16 LAB — CBC
HCT: 21.8 % — ABNORMAL LOW (ref 39.0–52.0)
Hemoglobin: 7.6 g/dL — ABNORMAL LOW (ref 13.0–17.0)
MCH: 32.8 pg (ref 26.0–34.0)
MCHC: 34.9 g/dL (ref 30.0–36.0)
MCV: 94 fL (ref 80.0–100.0)
Platelets: 134 10*3/uL — ABNORMAL LOW (ref 150–400)
RBC: 2.32 MIL/uL — ABNORMAL LOW (ref 4.22–5.81)
RDW: 12.8 % (ref 11.5–15.5)
WBC: 13 10*3/uL — ABNORMAL HIGH (ref 4.0–10.5)
nRBC: 0 % (ref 0.0–0.2)

## 2018-11-16 LAB — BPAM RBC
Blood Product Expiration Date: 202001302359
Blood Product Expiration Date: 202001302359
Blood Product Expiration Date: 202001302359
Blood Product Expiration Date: 202001302359
ISSUE DATE / TIME: 202001030859
ISSUE DATE / TIME: 202001030859
ISSUE DATE / TIME: 202001041529
Unit Type and Rh: 6200
Unit Type and Rh: 6200
Unit Type and Rh: 6200
Unit Type and Rh: 6200

## 2018-11-16 LAB — COMPREHENSIVE METABOLIC PANEL
ALT: 15 U/L (ref 0–44)
AST: 24 U/L (ref 15–41)
Albumin: 3.3 g/dL — ABNORMAL LOW (ref 3.5–5.0)
Alkaline Phosphatase: 39 U/L (ref 38–126)
Anion gap: 10 (ref 5–15)
BUN: 18 mg/dL (ref 8–23)
CO2: 20 mmol/L — ABNORMAL LOW (ref 22–32)
Calcium: 9 mg/dL (ref 8.9–10.3)
Chloride: 102 mmol/L (ref 98–111)
Creatinine, Ser: 0.98 mg/dL (ref 0.61–1.24)
GFR calc Af Amer: >60 mL/min (ref >60)
GFR calc non Af Amer: >60 mL/min (ref >60)
Glucose, Bld: 94 mg/dL (ref 70–99)
Potassium: 4 mmol/L (ref 3.5–5.1)
Sodium: 132 mmol/L — ABNORMAL LOW (ref 135–145)
Total Bilirubin: 1 mg/dL (ref 0.3–1.2)
Total Protein: 5.9 g/dL — ABNORMAL LOW (ref 6.5–8.1)

## 2018-11-16 LAB — POCT I-STAT 4, (NA,K, GLUC, HGB,HCT)
GLUCOSE: 91 mg/dL (ref 70–99)
HEMATOCRIT: 24 % — AB (ref 39.0–52.0)
Hemoglobin: 8.2 g/dL — ABNORMAL LOW (ref 13.0–17.0)
Potassium: 3.7 mmol/L (ref 3.5–5.1)
Sodium: 132 mmol/L — ABNORMAL LOW (ref 135–145)

## 2018-11-16 LAB — GLUCOSE, CAPILLARY
Glucose-Capillary: 129 mg/dL — ABNORMAL HIGH (ref 70–99)
Glucose-Capillary: 94 mg/dL (ref 70–99)
Glucose-Capillary: 111 mg/dL — ABNORMAL HIGH (ref 70–99)

## 2018-11-16 LAB — PREPARE RBC (CROSSMATCH)

## 2018-11-16 MED ORDER — ENSURE ENLIVE PO LIQD
237.0000 mL | Freq: Three times a day (TID) | ORAL | Status: DC
  Administered 2018-11-16: 237 mL via ORAL

## 2018-11-16 MED ORDER — SORBITOL 70 % PO SOLN
30.0000 mL | Freq: Every morning | ORAL | Status: AC
  Filled 2018-11-16: qty 30

## 2018-11-16 MED ORDER — FUROSEMIDE 10 MG/ML IJ SOLN
40.0000 mg | Freq: Every day | INTRAMUSCULAR | Status: AC
  Administered 2018-11-16: 40 mg via INTRAVENOUS
  Filled 2018-11-16: qty 4

## 2018-11-16 MED ORDER — FUROSEMIDE 10 MG/ML IJ SOLN: 40.0000 mg | Freq: Every day | INTRAMUSCULAR | Status: DC

## 2018-11-16 MED ORDER — FUROSEMIDE 40 MG PO TABS
40.0000 mg | ORAL_TABLET | Freq: Every day | ORAL | Status: DC
  Administered 2018-11-17 – 2018-11-19 (×3): 40 mg via ORAL
  Filled 2018-11-16 (×3): qty 1

## 2018-11-16 MED ORDER — ENOXAPARIN SODIUM 30 MG/0.3ML ~~LOC~~ SOLN
30.0000 mg | SUBCUTANEOUS | Status: DC
  Administered 2018-11-16 – 2018-11-18 (×3): 30 mg via SUBCUTANEOUS
  Filled 2018-11-16 (×3): qty 0.3

## 2018-11-16 MED ORDER — POTASSIUM CHLORIDE 10 MEQ/50ML IV SOLN: 10.0000 meq | INTRAVENOUS | Status: DC

## 2018-11-16 NOTE — Progress Notes (Signed)
3 Days Post-Op Procedure(s) (LRB): CORONARY ARTERY BYPASS GRAFTING (CABG), ON PUMP, TIMES FOUR, USING LEFT INTERNAL MAMMARY ARTERY, AND BILATERAL GREATER SAPHENOUS VEIN HARVESTED ENDOSCOPICALLY (N/A) TRANSESOPHAGEAL ECHOCARDIOGRAM (TEE) (N/A) Subjective: Walked 200 ft on room air Hb down to 7.5 with COPDD - 1 U PRBC ordered Wt down to baseline Objective: Vital signs in last 24 hours: Temp:  [97.5 F (36.4 C)-98.1 F (36.7 C)] 97.5 F (36.4 C) (01/06 0700) Pulse Rate:  [56-92] 56 (01/06 0700) Cardiac Rhythm: Normal sinus rhythm;Sinus bradycardia (01/06 0000) Resp:  [17-32] 17 (01/06 0700) BP: (94-136)/(48-76) 126/61 (01/06 0700) SpO2:  [94 %-100 %] 99 % (01/06 0700) Weight:  [87.9 kg] 87.9 kg (01/06 0500)  Hemodynamic parameters for last 24 hours:    Intake/Output from previous day: 01/05 0701 - 01/06 0700 In: 542.1 [P.O.:420; I.V.:16.7; IV Piggyback:105.3] Out: 1725 [Urine:1365; Chest Tube:360] Intake/Output this shift: No intake/output data recorded.       Exam    General- alert and comfortable    Neck- no JVD, no cervical adenopathy palpable, no carotid bruit   Sternal dressing dry   Lungs- clear without rales, wheezes   Cor- regular rate and rhythm, no murmur , gallop   Abdomen- soft, non-tender   Extremities - warm, non-tender, minimal edema   Neuro- oriented, appropriate, no focal weakness   Lab Results: Recent Labs    11/15/18 0750 11/16/18 0236  WBC 11.3* 13.0*  HGB 7.8* 7.6*  HCT 23.0* 21.8*  PLT 137* 134*   BMET:  Recent Labs    11/15/18 0448 11/16/18 0236  NA 130* 132*  K 3.8 4.0  CL 102 102  CO2 21* PENDING  GLUCOSE 106* 94  BUN 11 18  CREATININE 0.87 0.98  CALCIUM 8.8* 9.0    PT/INR:  Recent Labs    11/13/18 1437  LABPROT 17.6*  INR 1.46   ABG    Component Value Date/Time   PHART 7.414 11/14/2018 0645   HCO3 20.4 11/14/2018 0645   TCO2 21 (L) 11/14/2018 0645   ACIDBASEDEF 4.0 (H) 11/14/2018 0645   O2SAT 66.6 11/15/2018 0500    CBG (last 3)  Recent Labs    11/15/18 1602 11/15/18 2252 11/16/18 0617  GLUCAP 100* 129* 94    Assessment/Plan: S/P Procedure(s) (LRB): CORONARY ARTERY BYPASS GRAFTING (CABG), ON PUMP, TIMES FOUR, USING LEFT INTERNAL MAMMARY ARTERY, AND BILATERAL GREATER SAPHENOUS VEIN HARVESTED ENDOSCOPICALLY (N/A) TRANSESOPHAGEAL ECHOCARDIOGRAM (TEE) (N/A) Mobilize Diuresis d/c tubes/lines Plan for transfer to step-down: see transfer orders plan DC EPWs tomorrow, home later this week   LOS: 10 days    Kathlee Nations Trigt III 11/16/2018

## 2018-11-16 NOTE — Plan of Care (Signed)
  Problem: Activity: Goal: Risk for activity intolerance will decrease Outcome: Progressing   Problem: Cardiac: Goal: Will achieve and/or maintain hemodynamic stability Outcome: Progressing   Problem: Respiratory: Goal: Respiratory status will improve Outcome: Progressing   Problem: Skin Integrity: Goal: Risk for impaired skin integrity will decrease Outcome: Progressing   Problem: Urinary Elimination: Goal: Ability to achieve and maintain adequate renal perfusion and functioning will improve Outcome: Progressing   

## 2018-11-16 NOTE — Anesthesia Postprocedure Evaluation (Signed)
Anesthesia Post Note  Patient: David Mack  Procedure(s) Performed: CORONARY ARTERY BYPASS GRAFTING (CABG), ON PUMP, TIMES FOUR, USING LEFT INTERNAL MAMMARY ARTERY, AND BILATERAL GREATER SAPHENOUS VEIN HARVESTED ENDOSCOPICALLY (N/A Chest) TRANSESOPHAGEAL ECHOCARDIOGRAM (TEE) (N/A )     Patient location during evaluation: SICU Anesthesia Type: General Level of consciousness: sedated Pain management: pain level controlled Vital Signs Assessment: post-procedure vital signs reviewed and stable Respiratory status: patient remains intubated per anesthesia plan Cardiovascular status: stable Postop Assessment: no apparent nausea or vomiting Anesthetic complications: no    Last Vitals:  Vitals:   11/16/18 0400 11/16/18 0500  BP: (!) 118/52 124/70  Pulse: 67 69  Resp: (!) 24 19  Temp:    SpO2: 98% 100%    Last Pain:  Vitals:   11/16/18 0337  TempSrc: Oral  PainSc:                  Cecile Hearing

## 2018-11-16 NOTE — Progress Notes (Signed)
Pt received from 2H via wheelchair. Pt has pacer wires taped. Pt ambulates well. Vitals stable. CCMD notified Telebox applied. Will continue to monitor. Lacy Duverney, RN

## 2018-11-16 NOTE — Progress Notes (Signed)
Patient ID: David Mack, male   DOB: 10-09-1946, 73 y.o.   MRN: [id number] EVENING ROUNDS NOTE :     [address]       [place] [id number]             [phone]                 3 Days Post-Op Procedure(s) (LRB): CORONARY ARTERY BYPASS GRAFTING (CABG), ON PUMP, TIMES FOUR, USING LEFT INTERNAL MAMMARY ARTERY, AND BILATERAL GREATER SAPHENOUS VEIN HARVESTED ENDOSCOPICALLY (N/A) TRANSESOPHAGEAL ECHOCARDIOGRAM (TEE) (N/A)  Total Length of Stay:  LOS: 10 days  BP 117/69   Pulse (!) 53   Temp (!) 97.3 F (36.3 C) (Oral)   Resp 14   Ht 5\' 6"  (1.676 m)   Wt 87.9 kg   SpO2 98%   BMI 31.28 kg/m   .Intake/Output      01/05 0701 - 01/06 0700 01/06 0701 - 01/07 0700   P.O. 420 515   I.V. (mL/kg) 16.7 (0.2)    Blood  315   IV Piggyback 105.3    Total Intake(mL/kg) 542.1 (6.2) 830 (9.4)   Urine (mL/kg/hr) 1365 (0.6) 500 (0.6)   Chest Tube 360    Total Output 1725 500   Net -1182.9 +330          . sodium chloride    . sodium chloride 10 mL/hr at 11/13/18 1800     Lab Results  Component Value Date   WBC 13.0 (H) 11/16/2018   HGB 7.6 (L) 11/16/2018   HCT 21.8 (L) 11/16/2018   PLT 134 (L) 11/16/2018   GLUCOSE 94 11/16/2018   ALT 15 11/16/2018   AST 24 11/16/2018   NA 132 (L) 11/16/2018   K 4.0 11/16/2018   CL 102 11/16/2018   CREATININE 0.98 11/16/2018   BUN 18 11/16/2018   CO2 20 (L) 11/16/2018   TSH 2.980 11/11/2018   INR 1.46 11/13/2018   HGBA1C 4.9 11/11/2018   Stable , to get foley and chest tube out    Delight Ovens MD  Beeper [phone] Office [phone] 11/16/2018 4:20 PM

## 2018-11-17 ENCOUNTER — Inpatient Hospital Stay (HOSPITAL_COMMUNITY): Payer: Medicare Other

## 2018-11-17 LAB — CBC
HCT: 26.3 % — ABNORMAL LOW (ref 39.0–52.0)
Hemoglobin: 9 g/dL — ABNORMAL LOW (ref 13.0–17.0)
MCH: 32.3 pg (ref 26.0–34.0)
MCHC: 34.2 g/dL (ref 30.0–36.0)
MCV: 94.3 fL (ref 80.0–100.0)
Platelets: 179 10*3/uL (ref 150–400)
RBC: 2.79 MIL/uL — ABNORMAL LOW (ref 4.22–5.81)
RDW: 13.4 % (ref 11.5–15.5)
WBC: 10.6 10*3/uL — ABNORMAL HIGH (ref 4.0–10.5)
nRBC: 0 % (ref 0.0–0.2)

## 2018-11-17 LAB — TYPE AND SCREEN
ABO/RH(D): A POS
Antibody Screen: NEGATIVE
Unit division: 0

## 2018-11-17 LAB — BASIC METABOLIC PANEL
Anion gap: 10 (ref 5–15)
BUN: 20 mg/dL (ref 8–23)
CO2: 21 mmol/L — ABNORMAL LOW (ref 22–32)
Calcium: 9.1 mg/dL (ref 8.9–10.3)
Chloride: 102 mmol/L (ref 98–111)
Creatinine, Ser: 0.87 mg/dL (ref 0.61–1.24)
GFR calc Af Amer: >60 mL/min (ref >60)
GFR calc non Af Amer: >60 mL/min (ref >60)
Glucose, Bld: 112 mg/dL — ABNORMAL HIGH (ref 70–99)
Potassium: 4.1 mmol/L (ref 3.5–5.1)
Sodium: 133 mmol/L — ABNORMAL LOW (ref 135–145)

## 2018-11-17 LAB — BPAM RBC
Blood Product Expiration Date: 202001282359
ISSUE DATE / TIME: 202001061001
Unit Type and Rh: 6200

## 2018-11-17 NOTE — Progress Notes (Addendum)
      [address]       [place] [address]             [phone]      4 Days Post-Op Procedure(s) (LRB): CORONARY ARTERY BYPASS GRAFTING (CABG), ON PUMP, TIMES FOUR, USING LEFT INTERNAL MAMMARY ARTERY, AND BILATERAL GREATER SAPHENOUS VEIN HARVESTED ENDOSCOPICALLY (N/A) TRANSESOPHAGEAL ECHOCARDIOGRAM (TEE) (N/A) Subjective: He feels okay this morning. He had a bowel movement last night. Plans to walk in the halls today.   Objective: Vital signs in last 24 hours: Temp:  [97.3 F (36.3 C)-98.2 F (36.8 C)] 97.9 F (36.6 C) (01/07 0450) Pulse Rate:  [53-76] 67 (01/07 0450) Cardiac Rhythm: Normal sinus rhythm;Sinus bradycardia (01/06 2140) Resp:  [14-25] 21 (01/07 0450) BP: (109-148)/(55-94) 139/79 (01/07 0450) SpO2:  [97 %-100 %] 98 % (01/07 0450) Weight:  [87.1 kg] 87.1 kg (01/07 0450)     Intake/Output from previous day: 01/06 0701 - 01/07 0700 In: 1310 [P.O.:995; Blood:315] Out: 500 [Urine:500] Intake/Output this shift: No intake/output data recorded.  General appearance: alert, cooperative and no distress Heart: regular rate and rhythm, S1, S2 normal, no murmur, click, rub or gallop Lungs: clear to auscultation bilaterally Abdomen: soft, non-tender; bowel sounds normal; no masses,  no organomegaly Extremities: extremities normal, atraumatic, no cyanosis or edema Wound: clean and dry  Lab Results: Recent Labs    11/16/18 0236 11/17/18 0336  WBC 13.0* 10.6*  HGB 7.6* 9.0*  HCT 21.8* 26.3*  PLT 134* 179   BMET:  Recent Labs    11/16/18 0236 11/17/18 0336  NA 132* 133*  K 4.0 4.1  CL 102 102  CO2 20* 21*  GLUCOSE 94 112*  BUN 18 20  CREATININE 0.98 0.87  CALCIUM 9.0 9.1    PT/INR: No results for input(s): LABPROT, INR in the last 72 hours. ABG    Component Value Date/Time   PHART 7.414 11/14/2018 0645   HCO3 20.4 11/14/2018 0645   TCO2 21 (L) 11/14/2018 0645   ACIDBASEDEF 4.0 (H) 11/14/2018 0645   O2SAT 66.6 11/15/2018 0500    CBG (last 3)  Recent Labs    11/15/18 2252 11/16/18 0617 11/16/18 2131  GLUCAP 129* 94 111*    Assessment/Plan: S/P Procedure(s) (LRB): CORONARY ARTERY BYPASS GRAFTING (CABG), ON PUMP, TIMES FOUR, USING LEFT INTERNAL MAMMARY ARTERY, AND BILATERAL GREATER SAPHENOUS VEIN HARVESTED ENDOSCOPICALLY (N/A) TRANSESOPHAGEAL ECHOCARDIOGRAM (TEE) (N/A)  1. CV-NSR in the 70s, BP well controlled. Tolerating ASA, coreg, and statin. Will discontinue EPW today. 2. Pulm-tolerating room air with excellent oxygenation. Chest xray showed: Persistent bibasilar atelectasis/infiltrate and small effusions. These changes are most prominent on left.  3. Renal-creatinine 0.87, electrolytes okay. Continue Lasix 40mg  daily for fluid overload.  4. H and H 9.0/26.3, expected acute blood loss anemia. Good response to 1 unit pRBC.  5. Blood glucose well controlled.  6. + bowel movement last night.  Plan: Remove EPW, continue diuresis for fluid overload. Continue to ambulate in the halls. Encouraged use of incentive spirometer. Likely home in the next few days.    LOS: 11 days    [name] 11/17/2018   patient examined and medical record reviewed,agree with above note. Kathlee Nations Trigt III 11/17/2018

## 2018-11-17 NOTE — Progress Notes (Signed)
Pacer wires removed and intact. Pt tolerated well. Vitals monitored q 15. Pt agrees to 1 hr of bedrest. Will continue to monitor. Lacy Duverney, RN

## 2018-11-17 NOTE — Progress Notes (Signed)
Patient ambulated in hallway approximately 200 ft with spouse. Tolerated well. Will continue to monitor. David Mack A Tarshia Kot, RN

## 2018-11-17 NOTE — Discharge Summary (Addendum)
[address]       [place] [address]             [phone]      Physician Discharge Summary  Patient ID: David Mack MRN: [id number] DOB/AGE: [birth year] 73 y.o.  Admit date: 11/06/2018 Discharge date: 11/19/2018  Admission Diagnoses:  Patient Active Problem List   Diagnosis Date Noted  . Coronary artery disease 11/13/2018  . Acute systolic CHF (congestive heart failure) (HCC) 11/06/2018  . Acute on chronic congestive heart failure (HCC)   . Hyponatremia   . Multiple vessel coronary artery disease     Discharge Diagnoses:  Active Problems:   Acute systolic CHF (congestive heart failure) (HCC)   Coronary artery disease   S/P CABG x 4   Discharged Condition: good  HPI:   73 year old nondiabetic smoker with previously diagnosed ischemic cardiomyopathy at [facility] admitted through our emergency department with worsening symptoms of heart failure.  He presented to [facility] earlier in the month with chest pain, shortness of breath, edema, decreased exercise tolerance, and non-ST elevation MI.  At [facility] he underwent left heart cath which showed three-vessel disease, images are not available at this time.  He also underwent a TEE which apparently showed moderate MR and severe LV dysfunction with EF 20-25%.  The patient was being recommended for possible surgical coronary revascularization when he left the hospital.  He subsequently gained 10 pounds in fluid and presented to our emergency department requesting care.  On presentation here he was hyponatremic with a sodium of 114.  He was in atrial flutter with 3+ edema to the thigh.  He was placed on a Lasix drip with significant diuresis and improvement in symptoms.  He underwent a MRI cardiac viability study which has not yet been officially reported but appears to have evidence of anterior viability.  I have reviewed the transthoracic echocardiogram performed at this hospital and  the patient appears to have severe inferior lateral hypokinesia, 2+ moderate MR, and EF of 25%.  The patient's functional status and exercise tolerance has significantly dropped in the past 3 months.  The patient has been unable to walk more than across the room and has been unable to do any yard work or his usual activities.  He has had a fall in the recent past with possible left rib bruises or fracture.  He continues to smoke.  There is no symptoms of TIA DVT or nausea.  He is a nondiabetic.  Hospital Course:   On 11/14/2018 Mr. Monroy underwent a CABG x 4 with Dr. Donata Clay. He tolerated the procedure well and was transferred to the surgical ICU in stable condition. He received 1 unit of pRBC initially for anemia. POD 1 we discontinued his chest tubes and swan ganz catheter. We began to mobilize the patient. We gave him some iron for expected acute blood loss anemia. POD 2 we gave him another unit of pRBC for anemia. His weight was down to baseline. He was walking 267ft around the unit on room air. We planned for transfer to the stepdown unit for continued care. POD 4 he continued to progress. He was ambulating on the floor and using his incentive spirometer. He had a bowel movement last night. We discontinued his epicardial pacing wires. We continued lasix for fluid overload. We continued to encourage incentive spirometer for small pleural effusions and atelectasis. Today, he was ambulating with limited assistance, tolerating room air, his incisions are  healing well and he is ready for discharge home.      Consults: None  Significant Diagnostic Studies:  CLINICAL DATA:  History of CABG.  EXAM: CHEST - 2 VIEW  COMPARISON:  11/16/2018 11/15/2018.  11/06/2018.  CT 11/10/2018.  FINDINGS: Mediastinum hilar structures normal. Prior CABG. Cardiomegaly with normal pulmonary vascularity. Interim removal of left chest tube. No pneumothorax. Persistent bibasilar atelectasis/infiltrate  and bilateral pleural effusions. These changes are most prominent on the left. Similar findings on prior exam. Reference is made to prior CT report for discussion of tiny pulmonary nodules present. Aortic and great vessel vascular calcification. Stable thoracic vertebral body compression fractures. No acute bony abnormality.  IMPRESSION: 1.  Interim removal of left chest tube.  No pneumothorax.  2. Persistent bibasilar atelectasis/infiltrate and small effusions. These changes are most prominent on left. Similar findings noted on prior study.   Electronically Signed   By: Maisie Fushomas  Register   On: 11/17/2018 07:18   Treatments:    NAME: Patria ManeMOORE, Abdoul W. MEDICAL RECORD [id number] ACCOUNT [id number] DATE OF BIRTH:Jan 02, 1946 FACILITY: [facility] LOCATION: [facility] PHYSICIAN:Cailee Blanke VAN TRIGT III, MD  OPERATIVE REPORT  DATE OF PROCEDURE:  11/13/2018  PROCEDURE PERFORMED: 1.  Coronary artery bypass grafting x4 (left internal mammary artery to left anterior descending, saphenous vein graft to ramus intermediate, saphenous vein graft to obtuse marginal, saphenous vein graft to posterior descending). 2.  Bilateral endoscopic harvest of left and right greater saphenous veins.  SURGEON:  Kerin PernaPeter Van Trigt, MD  ASSISTANT:  Doree Fudgeonielle Zimmerman, PA-C.  ANESTHESIA:  General by Dr. Arrie AranStephen Turk.  PREOPERATIVE DIAGNOSES:  Ischemic cardiomyopathy, non-ST elevation myocardial infarction, severe 3-vessel coronary artery disease, moderate central mitral regurgitation.  POSTOPERATIVE DIAGNOSES:  Ischemic cardiomyopathy, non-ST elevation myocardial infarction, severe 3-vessel coronary artery disease, moderate central mitral regurgitation.  Discharge Exam: Blood pressure 130/67, pulse 68, temperature 98.4 F (36.9 C), temperature source Oral, resp. rate (!) 22, height 5\' 6"  (1.676 m), weight 86.5 kg, SpO2 99 %.   General appearance: alert, cooperative and no distress Heart: regular rate  and rhythm, S1, S2 normal, no murmur, click, rub or gallop Lungs: clear to auscultation bilaterally Abdomen: soft, non-tender; bowel sounds normal; no masses,  no organomegaly Extremities: extremities normal, atraumatic, no cyanosis or edema Wound: clean and dry  Disposition: Discharge disposition: 01-Home or Self Care       Discharge Instructions    Amb Referral to Cardiac Rehabilitation   Complete by:  As directed    Diagnosis:  CABG   CABG X ___:  4     Allergies as of 11/19/2018      Reactions   Albuterol Nausea And Vomiting, Other (See Comments)   Dizziness - unable to stand   Pulmicort [budesonide] Other (See Comments)   Makes very jittery and feels worse rather than better      Medication List    STOP taking these medications   amLODipine-benazepril 10-20 MG capsule Commonly known as:  LOTREL   budesonide-formoterol 160-4.5 MCG/ACT inhaler Commonly known as:  SYMBICORT Replaced by:  mometasone-formoterol 200-5 MCG/ACT Aero   metoprolol tartrate 25 MG tablet Commonly known as:  LOPRESSOR   nitroGLYCERIN 0.4 MG SL tablet Commonly known as:  NITROSTAT     TAKE these medications   aspirin 325 MG EC tablet Take 1 tablet (325 mg total) by mouth daily. What changed:    medication strength  how much to take   carvedilol 6.25 MG tablet Commonly known as:  COREG Take 1 tablet (6.25 mg  total) by mouth 2 (two) times daily with a meal.   furosemide 40 MG tablet Commonly known as:  LASIX Take 40 mg by mouth every morning.   guaiFENesin 600 MG 12 hr tablet Commonly known as:  MUCINEX Take 1,200 mg by mouth 2 (two) times daily.   mometasone-formoterol 200-5 MCG/ACT Aero Commonly known as:  DULERA Inhale 2 puffs into the lungs 2 (two) times daily. Replaces:  budesonide-formoterol 160-4.5 MCG/ACT inhaler   nicotine 21 mg/24hr patch Commonly known as:  NICODERM CQ - dosed in mg/24 hours Place 21 mg onto the skin daily. Alternate shoulder   oxyCODONE 5 MG  immediate release tablet Commonly known as:  Oxy IR/ROXICODONE Take 1 tablet (5 mg total) by mouth every 6 (six) hours as needed for severe pain.   potassium chloride SA 20 MEQ tablet Commonly known as:  K-DUR,KLOR-CON Take 1 tablet (20 mEq total) by mouth daily.   rosuvastatin 20 MG tablet Commonly known as:  CRESTOR Take 20 mg by mouth every evening.            Durable Medical Equipment  (From admission, onward)         Start     Ordered   11/18/18 1425  For home use only DME 4 wheeled rolling walker with seat  Once    Comments:  Post op CABG  Question:  Patient needs a walker to treat with the following condition  Answer:  Weakness generalized   11/18/18 1425         Follow-up Information    Kerin Perna, MD Follow up.   Specialty:  Cardiothoracic Surgery Why:  Your routine follow-up appointment is on 12/16/2018 at 10:30am. Please arrive at 10:00am for a chest xray located at [facility] which is on the first floor of our building.  Contact information: [address] [employer] [address] [place] [address] [phone]        Toma Deiters, MD. Call in 1 day(s).   Specialty:  Internal Medicine Contact information: [address] [place] [address] [phone]        Chrystie Nose, MD .   Specialty:  Cardiology Contact information: [address] [place] [address] [place] [address] [phone]        Azalee Course, [place] Follow up.   Specialties:  Cardiology, Radiology Why:  Azalee Course, PA-C 1/23 @3 :30 pm ([facility])  Contact information: [address] [place] [address] [phone]          The patient has been discharged on:   1.Beta Blocker:  Yes [ yes  ]                              No   [   ]                              If No, reason:  2.Ace Inhibitor/ARB: Yes [   ]                                     No  [ no   ]  If No, reason: normotensive  3.Statin:   Yes [  yes  ]                  No  [   ]                  If No, reason:  4.Ecasa:  Yes  [ yes  ]                  No   [   ]                  If No, reason:   Signed: Sharlene Doryessa N Conte 11/19/2018, patient examined and medical record reviewed,agree with above note. Kathlee Nationseter Van Trigt III 11/23/2018

## 2018-11-17 NOTE — Progress Notes (Signed)
CARDIAC REHAB PHASE I   PRE:  Rate/Rhythm: 70 SR  BP:  Sitting: 139/78      SaO2: 94 RA  MODE:  Ambulation: 180 ft   POST:  Rate/Rhythm: 81 SR  BP:  Sitting: pt in BR    SaO2: 91 RA   Pt ambulated 169ft in hallway assist of one with front wheel walker. Pt took several standing rest breaks, would not state what was bothering him though. Pt to bathroom upon return to room. Bathroom call light within reach. Encouraged to walk for a third time today. Will continue to follow.  [id number] Reynold Bowen, RN BSN 11/17/2018 11:15 AM

## 2018-11-17 NOTE — Progress Notes (Signed)
Pt ambulated in hall with walker this morning. Ambulated 132ft. Tolerated welll. Is resting in chair. Will continue to monitor. Victorino December, RN

## 2018-11-17 NOTE — Care Management Important Message (Signed)
Important Message  Patient Details  Name: David Mack MRN: [id number] Date of Birth: [birth year]   Medicare Important Message Given:  Yes    [name] [name] 11/17/2018, 2:22 PM

## 2018-11-18 MED FILL — Calcium Chloride Inj 10%: INTRAVENOUS | Qty: 10 | Status: AC

## 2018-11-18 MED FILL — Electrolyte-R (PH 7.4) Solution: INTRAVENOUS | Qty: 3000 | Status: AC

## 2018-11-18 MED FILL — Sodium Bicarbonate IV Soln 8.4%: INTRAVENOUS | Qty: 50 | Status: AC

## 2018-11-18 MED FILL — Sodium Chloride IV Soln 0.9%: INTRAVENOUS | Qty: 2000 | Status: AC

## 2018-11-18 MED FILL — Heparin Sodium (Porcine) Inj 1000 Unit/ML: INTRAMUSCULAR | Qty: 20 | Status: AC

## 2018-11-18 MED FILL — Mannitol IV Soln 20%: INTRAVENOUS | Qty: 500 | Status: AC

## 2018-11-18 MED FILL — Lidocaine HCl(Cardiac) IV PF Soln Pref Syr 100 MG/5ML (2%): INTRAVENOUS | Qty: 5 | Status: AC

## 2018-11-18 NOTE — Progress Notes (Signed)
Encouraged ambulation, patient stated would walk after supper. Patient has been up to chair today. Nomie Buchberger, Randall An rN

## 2018-11-18 NOTE — Care Management Note (Signed)
Case Management Note Donn Pierini RN, BSN Transitions of Care Unit 4E- RN Case Manager [phone]  Patient Details  Name: David Mack MRN: [id number] Date of Birth: 27-Jul-1946  Subjective/Objective:    Pt admitted with HF, s/p CABG                Action/Plan: PTA pt lived at home with wife, plan to return home, CM spoke with pt and wife at bedside for transition of care needs, pt would like rollator for home will place order and notify [facility] for DME need and have delivered prior to discharge. Discussed possible HH needs- and list provided Per CMS website with star ratings (copy placed in shadow chart)- CM will f/u for choice if [facility] is needed.   Expected Discharge Date:                  Expected Discharge Plan:  Home/Self Care  In-House Referral:     Discharge planning Services  CM Consult  Post Acute Care Choice:  Durable Medical Equipment, Home Health Choice offered to:  Patient, Spouse  DME Arranged:  Walker rolling with seat DME Agency:  [facility]  HH Arranged:    HH Agency:     Status of Service:  In process, will continue to follow  If discussed at Long Length of Stay Meetings, dates discussed:    Discharge Disposition:   Additional Comments:  Darrold Span, RN 11/18/2018, 2:26 PM

## 2018-11-18 NOTE — Progress Notes (Addendum)
patient examined and medical record reviewed,agree with above note. David Mack 11/18/2018       [address]       [place] [address]             [phone]      5 Days Post-Op Procedure(s) (LRB): CORONARY ARTERY BYPASS GRAFTING (CABG), ON PUMP, TIMES FOUR, USING LEFT INTERNAL MAMMARY ARTERY, AND BILATERAL GREATER SAPHENOUS VEIN HARVESTED ENDOSCOPICALLY (N/A) TRANSESOPHAGEAL ECHOCARDIOGRAM (TEE) (N/A) Subjective: Feels okay this morning. Weight continues to decrease per patient  Objective: Vital signs in last 24 hours: Temp:  [97.8 F (36.6 C)-98 F (36.7 C)] 97.8 F (36.6 C) (01/08 0630) Pulse Rate:  [64-81] 81 (01/08 0630) Cardiac Rhythm: Normal sinus rhythm;Sinus bradycardia (01/07 2030) Resp:  [13-28] 13 (01/08 0630) BP: (122-139)/(62-78) 128/67 (01/08 0630) SpO2:  [97 %-100 %] 99 % (01/08 0630) Weight:  [87 kg] 87 kg (01/08 0630)     Intake/Output from previous day: 01/07 0701 - 01/08 0700 In: 1320 [P.O.:1320] Out: 2501 [Urine:2500; Stool:1] Intake/Output this shift: No intake/output data recorded.  General appearance: alert, cooperative and no distress Heart: regular rate and rhythm, S1, S2 normal, no murmur, click, rub or gallop Lungs: clear to auscultation bilaterally Abdomen: soft, non-tender; bowel sounds normal; no masses,  no organomegaly Extremities: extremities normal, atraumatic, no cyanosis or edema Wound: clean and dry  Lab Results: Recent Labs    11/16/18 0236 11/17/18 0336  WBC 13.0* 10.6*  HGB 7.6* 9.0*  HCT 21.8* 26.3*  PLT 134* 179   BMET:  Recent Labs    11/16/18 0236 11/17/18 0336  NA 132* 133*  K 4.0 4.1  CL 102 102  CO2 20* 21*  GLUCOSE 94 112*  BUN 18 20  CREATININE 0.98 0.87  CALCIUM 9.0 9.1    PT/INR: No results for input(s): LABPROT, INR in the last 72 hours. ABG    Component Value Date/Time   PHART 7.414 11/14/2018 0645   HCO3 20.4 11/14/2018 0645   TCO2 21 (L) 11/14/2018 0645   ACIDBASEDEF 4.0 (H) 11/14/2018 0645   O2SAT 66.6 11/15/2018 0500   CBG (last 3)  Recent Labs    11/15/18 2252 11/16/18 0617 11/16/18 2131  GLUCAP 129* 94 111*    Assessment/Plan: S/P Procedure(s) (LRB): CORONARY ARTERY BYPASS GRAFTING (CABG), ON PUMP, TIMES FOUR, USING LEFT INTERNAL MAMMARY ARTERY, AND BILATERAL GREATER SAPHENOUS VEIN HARVESTED ENDOSCOPICALLY (N/A) TRANSESOPHAGEAL ECHOCARDIOGRAM (TEE) (N/A)   1. CV-NSR in the 70s, BP well controlled. Tolerating ASA, coreg, and statin. EPW out. 2. Pulm-tolerating room air with excellent oxygenation. Chest xray showed: Persistent bibasilar atelectasis/infiltrate and small effusions. These changes are most prominent on left.  3. Renal-creatinine 0.87, electrolytes okay. Continue Lasix 40mg  daily for fluid overload.  4. H and H 9.0/26.3, expected acute blood loss anemia. 5. Blood glucose well controlled.  6. + bowel movement   Plan: Continue diuresis for fluid overload. Continue to ambulate in the halls. Plan for discharge tomorrow.   LOS: 12 days    David Mack 11/18/2018

## 2018-11-18 NOTE — Progress Notes (Signed)
Patient ambulated in hallway with spouse and rollator. Will monitor patient. Neeva Trew, Randall An RN

## 2018-11-18 NOTE — Progress Notes (Signed)
CARDIAC REHAB PHASE I   PRE:  Rate/Rhythm: 69 SR  BP:  Sitting: 115/74      SaO2: 93 RA  MODE:  Ambulation: 270 ft 98 peak HR  POST:  Rate/Rhythm: 77SR with PVCs  BP:  Sitting: 134/77    SaO2: 91 RA   Pt ambulated 267ft in hallway standby assist with rollator. Pt took one sitting rest break. Pt states he "feels good today." Education completed with pt and wife. Pt instructed to shower and monitor incisions daily. Encouraged continued use of IS, Flutter, walks and sternal precautions. Pt given in-the-tube sheet, and heart healthy diet. Reviewed restrictions and exercise guidelines. Will refer to CRP II [place]. Encouraged two more walks today, will continue to follow.  [phone] Reynold Bowen, RN BSN 11/18/2018 10:53 AM

## 2018-11-18 NOTE — Progress Notes (Signed)
Progress Note  Patient Name: David Mack Date of Encounter: 11/18/2018  Primary Cardiologist: Chrystie Nose, MD   Subjective   Feels well, no concerns.   Inpatient Medications    Scheduled Meds: . aspirin EC  325 mg Oral Daily   Or  . aspirin  324 mg Per Tube Daily  . bisacodyl  10 mg Oral Daily   Or  . bisacodyl  10 mg Rectal Daily  . carvedilol  6.25 mg Oral BID WC  . dextromethorphan-guaiFENesin  1 tablet Oral BID  . docusate sodium  200 mg Oral Daily  . enoxaparin (LOVENOX) injection  30 mg Subcutaneous Q24H  . feeding supplement (ENSURE ENLIVE)  237 mL Oral TID WC  . ferrous fumarate-b12-vitamic C-folic acid  1 capsule Oral BID WC  . furosemide  40 mg Oral Daily  . mometasone-formoterol  2 puff Inhalation BID  . nicotine  21 mg Transdermal Daily  . pantoprazole  40 mg Oral Daily  . potassium chloride  20 mEq Oral BID  . rosuvastatin  20 mg Oral QPM   Continuous Infusions: . sodium chloride    . sodium chloride 10 mL/hr at 11/13/18 1800   PRN Meds: ondansetron (ZOFRAN) IV, oxyCODONE, traMADol   Vital Signs    Vitals:   11/18/18 0812 11/18/18 1015 11/18/18 1243 11/18/18 1749  BP:  134/77 126/66 131/70  Pulse:  71 78 73  Resp:   (!) 29   Temp:   98 F (36.7 C)   TempSrc:   Oral   SpO2: 99%  98%   Weight:      Height:        Intake/Output Summary (Last 24 hours) at 11/18/2018 1904 Last data filed at 11/18/2018 1200 Gross per 24 hour  Intake -  Output 650 ml  Net -650 ml   Filed Weights   11/16/18 0500 11/17/18 0450 11/18/18 0630  Weight: 87.9 kg 87.1 kg 87 kg    Telemetry    NSR - Personally Reviewed  ECG    No new  Physical Exam   GEN: No acute distress.   Neck: No JVD Cardiac: regular rhythm, normal rate, no murmurs, rubs, or gallops.  Chest: well healing sternotomy wound Respiratory: Diminished breath sounds bilaterally. GI: Soft, nontender, non-distended  MS: 1+  Bilateral edema; No deformity. Neuro:  Nonfocal  Psych: Normal  affect   Labs    Chemistry Recent Labs  Lab 11/15/18 0448 11/16/18 0236 11/17/18 0336  NA 130* 132* 133*  K 3.8 4.0 4.1  CL 102 102 102  CO2 21* 20* 21*  GLUCOSE 106* 94 112*  BUN 11 18 20   CREATININE 0.87 0.98 0.87  CALCIUM 8.8* 9.0 9.1  PROT  --  5.9*  --   ALBUMIN  --  3.3*  --   AST  --  24  --   ALT  --  15  --   ALKPHOS  --  39  --   BILITOT  --  1.0  --   GFRNONAA >60 >60 >60  GFRAA >60 >60 >60  ANIONGAP 7 10 10      Hematology Recent Labs  Lab 11/15/18 0750 11/16/18 0236 11/17/18 0336  WBC 11.3* 13.0* 10.6*  RBC 2.43* 2.32* 2.79*  HGB 7.8* 7.6* 9.0*  HCT 23.0* 21.8* 26.3*  MCV 94.7 94.0 94.3  MCH 32.1 32.8 32.3  MCHC 33.9 34.9 34.2  RDW 13.2 12.8 13.4  PLT 137* 134* 179    Cardiac EnzymesNo results  for input(s): TROPONINI in the last 168 hours. No results for input(s): TROPIPOC in the last 168 hours.   BNPNo results for input(s): BNP, PROBNP in the last 168 hours.   DDimer No results for input(s): DDIMER in the last 168 hours.   Radiology    Dg Chest 2 View  Result Date: 11/17/2018 CLINICAL DATA:  History of CABG. EXAM: CHEST - 2 VIEW COMPARISON:  11/16/2018 11/15/2018.  11/06/2018.  CT 11/10/2018. FINDINGS: Mediastinum hilar structures normal. Prior CABG. Cardiomegaly with normal pulmonary vascularity. Interim removal of left chest tube. No pneumothorax. Persistent bibasilar atelectasis/infiltrate and bilateral pleural effusions. These changes are most prominent on the left. Similar findings on prior exam. Reference is made to prior CT report for discussion of tiny pulmonary nodules present. Aortic and great vessel vascular calcification. Stable thoracic vertebral body compression fractures. No acute bony abnormality. IMPRESSION: 1.  Interim removal of left chest tube.  No pneumothorax. 2. Persistent bibasilar atelectasis/infiltrate and small effusions. These changes are most prominent on left. Similar findings noted on prior study. Electronically Signed    By: Maisie Fushomas  Register   On: 11/17/2018 07:18     Assessment & Plan   Active Problems:   Acute systolic CHF (congestive heart failure) (HCC)   Coronary artery disease   S/P CABG x 4    CARDIOLOGY RECOMMENDATIONS:  Discharge is anticipated in the next 48 hours. Recommendations for medications and follow up:  Discharge Medications: ASA 325 mg until TCTS f/u then transition to 81 mg daily, carvedilol 6.25 mg BID, lasix 40 mg po daily, potassium 40 mEq daily until CV follow up, rosuvastatin 20 mg daily,  Continue medications as they are currently listed in the [facility]. Exceptions to the above:  Consider transitioning potassium dosing to once daily as noted above.  Follow Up: The patient's Primary Cardiologist is Chrystie NoseKenneth C Hilty, MD   [place] [facility] 12/08/2018.  Signed,  Parke PoissonGayatri A Acharya, MD  7:05 PM 11/18/2018  [facility]   For questions or updates, please contact [facility] Please consult [email or web address] for contact info under        Signed, Parke PoissonGayatri A Acharya, MD  11/18/2018, 7:04 PM

## 2018-11-19 LAB — CBC
HCT: 28 % — ABNORMAL LOW (ref 39.0–52.0)
Hemoglobin: 9.3 g/dL — ABNORMAL LOW (ref 13.0–17.0)
MCH: 31.5 pg (ref 26.0–34.0)
MCHC: 33.2 g/dL (ref 30.0–36.0)
MCV: 94.9 fL (ref 80.0–100.0)
Platelets: 242 10*3/uL (ref 150–400)
RBC: 2.95 MIL/uL — ABNORMAL LOW (ref 4.22–5.81)
RDW: 13.1 % (ref 11.5–15.5)
WBC: 9.3 10*3/uL (ref 4.0–10.5)
nRBC: 0 % (ref 0.0–0.2)

## 2018-11-19 MED ORDER — OXYCODONE HCL 5 MG PO TABS: 5.0000 mg | ORAL_TABLET | Freq: Four times a day (QID) | ORAL | 0 refills | Status: DC | PRN

## 2018-11-19 MED ORDER — MOMETASONE FURO-FORMOTEROL FUM 200-5 MCG/ACT IN AERO: 2.0000 | INHALATION_SPRAY | Freq: Two times a day (BID) | RESPIRATORY_TRACT | 0 refills | Status: DC

## 2018-11-19 MED ORDER — POTASSIUM CHLORIDE CRYS ER 20 MEQ PO TBCR: 20.0000 meq | EXTENDED_RELEASE_TABLET | Freq: Every day | ORAL | 1 refills | Status: DC

## 2018-11-19 MED ORDER — CARVEDILOL 6.25 MG PO TABS: 6.2500 mg | ORAL_TABLET | Freq: Two times a day (BID) | ORAL | 1 refills | Status: DC

## 2018-11-19 MED ORDER — ASPIRIN 325 MG PO TBEC: 325.0000 mg | DELAYED_RELEASE_TABLET | Freq: Every day | ORAL | 0 refills | Status: DC

## 2018-11-19 NOTE — Progress Notes (Signed)
Pt ambulated with rollator 470 feet, pt tolerated well will continue to monitor

## 2018-11-19 NOTE — Progress Notes (Signed)
CARDIAC REHAB PHASE I   Offered to walk with pt, pt declining stating he is waiting to d/c. Pt and wife decline questions or concerns regarding yesterdays education. Pt waiting on rollator to be delivered to room for d/c. Pt referred to [facility].   [id number] Reynold Bowen, RN BSN 11/19/2018 9:11 AM

## 2018-11-19 NOTE — Discharge Instructions (Signed)

## 2018-11-19 NOTE — Progress Notes (Signed)
Reviewed AVS discharge instructions with patient/caregiver. Patient/caregiver verbalizes understanding of instructions received. AVS and prescriptions received by patient/caregiver. If present, telemetry box removed and central cardiac monitoring department notified of discharge. Peripheral IV removed, site benign with tip intact. Patient has received rollator and wife at bedside. Wife has questions about whether patient should be on iron tablets per Dr Donata Clay. Will page for orders.

## 2018-11-19 NOTE — Progress Notes (Signed)
Pt ambulated 300 feet with Rolator walker tolerated well

## 2018-11-19 NOTE — Progress Notes (Addendum)
      [address]       [place] [address]             [phone]      6 Days Post-Op Procedure(s) (LRB): CORONARY ARTERY BYPASS GRAFTING (CABG), ON PUMP, TIMES FOUR, USING LEFT INTERNAL MAMMARY ARTERY, AND BILATERAL GREATER SAPHENOUS VEIN HARVESTED ENDOSCOPICALLY (N/A) TRANSESOPHAGEAL ECHOCARDIOGRAM (TEE) (N/A) Subjective: Feels good this morning and ready for discharge.  Objective: Vital signs in last 24 hours: Temp:  [97.9 F (36.6 C)-98.4 F (36.9 C)] 98.4 F (36.9 C) (01/09 0320) Pulse Rate:  [64-78] 68 (01/09 0320) Cardiac Rhythm: Normal sinus rhythm (01/09 0702) Resp:  [17-29] 22 (01/09 0320) BP: (115-134)/(66-77) 130/67 (01/09 0320) SpO2:  [98 %-100 %] 99 % (01/09 0320) FiO2 (%):  [98 %] 98 % (01/08 2153) Weight:  [86.5 kg] 86.5 kg (01/09 0320)     Intake/Output from previous day: 01/08 0701 - 01/09 0700 In: 120 [P.O.:120] Out: 300 [Urine:300] Intake/Output this shift: No intake/output data recorded.  General appearance: alert, cooperative and no distress Heart: regular rate and rhythm, S1, S2 normal, no murmur, click, rub or gallop Lungs: clear to auscultation bilaterally Abdomen: soft, non-tender; bowel sounds normal; no masses,  no organomegaly Extremities: extremities normal, atraumatic, no cyanosis or edema Wound: clean and dry  Lab Results: Recent Labs    11/17/18 0336  WBC 10.6*  HGB 9.0*  HCT 26.3*  PLT 179   BMET:  Recent Labs    11/17/18 0336  NA 133*  K 4.1  CL 102  CO2 21*  GLUCOSE 112*  BUN 20  CREATININE 0.87  CALCIUM 9.1    PT/INR: No results for input(s): LABPROT, INR in the last 72 hours. ABG    Component Value Date/Time   PHART 7.414 11/14/2018 0645   HCO3 20.4 11/14/2018 0645   TCO2 21 (L) 11/14/2018 0645   ACIDBASEDEF 4.0 (H) 11/14/2018 0645   O2SAT 66.6 11/15/2018 0500   CBG (last 3)  Recent Labs    11/16/18 2131  GLUCAP 111*    Assessment/Plan: S/P Procedure(s) (LRB): CORONARY ARTERY  BYPASS GRAFTING (CABG), ON PUMP, TIMES FOUR, USING LEFT INTERNAL MAMMARY ARTERY, AND BILATERAL GREATER SAPHENOUS VEIN HARVESTED ENDOSCOPICALLY (N/A) TRANSESOPHAGEAL ECHOCARDIOGRAM (TEE) (N/A)  1. CV-NSR in the 70s, BP well controlled. Tolerating ASA, coreg, and statin. EPW out. 2. Pulm-tolerating room air with excellent oxygenation. Chest xray yesterday stable 3. Renal-creatinine 0.87, electrolytes okay. Continue Lasix 40mg  daily for fluid overload. 4. H and H 9.0/26.3, expected acute blood loss anemia. 5. Blood glucose well controlled.  Plan: Discharge today. Will give a few days of lasix for fluid overload.    LOS: 13 days    [name] 11/19/2018  agree with assessment and plan Check CBC prior to discharge to assess severity of anemia patient examined and medical record reviewed,agree with above note. Kathlee Nations Trigt III 11/19/2018

## 2018-11-19 NOTE — Care Management (Signed)
Notified [facility] that patient needs rollator delivered to room prior to DC today.

## 2018-11-19 NOTE — Progress Notes (Signed)
Chest tube sutures removed. Cleaned with betadine and steristrips applied.

## 2018-11-20 ENCOUNTER — Ambulatory Visit: Payer: PRIVATE HEALTH INSURANCE | Admitting: Cardiology

## 2018-11-20 MED FILL — Magnesium Sulfate Inj 50%: INTRAMUSCULAR | Qty: 10 | Status: AC

## 2018-11-20 MED FILL — Potassium Chloride Inj 2 mEq/ML: INTRAVENOUS | Qty: 40 | Status: AC

## 2018-11-20 MED FILL — Heparin Sodium (Porcine) Inj 1000 Unit/ML: INTRAMUSCULAR | Qty: 30 | Status: AC

## 2018-11-20 MED FILL — Vancomycin HCl For IV Soln 1 GM (Base Equivalent): INTRAVENOUS | Qty: 1000 | Status: AC

## 2018-12-03 ENCOUNTER — Ambulatory Visit: Payer: PRIVATE HEALTH INSURANCE | Admitting: Physician Assistant

## 2018-12-07 DIAGNOSIS — R911 Solitary pulmonary nodule: Secondary | ICD-10-CM | POA: Insufficient documentation

## 2018-12-07 DIAGNOSIS — Z720 Tobacco use: Secondary | ICD-10-CM | POA: Insufficient documentation

## 2018-12-07 DIAGNOSIS — I255 Ischemic cardiomyopathy: Secondary | ICD-10-CM | POA: Insufficient documentation

## 2018-12-07 NOTE — Progress Notes (Signed)
Cardiology Office Note    Date:  12/08/2018   ID:  UEL DAVIDOW, DOB 07-Oct-1946, MRN [id number]  PCP:  Neale Burly, MD  Cardiologist: Pixie Casino, MD EPS: None  Chief Complaint  Patient presents with  . Hospitalization Follow-up    History of Present Illness:  David Mack is a 73 y.o. male with history of ischemic cardiomyopathy diagnosed at [facility] 10/2018.  He had an NSTEMI with left heart cath revealing three-vessel CAD TEE showed moderate MR severe LV dysfunction EF 20 to 25% he was referred for surgical revascularization.  He came to the emergency room with acute shortness of breath and severe hyponatremia and underwent CABG times 4 LIMA to the LAD, SVG to the ramus intermediate, SVG to OM, SVG to PDA 11/14/18.  Also has history of tobacco abuse and incidental finding of nodule on chest CT will need repeat in 6 months.  Patient comes in accompanied by his wife. Not moving around much. Will think about cardiac rehab. Quit smoking. Has had increase leg swelling since he left the hospital and has gained 6 pounds.      Past Medical History:  Diagnosis Date  . Arthritis    "knees" (11/10/2018)  . COPD (chronic obstructive pulmonary disease) ([place])   . Hypertension     Past Surgical History:  Procedure Laterality Date  . CATARACT EXTRACTION W/PHACO Left 07/06/2015   Procedure: CATARACT EXTRACTION PHACO AND INTRAOCULAR LENS PLACEMENT (IOC);  Surgeon: Tonny Branch, MD;  Location: [facility];  Service: Ophthalmology;  Laterality: Left;  CDE 8.33  . CATARACT EXTRACTION W/PHACO Right 08/21/2015   Procedure: CATARACT EXTRACTION PHACO AND INTRAOCULAR LENS PLACEMENT ([place]);  Surgeon: Tonny Branch, MD;  Location: [facility];  Service: Ophthalmology;  Laterality: Right;  CDE:9.89  . CORONARY ARTERY BYPASS GRAFT N/A 11/13/2018   Procedure: CORONARY ARTERY BYPASS GRAFTING (CABG), ON PUMP, TIMES FOUR, USING LEFT INTERNAL MAMMARY ARTERY, AND BILATERAL GREATER SAPHENOUS VEIN HARVESTED  ENDOSCOPICALLY;  Surgeon: Ivin Poot, MD;  Location: [place];  Service: Open Heart Surgery;  Laterality: N/A;  . KNEE ARTHROSCOPY Right 2006  . KNEE ARTHROSCOPY Left 2000  . RIGHT HEART CATH N/A 11/12/2018   Procedure: RIGHT HEART CATH;  Surgeon: Larey Dresser, MD;  Location: [place] [facility];  Service: Cardiovascular;  Laterality: N/A;  . TEE WITHOUT CARDIOVERSION N/A 11/13/2018   Procedure: TRANSESOPHAGEAL ECHOCARDIOGRAM (TEE);  Surgeon: Prescott Gum, Collier Salina, MD;  Location: [place];  Service: Open Heart Surgery;  Laterality: N/A;    Current Medications: Current Meds  Medication Sig  . aspirin EC 325 MG EC tablet Take 1 tablet (325 mg total) by mouth daily.  . carvedilol (COREG) 6.25 MG tablet Take 1 tablet (6.25 mg total) by mouth 2 (two) times daily with a meal.  . furosemide (LASIX) 40 MG tablet Take 40 mg by mouth every morning.  Marland Kitchen guaiFENesin (MUCINEX) 600 MG 12 hr tablet Take 1,200 mg by mouth 2 (two) times daily.  . mometasone-formoterol (DULERA) 200-5 MCG/ACT AERO Inhale 2 puffs into the lungs 2 (two) times daily.  . nicotine (NICODERM CQ - DOSED IN MG/24 HOURS) 21 mg/24hr patch Place 21 mg onto the skin daily. Alternate shoulder  . oxyCODONE (OXY IR/ROXICODONE) 5 MG immediate release tablet Take 1 tablet (5 mg total) by mouth every 6 (six) hours as needed for severe pain.  . potassium chloride SA (K-DUR,KLOR-CON) 20 MEQ tablet Take 1 tablet (20 mEq total) by mouth daily.  . rosuvastatin (CRESTOR)  20 MG tablet Take 20 mg by mouth every evening.     Allergies:   Albuterol and Pulmicort [budesonide]   Social History   Socioeconomic History  . Marital status: Married    Spouse name: Not on file  . Number of children: Not on file  . Years of education: Not on file  . Highest education level: Not on file  Occupational History  . Not on file  Social Needs  . Financial resource strain: Not on file  . Food insecurity:    Worry: Not on file    Inability: Not on file  .  Transportation needs:    Medical: Not on file    Non-medical: Not on file  Tobacco Use  . Smoking status: Current Every Day Smoker    Packs/day: 0.50    Years: 30.00    Pack years: 15.00    Types: Cigarettes  . Smokeless tobacco: Never Used  . Tobacco comment: 11/10/2018 "smoke alot less w/patch"  Substance and Sexual Activity  . Alcohol use: Yes    Alcohol/week: 48.0 standard drinks    Types: 48 Cans of beer per week    Comment: have not drank whisky is 2 months - only beer  . Drug use: Never  . Sexual activity: Not Currently  Lifestyle  . Physical activity:    Days per week: Not on file    Minutes per session: Not on file  . Stress: Not on file  Relationships  . Social connections:    Talks on phone: Not on file    Gets together: Not on file    Attends religious service: Not on file    Active member of club or organization: Not on file    Attends meetings of clubs or organizations: Not on file    Relationship status: Not on file  Other Topics Concern  . Not on file  Social History Narrative  . Not on file     Family History:  The patient's   family history includes Alzheimer's disease in his mother; Heart disease in his mother.   ROS:   Please see the history of present illness.    Review of Systems  Constitution: Positive for malaise/fatigue and weight gain.  HENT: Negative.   Cardiovascular: Positive for dyspnea on exertion and leg swelling.  Respiratory: Negative.   Endocrine: Negative.   Hematologic/Lymphatic: Negative.   Musculoskeletal: Negative.   Gastrointestinal: Negative.   Genitourinary: Negative.   Neurological: Negative.    All other systems reviewed and are negative.   PHYSICAL EXAM:   VS:  BP 128/70 (BP Location: Right Arm)   Pulse 68   Ht '5\' 6"'$  (1.676 m)   Wt 196 lb (88.9 kg)   SpO2 98%   BMI 31.64 kg/m   Physical Exam  GEN: Well nourished, well developed, in no acute distress  Neck: Increased JVD, no carotid bruits, or  masses Cardiac:RRR; no murmurs, rubs, or gallops  Respiratory: Decreased breath sounds throughout GI: soft, nontender, nondistended, + BS Ext: +2-3 edema bilaterally without cyanosis, clubbing, Good distal pulses bilaterally Neuro:  Alert and Oriented x 3 Psych: euthymic mood, full affect  Wt Readings from Last 3 Encounters:  12/08/18 196 lb (88.9 kg)  11/19/18 190 lb 11.2 oz (86.5 kg)  08/21/15 181 lb (82.1 kg)      Studies/Labs Reviewed:   EKG:  EKG is ordered today.  The ekg ordered today demonstrates normal sinus rhythm with poor R wave progression anteriorly  Recent Labs:  11/06/2018: B Natriuretic Peptide 989.9 11/11/2018: TSH 2.980 11/14/2018: Magnesium 2.1 11/16/2018: ALT 15 11/17/2018: BUN 20; Creatinine, Ser 0.87; Potassium 4.1; Sodium 133 11/19/2018: Hemoglobin 9.3; Platelets 242   Lipid Panel No results found for: CHOL, TRIG, HDL, CHOLHDL, VLDL, LDLCALC, LDLDIRECT  Additional studies/ records that were reviewed today include:  Left heart catheterization 10/30/18: FINDINGS/RECOMMENDATIONS:  1. Severe fluoroscopic coronary calcification.  2. LMT distal 25%. LAD heavily calcified prox 80% and mid focal 50%. Long D1 has ostial 80% stenosis. Cx prox tandem heavily calcified 80% and 70% stenoses. Long OM2 has prox 40% plaque. Dom RCA prox 99% followed by mid 100%. Mid to distal segments have good collaterals from left.  Gave pt and wife pics and updated referring cardiologist (Dr Donley Redder). Urged to stop smoking.   Echocardiogram 10/28/18: SUMMARY The left ventricle is moderately dilated. There is normal left ventricular wall thickness.  Left ventricular systolic function is severely reduced. LV ejection fraction = 25-30%. Only the basal segments show significant contractile function, except for the  basal inferolateral segment which is also contractile. The mid and apical  segments are severly hypokinetic to akinetic.  Left ventricular filling pattern is  pseudonormal. The right ventricle is mildly dilated. The left atrium is severely dilated. The right atrium is moderately dilated. There is tenting of the mitral valve leaflets. There is moderate mitral regurgitation. There is mild tricuspid regurgitation. Moderate pulmonary hypertension. Estimated right ventricular systolic  pressure is 64 mmHg. Mildly dilated ascending aorta. (3.7cm) There is no pericardial effusion. There is no comparison study available.    CT 12/31/2019IMPRESSION: Advanced left main and 3 vessel coronary artery disease.   Aortic atherosclerosis.  Aortic Atherosclerosis (ICD10-I70.0).   There are several bilateral ground-glass/sub solid nodules, with the largest of the left upper lobe measuring 16 mm. These may be inflammatory/infectious, however, noncontrast CT at 3-6 months is recommended to assure stability/resolution. This recommendation follows the consensus statement: Guidelines for Management of Incidental Pulmonary Nodules Detected on CT Images: From the Fleischner Society 2017; Radiology 2017; 284:228-243.   Cardiomegaly        ASSESSMENT:    1. S/P CABG x 4   2. Ischemic cardiomyopathy   3. Tobacco abuse   4. Pulmonary nodule   5. Acute on chronic systolic congestive heart failure (HCC)      PLAN:  In order of problems listed above:  CAD status post and STEMI with three-vessel CAD on cath at [facility] 10/2018, status post CABG times 4-11/14/18-no angina.  Recommend cardiac rehab.  He said he will think about it.  Ischemic cardiomyopathy ejection fraction 25 to 30% on preop echo-increased edema and 6 pound weight gain.  Will increase Lasix to 40 mg twice daily potassium 20 mEq twice daily for 3 days then back to 40 mg of Lasix and 20 mEq of potassium daily.  Be met and BNP today.  I will see him back in 2 weeks to reassess.  Follow-up with cardiologist in 2 months to become established.  Acute on chronic systolic CHF see above 2 g sodium diet  also given.  Tobacco abuse quit smoking.  Pulmonary nodule on CT needs repeat in 6 months discussed with patient who says he is claustrophobic and has a hard time with CT scans.  Needs to be ordered by PCP.  Medication Adjustments/Labs and Tests Ordered: Current medicines are reviewed at length with the patient today.  Concerns regarding medicines are outlined above.  Medication changes, Labs and Tests ordered today are listed in the  Patient Instructions below. There are no Patient Instructions on file for this visit.   Sumner Boast, PA-C  12/08/2018 2:04 PM    [facility] [place], [place], [place]  [address] Phone: [phone]; Fax: [phone]

## 2018-12-08 ENCOUNTER — Ambulatory Visit (INDEPENDENT_AMBULATORY_CARE_PROVIDER_SITE_OTHER): Payer: Medicare Other | Admitting: Physician Assistant

## 2018-12-08 ENCOUNTER — Encounter: Payer: Self-pay | Admitting: Physician Assistant

## 2018-12-08 ENCOUNTER — Other Ambulatory Visit (HOSPITAL_COMMUNITY)
Admission: RE | Admit: 2018-12-08 | Discharge: 2018-12-08 | Disposition: A | Discharge status: Home / Self Care | Payer: Medicare Other | Source: Ambulatory Visit | Attending: Physician Assistant | Admitting: Physician Assistant

## 2018-12-08 VITALS — BP 128/70 | HR 68 | Ht 66.0 in | Wt 196.0 lb

## 2018-12-08 DIAGNOSIS — I5023 Acute on chronic systolic (congestive) heart failure: Secondary | ICD-10-CM | POA: Diagnosis not present

## 2018-12-08 DIAGNOSIS — R911 Solitary pulmonary nodule: Secondary | ICD-10-CM | POA: Diagnosis not present

## 2018-12-08 DIAGNOSIS — Z951 Presence of aortocoronary bypass graft: Secondary | ICD-10-CM | POA: Insufficient documentation

## 2018-12-08 DIAGNOSIS — I255 Ischemic cardiomyopathy: Secondary | ICD-10-CM | POA: Diagnosis not present

## 2018-12-08 DIAGNOSIS — Z720 Tobacco use: Secondary | ICD-10-CM

## 2018-12-08 LAB — BASIC METABOLIC PANEL
Anion gap: 7 (ref 5–15)
BUN: 13 mg/dL (ref 8–23)
CO2: 25 mmol/L (ref 22–32)
Calcium: 9.2 mg/dL (ref 8.9–10.3)
Chloride: 105 mmol/L (ref 98–111)
Creatinine, Ser: 0.81 mg/dL (ref 0.61–1.24)
GFR calc Af Amer: >60 mL/min (ref >60)
GFR calc non Af Amer: >60 mL/min (ref >60)
Glucose, Bld: 104 mg/dL — ABNORMAL HIGH (ref 70–99)
POTASSIUM: 4.9 mmol/L (ref 3.5–5.1)
Sodium: 137 mmol/L (ref 135–145)

## 2018-12-08 LAB — BRAIN NATRIURETIC PEPTIDE: B Natriuretic Peptide: 1148 pg/mL — ABNORMAL HIGH (ref 0.0–100.0)

## 2018-12-08 MED ORDER — FUROSEMIDE 40 MG PO TABS: ORAL_TABLET | ORAL | 3 refills | Status: DC

## 2018-12-08 MED ORDER — LOSARTAN POTASSIUM 25 MG PO TABS: 25.0000 mg | ORAL_TABLET | Freq: Every day | ORAL | 3 refills | Status: DC

## 2018-12-08 NOTE — Addendum Note (Signed)
Addended by: Marlyn Corporal A on: 12/08/2018 03:26 PM   Modules accepted: Orders

## 2018-12-08 NOTE — Patient Instructions (Signed)
Medication Instructions:  START losartan 25 mg daily  Take Lasix 80 mg daily for 3 days, THEN go back to usual dose of 40 mg daily  Take Potassium 40 meq daily for 3 days, THEN go back to usual dose of 20 meq daily  If you need a refill on your cardiac medications before your next appointment, please call your pharmacy.   Lab work: BMET and BNP TODAY If you have labs (blood work) drawn today and your tests are completely normal, you will receive your results only by: Marland Kitchen. MyChart Message (if you have MyChart) OR . A paper copy in the mail If you have any lab test that is abnormal or we need to change your treatment, we will call you to review the results.  Testing/Procedures: None today  Follow-Up: see Herma CarsonMichelle Lenze in 2 weeks  At [facility], you and your health needs are our priority.  As part of our continuing mission to provide you with exceptional heart care, we have created designated Provider Care Teams.  These Care Teams include your primary Cardiologist (physician) and Advanced Practice Providers (APPs -  Physician Assistants and Nurse Practitioners) who all work together to provide you with the care you need, when you need it. You will need a follow up appointment in 2 months.  Please call our office 2 months in advance to schedule this appointment.  You may see Dr.Branch  or one of the following Advanced Practice Providers on your designated Care Team:   [place]any Strader, PA-C [facility]([place] Office) . Jacolyn ReedyMichele Lenze, PA-C [facility]([place] Office)  Any Other Special Instructions Will Be Listed Below (If Applicable). I have referred you to [facility]. They will call you for an apt.    Follow 2 Gram sodium diet provided to you today.

## 2018-12-09 ENCOUNTER — Telehealth: Payer: Self-pay | Admitting: Physician Assistant

## 2018-12-09 NOTE — Telephone Encounter (Signed)
Returned pt's phone call. Spoke to his wife. She wanted to discuss his labs as she was not home when he spoke with nurse, and he did not relay the numbers very well. Informed her of labs.

## 2018-12-09 NOTE — Telephone Encounter (Signed)
Would like to discuss results of labwork.  tg

## 2018-12-14 ENCOUNTER — Encounter: Payer: Self-pay | Admitting: Physician Assistant

## 2018-12-14 ENCOUNTER — Ambulatory Visit (INDEPENDENT_AMBULATORY_CARE_PROVIDER_SITE_OTHER): Payer: Self-pay | Admitting: Physician Assistant

## 2018-12-14 ENCOUNTER — Other Ambulatory Visit: Payer: Self-pay | Admitting: Cardiothoracic Surgery

## 2018-12-14 ENCOUNTER — Other Ambulatory Visit: Payer: Self-pay

## 2018-12-14 ENCOUNTER — Other Ambulatory Visit: Payer: PRIVATE HEALTH INSURANCE

## 2018-12-14 ENCOUNTER — Ambulatory Visit
Admission: RE | Admit: 2018-12-14 | Discharge: 2018-12-14 | Disposition: A | Discharge status: Home / Self Care | Payer: Medicare Other | Source: Ambulatory Visit | Attending: Cardiothoracic Surgery | Admitting: Cardiothoracic Surgery

## 2018-12-14 VITALS — BP 149/74 | HR 64 | Resp 16 | Ht 66.0 in | Wt 196.0 lb

## 2018-12-14 DIAGNOSIS — Z951 Presence of aortocoronary bypass graft: Secondary | ICD-10-CM

## 2018-12-14 DIAGNOSIS — I2510 Atherosclerotic heart disease of native coronary artery without angina pectoris: Secondary | ICD-10-CM

## 2018-12-14 DIAGNOSIS — R911 Solitary pulmonary nodule: Secondary | ICD-10-CM

## 2018-12-14 DIAGNOSIS — J90 Pleural effusion, not elsewhere classified: Secondary | ICD-10-CM | POA: Diagnosis not present

## 2018-12-14 NOTE — Patient Instructions (Addendum)
You may return to driving an automobile as long as you are no longer requiring oral narcotic pain relievers during the daytime.  It would be wise to start driving only short distances during the daylight and gradually increase from there as you feel comfortable.  You may continue to gradually increase your physical activity as tolerated.  Refrain from any heavy lifting or strenuous use of your arms and shoulders until at least 8 weeks from the time of your surgery, and avoid activities that cause increased pain in your chest on the side of your surgical incision.  Otherwise, you may continue to increase activities without any particular limitations.  Increase the intensity and duration of physical activity gradually.  You are encouraged to enroll and participate in the outpatient cardiac rehab program beginning as soon as practical.  Do not resume smoking cigarettes or any other tobacco products.

## 2018-12-14 NOTE — Progress Notes (Addendum)
HPI:  Patient returns for routine postoperative follow-up having undergone a CABG x 4 by Dr. Donata ClayVan Trigt on 11/13/2018. Since hospital discharge the patient reports he had increased swelling in his legs. He saw the cardiology advanced practitoner on 12/08/2017. BNP and BMET were checked. BNP was elevated at 1148. She had increased his Lasix to bid for a few days and his LE swelling did initially decrease; however, it is increasing again with only daily Lasix. He states he is still fatigued at times. He has occasional shortness of breath if walking for a long time, but everyday activities "not so much". He denies chest pain.   Current Outpatient Medications  Medication Sig Dispense Refill  . aspirin EC 325 MG EC tablet Take 1 tablet (325 mg total) by mouth daily. 30 tablet 0  . carvedilol (COREG) 6.25 MG tablet Take 1 tablet (6.25 mg total) by mouth 2 (two) times daily with a meal. 60 tablet 1  . furosemide (LASIX) 40 MG tablet 12/08/2018 take 80 mg daily for 3 days, then go back to 40 mg daily 90 tablet 3  . guaiFENesin (MUCINEX) 600 MG 12 hr tablet Take 1,200 mg by mouth 2 (two) times daily.    Marland Kitchen. losartan (COZAAR) 25 MG tablet Take 1 tablet (25 mg total) by mouth daily. 90 tablet 3  . mometasone-formoterol (DULERA) 200-5 MCG/ACT AERO Inhale 2 puffs into the lungs 2 (two) times daily. 0.1 g 0  . nicotine (NICODERM CQ - DOSED IN MG/24 HOURS) 21 mg/24hr patch Place 21 mg onto the skin daily. Alternate shoulder    . oxyCODONE (OXY IR/ROXICODONE) 5 MG immediate release tablet Take 1 tablet (5 mg total) by mouth every 6 (six) hours as needed for severe pain. 30 tablet 0  . potassium chloride SA (K-DUR,KLOR-CON) 20 MEQ tablet Take 1 tablet (20 mEq total) by mouth daily. 30 tablet 1  . rosuvastatin (CRESTOR) 20 MG tablet Take 20 mg by mouth every evening.    Vital Signs: BP 149/74, HR 64, RR 16, Oxygenation 99% on room air   Physical Exam: CV-RRR Pulmonary-Diminished left base Extremities-++ LE  pitting like edema Wounds-Clean and dry. Eschar removed from   Diagnostic Tests: CLINICAL DATA:  Patient status post CABG 11/13/2018.  EXAM: CHEST - 2 VIEW  COMPARISON:  PA and lateral chest 11/17/2018.  FINDINGS: Eight intact median sternotomy wires are unchanged. Small bilateral pleural effusions, larger on the left, have decreased since the comparison examination. Subsegmental atelectasis left lung base is noted. No pneumothorax. There is cardiomegaly without edema.  IMPRESSION: Improved small bilateral pleural effusions, larger on the left. No new abnormality.  Cardiomegaly.   Electronically Signed   By: Drusilla Kannerhomas  Dalessio M.D.   On: 12/14/2018 12:14  Impression and Plan: Mr. Christell ConstantMoore is continuing to recover from NSTEMI and coronary artery bypass grafting surgery. Of note, he states he has white coat hypertension and he is very anxious right now. I will not increase Losartan at this time. We will monitor his blood pressure and determine if this needs to be increased. He has an appointment to see the cardiology advanced practitoner on 02/17. I instructed him to take Lasix 40 mg two times when he gets home but no extra potassium as was elevated to 4.9 on 01/28. I will discuss with Dr. Donata ClayVan Trigt on how to proceed with further diuresis (increasing Lasix vs adding Metolazone). Regarding the left pleural effusion, patient is not symptomatic and does not want a thoracentesis at this time. We discussed he  may begin driving short distances as he is not taking narcotics for pain (i.e. 30 minutes or less during the day) and increase the frequency and duration as tolerates. He is to continue with sternal precautions (no lifting more than 10 pounds for the next 4 weeks. He was encouraged to participate in cardiac rehab. We discussed the importance of smoking cessation and he has been smoke free since surgery. Of note, he was found have several bilateral ground glass sub solid nodules LUL on CT of  the chest on 11/10/2018. He will need a follow up CT scan of the chest in about 3 months, as discussed with Dr. Donata Clay the timing of the CT of the chest, diuresis regimen, as well as a return appointment. I will contact patient on 02/04 to inform him of next appointment.   Ardelle Balls, PA-C [facility] [phone]  Addendum after speaking to Dr. Donata Clay: I spoke with patient's wife about the following: 1. Ok to take Primatene mist several times a week but he should see his medical doctor as he is able to get samples of other inhalers. 2. Follow up appointment with Dr. Zenaida Niece Trigt:12/23/2018 at 3:30 pm. PA/LAT CXR to be taken at 3:00 pm on ground floor prior to office appointment 3. Regarding follow up CT scan of chest, Dr. Donata Clay said will arrange in about 3 months. Patient is claustrophobic so may need medicated prior 4. Regarding LE edema, diuretics as follows: Zaroxolyn 5 mg followed by Lasix 40 mg (about 20 minutes later) and a potassium supplement (20 meq) and then Lasix 40 mg in the afternoon only. This regimen is to be done for 3 days only. On Friday 02/07, he is to resume Lasix 40 mg daily with potassium supplement 20 meq daily thereafter. Wife stated he has refills/and or enough Lasix and potassium at this time. I called Zaroxolyn prescription into [facility] at wife's request.

## 2018-12-16 ENCOUNTER — Encounter: Payer: PRIVATE HEALTH INSURANCE | Admitting: Cardiothoracic Surgery

## 2018-12-16 ENCOUNTER — Telehealth: Payer: Self-pay

## 2018-12-16 NOTE — Telephone Encounter (Signed)
Patient's wife contacted the office concerned about how much fluid was pulled off yesterday since starting a 3 day regimen Dr. Donata Clay prescribed at his last appointment on 12/14/2018 (Zaroxlyn, lasix, potassium).  Patient is down 6.2 lbs. Since yesterday.  185 lbs when weighed this morning.  She was concerned about his electrolytes and too much fluid coming off.  I advised her that this is the outcome that is expected and he is to continue for the next 2 days.  I did advise to watch for fatigue, dizziness, or lightheadedness if too much fluid was pulled off, which she acknowledged receipt.  She stated that his lower extremity swelling was much better today and he felt much better this morning.

## 2018-12-22 ENCOUNTER — Other Ambulatory Visit: Payer: Self-pay | Admitting: Cardiothoracic Surgery

## 2018-12-22 DIAGNOSIS — Z951 Presence of aortocoronary bypass graft: Secondary | ICD-10-CM

## 2018-12-22 DIAGNOSIS — I25119 Atherosclerotic heart disease of native coronary artery with unspecified angina pectoris: Secondary | ICD-10-CM

## 2018-12-23 ENCOUNTER — Ambulatory Visit
Admission: RE | Admit: 2018-12-23 | Discharge: 2018-12-23 | Disposition: A | Discharge status: Home / Self Care | Payer: Medicare Other | Source: Ambulatory Visit | Attending: Cardiothoracic Surgery | Admitting: Cardiothoracic Surgery

## 2018-12-23 ENCOUNTER — Ambulatory Visit (INDEPENDENT_AMBULATORY_CARE_PROVIDER_SITE_OTHER): Payer: Self-pay | Admitting: Cardiothoracic Surgery

## 2018-12-23 ENCOUNTER — Encounter: Payer: Self-pay | Admitting: Cardiothoracic Surgery

## 2018-12-23 VITALS — BP 136/67 | HR 61 | Resp 20 | Ht 66.0 in | Wt 188.0 lb

## 2018-12-23 DIAGNOSIS — Z951 Presence of aortocoronary bypass graft: Secondary | ICD-10-CM | POA: Diagnosis not present

## 2018-12-23 DIAGNOSIS — I25119 Atherosclerotic heart disease of native coronary artery with unspecified angina pectoris: Secondary | ICD-10-CM

## 2018-12-23 DIAGNOSIS — R911 Solitary pulmonary nodule: Secondary | ICD-10-CM

## 2018-12-23 NOTE — Progress Notes (Signed)
PCP is Toma Deiters, MD Referring Provider is Chrystie Nose, MD   History of present illness    Patient returns for final postop visit 2 months after urgent CABG x4.  Over the holidays patient presented with non-STEMI, CHF, and was found to have EF of 20% with severe three-vessel CAD.  Echocardiogram showed moderate central MR.  He has fairly significant COPD emphysema and heavy smoking history.  His pulmonary status and LV dysfunction prolonged his recovery but he ultimately was discharged home.  He is now off home oxygen and is ambulating without angina or CHF.  He recently has had escalation of his diuretics for bilateral leg edema and this has worked well.  He is back on Lasix 40 mg a day with a stable weight and without ankle edema today.  He has stopped smoking since before surgery.  Chest x-ray today shows clear lung fields with an elevated hemidiaphragm related to the left IMA harvest.  No pleural effusion.  The patient is driving and is ready to start cardiac rehab.  We will refer him to the [facility] phase 2 cardiac rehab program.   Past Medical History:  Diagnosis Date  . Arthritis    "knees" (11/10/2018)  . COPD (chronic obstructive pulmonary disease) (HCC)   . Hypertension     Past Surgical History:  Procedure Laterality Date  . CATARACT EXTRACTION W/PHACO Left 07/06/2015   Procedure: CATARACT EXTRACTION PHACO AND INTRAOCULAR LENS PLACEMENT (IOC);  Surgeon: Gemma Payor, MD;  Location: [facility];  Service: Ophthalmology;  Laterality: Left;  CDE 8.33  . CATARACT EXTRACTION W/PHACO Right 08/21/2015   Procedure: CATARACT EXTRACTION PHACO AND INTRAOCULAR LENS PLACEMENT (IOC);  Surgeon: Gemma Payor, MD;  Location: [facility];  Service: Ophthalmology;  Laterality: Right;  CDE:9.89  . CORONARY ARTERY BYPASS GRAFT N/A 11/13/2018   Procedure: CORONARY ARTERY BYPASS GRAFTING (CABG), ON PUMP, TIMES FOUR, USING LEFT INTERNAL MAMMARY ARTERY, AND BILATERAL GREATER SAPHENOUS VEIN HARVESTED  ENDOSCOPICALLY;  Surgeon: Kerin Perna, MD;  Location: [facility] OR;  Service: Open Heart Surgery;  Laterality: N/A;  . KNEE ARTHROSCOPY Right 2006  . KNEE ARTHROSCOPY Left 2000  . RIGHT HEART CATH N/A 11/12/2018   Procedure: RIGHT HEART CATH;  Surgeon: Laurey Morale, MD;  Location: [facility] INVASIVE CV LAB;  Service: Cardiovascular;  Laterality: N/A;  . TEE WITHOUT CARDIOVERSION N/A 11/13/2018   Procedure: TRANSESOPHAGEAL ECHOCARDIOGRAM (TEE);  Surgeon: Donata Clay, Theron Arista, MD;  Location: [facility] OR;  Service: Open Heart Surgery;  Laterality: N/A;    Family History  Problem Relation Age of Onset  . Alzheimer's disease Mother   . Heart disease Mother        diagnosed in older age    Social History Social History   Tobacco Use  . Smoking status: Former Smoker    Packs/day: 0.50    Years: 30.00    Pack years: 15.00    Types: Cigarettes  . Smokeless tobacco: Never Used  . Tobacco comment: HAS NOT SMOKED SINCE SURGERY  Substance Use Topics  . Alcohol use: Yes    Alcohol/week: 48.0 standard drinks    Types: 48 Cans of beer per week    Comment: have not drank whisky is 2 months - only beer  . Drug use: Never    Current Outpatient Medications  Medication Sig Dispense Refill  . aspirin EC 325 MG EC tablet Take 1 tablet (325 mg total) by mouth daily. 30 tablet 0  . carvedilol (COREG) 6.25 MG tablet Take  1 tablet (6.25 mg total) by mouth 2 (two) times daily with a meal. 60 tablet 1  . furosemide (LASIX) 40 MG tablet 12/08/2018 take 80 mg daily for 3 days, then go back to 40 mg daily 90 tablet 3  . guaiFENesin (MUCINEX) 600 MG 12 hr tablet Take 1,200 mg by mouth 2 (two) times daily.    Marland Kitchen losartan (COZAAR) 25 MG tablet Take 1 tablet (25 mg total) by mouth daily. 90 tablet 3  . mometasone-formoterol (DULERA) 200-5 MCG/ACT AERO Inhale 2 puffs into the lungs 2 (two) times daily. 0.1 g 0  . nicotine (NICODERM CQ - DOSED IN MG/24 HOURS) 21 mg/24hr patch Place 21 mg onto the skin daily. Alternate shoulder     . potassium chloride SA (K-DUR,KLOR-CON) 20 MEQ tablet Take 1 tablet (20 mEq total) by mouth daily. 30 tablet 1  . rosuvastatin (CRESTOR) 20 MG tablet Take 20 mg by mouth every evening.     No current facility-administered medications for this visit.     Allergies  Allergen Reactions  . Albuterol Nausea And Vomiting and Other (See Comments)    Dizziness - unable to stand  . Pulmicort [Budesonide] Other (See Comments)    Makes very jittery and feels worse rather than better    Review of Systems   Weight down from 192 down to 183 Appetite improved No sternal click or pop No falls at home Not smoking  BP 136/67   Pulse 61   Resp 20   Ht 5\' 6"  (1.676 m)   Wt 188 lb (85.3 kg)   SpO2 98% Comment: RA  BMI 30.34 kg/m  Physical Exam      Exam    General- alert and comfortable    Neck- no JVD, no cervical adenopathy palpable, no carotid bruit   Lungs- clear without rales, wheezes.  Sternal incision well-healed.   Cor- regular rate and rhythm, no murmur , gallop   Abdomen- soft, non-tender   Extremities - warm, non-tender, minimal edema   Neuro- oriented, appropriate, no focal weakness  Diagnostic Tests: Chest x-ray images personally reviewed, results as noted above  Impression:  Patient doing extremely well aftert urgent CABG for ischemic cardiomyopathy, EF 20%, COPD Plan: Patient understands importance of maintaining heart healthy lifestyle for the long term- heart healthy diet and 30 minutes of walking type activity 5 days a week.  Hopefully he will complete the phase 2 cardiac rehab program at [facility] which has been recommended.  Patient will return as needed for any future surgical issues.  Mikey Bussing, MD [facility] [phone]

## 2018-12-23 NOTE — Progress Notes (Signed)
Cardiology Office Note    Date:  12/28/2018   ID:  BRAILEN MACNEAL, DOB 11/04/1946, MRN [id number]  PCP:  Neale Burly, MD  Cardiologist: Pixie Casino, MD EPS: None  Chief Complaint  Patient presents with  . Follow-up    History of Present Illness:  David Mack is a 74 y.o. male with history of ischemic cardiomyopathy diagnosed at [facility] 10/2018.  He had an NSTEMI with left heart cath revealing three-vessel CAD TEE showed moderate MR severe LV dysfunction EF 20 to 25% he was referred for surgical revascularization.  He came to the emergency room with acute shortness of breath and severe hyponatremia and underwent CABG times 4 LIMA to the LAD, SVG to the ramus intermediate, SVG to OM, SVG to PDA 11/14/18.  Also has history of tobacco abuse and incidental finding of nodule on chest CT will need repeat in 6 months.   I saw the patient 12/08/18 and had increase leg swelling since he left the hospital and had gained 6 pounds BNP was 1148.  I increased his Lasix for 3 days and it did not help.  He saw the surgeons back and they added Zaroxolyn 5 mg followed by Lasix 40 mg twice daily for 3 days.  His weight went down to 185 and he has been continued on Lasix 40 mg once daily.  Weight is back up to 193 pounds as well as the swelling in his legs.  Chest x-ray showed left lung atelectasis and elevated left hemidiaphragm.           Past Medical History:  Diagnosis Date  . Arthritis    "knees" (11/10/2018)  . COPD (chronic obstructive pulmonary disease) ([place])   . Hypertension     Past Surgical History:  Procedure Laterality Date  . CATARACT EXTRACTION W/PHACO Left 07/06/2015   Procedure: CATARACT EXTRACTION PHACO AND INTRAOCULAR LENS PLACEMENT (IOC);  Surgeon: Tonny Branch, MD;  Location: [facility];  Service: Ophthalmology;  Laterality: Left;  CDE 8.33  . CATARACT EXTRACTION W/PHACO Right 08/21/2015   Procedure: CATARACT EXTRACTION PHACO AND INTRAOCULAR LENS PLACEMENT ([place]);   Surgeon: Tonny Branch, MD;  Location: [facility];  Service: Ophthalmology;  Laterality: Right;  CDE:9.89  . CORONARY ARTERY BYPASS GRAFT N/A 11/13/2018   Procedure: CORONARY ARTERY BYPASS GRAFTING (CABG), ON PUMP, TIMES FOUR, USING LEFT INTERNAL MAMMARY ARTERY, AND BILATERAL GREATER SAPHENOUS VEIN HARVESTED ENDOSCOPICALLY;  Surgeon: Ivin Poot, MD;  Location: [facility];  Service: Open Heart Surgery;  Laterality: N/A;  . CORONARY ARTERY BYPASS GRAFT  11/13/2018   4 vessell  . KNEE ARTHROSCOPY Right 2006  . KNEE ARTHROSCOPY Left 2000  . RIGHT HEART CATH N/A 11/12/2018   Procedure: RIGHT HEART CATH;  Surgeon: Larey Dresser, MD;  Location: [place] CV LAB;  Service: Cardiovascular;  Laterality: N/A;  . TEE WITHOUT CARDIOVERSION N/A 11/13/2018   Procedure: TRANSESOPHAGEAL ECHOCARDIOGRAM (TEE);  Surgeon: Prescott Gum, Collier Salina, MD;  Location: [place];  Service: Open Heart Surgery;  Laterality: N/A;    Current Medications: Current Meds  Medication Sig  . aspirin EC 325 MG EC tablet Take 1 tablet (325 mg total) by mouth daily.  . carvedilol (COREG) 6.25 MG tablet Take 1 tablet (6.25 mg total) by mouth 2 (two) times daily with a meal.  . furosemide (LASIX) 40 MG tablet 12/08/2018 take 80 mg daily for 3 days, then go back to 40 mg daily  . guaiFENesin (MUCINEX) 600 MG 12 hr tablet Take 1,200  mg by mouth 2 (two) times daily.  Marland Kitchen losartan (COZAAR) 25 MG tablet Take 1 tablet (25 mg total) by mouth daily.  . mometasone-formoterol (DULERA) 200-5 MCG/ACT AERO Inhale 2 puffs into the lungs 2 (two) times daily.  . potassium chloride SA (K-DUR,KLOR-CON) 20 MEQ tablet Take 1 tablet (20 mEq total) by mouth daily.  . rosuvastatin (CRESTOR) 20 MG tablet Take 20 mg by mouth every evening.  . [DISCONTINUED] nicotine (NICODERM CQ - DOSED IN MG/24 HOURS) 21 mg/24hr patch Place 21 mg onto the skin daily. Alternate shoulder     Allergies:   Albuterol and Pulmicort [budesonide]   Social History   Socioeconomic History  .  Marital status: Married    Spouse name: Not on file  . Number of children: Not on file  . Years of education: Not on file  . Highest education level: Not on file  Occupational History  . Not on file  Social Needs  . Financial resource strain: Not on file  . Food insecurity:    Worry: Not on file    Inability: Not on file  . Transportation needs:    Medical: Not on file    Non-medical: Not on file  Tobacco Use  . Smoking status: Former Smoker    Packs/day: 0.50    Years: 30.00    Pack years: 15.00    Types: Cigarettes  . Smokeless tobacco: Never Used  . Tobacco comment: HAS NOT SMOKED SINCE SURGERY  Substance and Sexual Activity  . Alcohol use: Yes    Alcohol/week: 48.0 standard drinks    Types: 48 Cans of beer per week    Comment: have not drank whisky is 2 months - only beer  . Drug use: Never  . Sexual activity: Not Currently  Lifestyle  . Physical activity:    Days per week: Not on file    Minutes per session: Not on file  . Stress: Not on file  Relationships  . Social connections:    Talks on phone: Not on file    Gets together: Not on file    Attends religious service: Not on file    Active member of club or organization: Not on file    Attends meetings of clubs or organizations: Not on file    Relationship status: Not on file  Other Topics Concern  . Not on file  Social History Narrative  . Not on file     Family History:  The patient's family history includes Alzheimer's disease in his mother; Heart disease in his mother.   ROS:   Please see the history of present illness.    Review of Systems  Constitution: Positive for weight gain.  HENT: Negative.   Cardiovascular: Positive for dyspnea on exertion and leg swelling.  Respiratory: Negative.   Endocrine: Negative.   Hematologic/Lymphatic: Negative.   Musculoskeletal: Negative.   Gastrointestinal: Negative.   Genitourinary: Negative.   Neurological: Negative.    All other systems reviewed and are  negative.   PHYSICAL EXAM:   VS:  BP 128/64   Pulse (!) 58   Ht '5\' 6"'$  (1.676 m)   Wt 193 lb (87.5 kg)   SpO2 98%   BMI 31.15 kg/m   Physical Exam  GEN: Well nourished, well developed, in no acute distress  Neck: Slight increase JVD, no carotid bruits, or masses Cardiac:RRR; no murmurs, rubs, or gallops  Respiratory: Decreased breath sounds at the left lung base GI: soft, nontender, nondistended, + BS Ext: +2  edema bilaterally without cyanosis, clubbing Good distal pulses bilaterally Neuro:  Alert and Oriented x 3 Psych: euthymic mood, full affect  Wt Readings from Last 3 Encounters:  12/28/18 193 lb (87.5 kg)  12/23/18 188 lb (85.3 kg)  12/14/18 196 lb (88.9 kg)      Studies/Labs Reviewed:   EKG:  EKG is not ordered today.   Recent Labs: 11/11/2018: TSH 2.980 11/14/2018: Magnesium 2.1 11/16/2018: ALT 15 11/19/2018: Hemoglobin 9.3; Platelets 242 12/08/2018: B Natriuretic Peptide 1,148.0; BUN 13; Creatinine, Ser 0.81; Potassium 4.9; Sodium 137   Lipid Panel No results found for: CHOL, TRIG, HDL, CHOLHDL, VLDL, LDLCALC, LDLDIRECT  Additional studies/ records that were reviewed today include:  Left heart catheterization 10/30/18: FINDINGS/RECOMMENDATIONS:  1. Severe fluoroscopic coronary calcification.  2. LMT distal 25%. LAD heavily calcified prox 80% and mid focal 50%. Long D1 has ostial 80% stenosis. Cx prox tandem heavily calcified 80% and 70% stenoses. Long OM2 has prox 40% plaque. Dom RCA prox 99% followed by mid 100%. Mid to distal segments have good collaterals from left.  Gave pt and wife pics and updated referring cardiologist (Dr Donley Redder). Urged to stop smoking.   Echocardiogram 10/28/18: SUMMARY The left ventricle is moderately dilated. There is normal left ventricular wall thickness.  Left ventricular systolic function is severely reduced. LV ejection fraction = 25-30%. Only the basal segments show significant contractile function, except for the  basal  inferolateral segment which is also contractile. The mid and apical  segments are severly hypokinetic to akinetic.  Left ventricular filling pattern is pseudonormal. The right ventricle is mildly dilated. The left atrium is severely dilated. The right atrium is moderately dilated. There is tenting of the mitral valve leaflets. There is moderate mitral regurgitation. There is mild tricuspid regurgitation. Moderate pulmonary hypertension. Estimated right ventricular systolic  pressure is 64 mmHg. Mildly dilated ascending aorta. (3.7cm) There is no pericardial effusion. There is no comparison study available.     CT 12/31/2019IMPRESSION: Advanced left main and 3 vessel coronary artery disease.   Aortic atherosclerosis.  Aortic Atherosclerosis (ICD10-I70.0).   There are several bilateral ground-glass/sub solid nodules, with the largest of the left upper lobe measuring 16 mm. These may be inflammatory/infectious, however, noncontrast CT at 3-6 months is recommended to assure stability/resolution. This recommendation follows the consensus statement: Guidelines for Management of Incidental Pulmonary Nodules Detected on CT Images: From the Fleischner Society 2017; Radiology 2017; 284:228-243.       ASSESSMENT:    1. Acute systolic CHF (congestive heart failure) ([place])   2. Coronary artery disease involving native coronary artery of native heart without angina pectoris   3. Ischemic cardiomyopathy   4. Medication management   5. Pulmonary nodule      PLAN:  In order of problems listed above:  Acute onChronic systolic CHF BNP was 8882 creatinine 0.81 12/08/2018.  Patient did not respond to increase Lasix 40 mg twice daily and was started on Zaroxolyn 5 mg for 3 days and lost down to 185 pounds.  Patient's weight is back up to 193 pounds.  He is following a low-salt diet.  Will call in Zaroxolyn 2.5 mg and have him take 1 tomorrow morning.  I have asked him to call us and let us know  how he responds to this.  Continue Lasix 40 mg once daily for now.  Will adjust as needed based on labs and his output.  Follow-up next week.  Check be met and BNP today.   CAD status post STEMI  found to have three-vessel CAD at cath at [facility] 10/2018 status post CABG times 4-11/14/18 doing well without angina.  Ischemic cardiomyopathy ejection fraction 25 to 30% preop echo.  Patient continues to struggle with fluid overload.  Adjusting medications today.  Pulmonary nodule on CT will need CT in 6 months.  He is claustrophobic.  This will need to be followed by PCP.   Medication Adjustments/Labs and Tests Ordered: Current medicines are reviewed at length with the patient today.  Concerns regarding medicines are outlined above.  Medication changes, Labs and Tests ordered today are listed in the Patient Instructions below. Patient Instructions  Medication Instructions:  Your physician has recommended you make the following change in your medication:  Take Metolazone 2.5 mg Tomorrow call on Wednesday with weight   If you need a refill on your cardiac medications before your next appointment, please call your pharmacy.   Lab work: Your physician recommends that you return for lab work in: Today   If you have labs (blood work) drawn today and your tests are completely normal, you will receive your results only by: Marland Kitchen MyChart Message (if you have MyChart) OR . A paper copy in the mail If you have any lab test that is abnormal or we need to change your treatment, we will call you to review the results.  Testing/Procedures: NONE   Follow-Up: At [facility], you and your health needs are our priority.  As part of our continuing mission to provide you with exceptional heart care, we have created designated Provider Care Teams.  These Care Teams include your primary Cardiologist (physician) and Advanced Practice Providers (APPs -  Physician Assistants and Nurse Practitioners) who all work  together to provide you with the care you need, when you need it. You will need a follow up appointment in 1 weeks.  Please call our office 2 months in advance to schedule this appointment.  You may see Pixie Casino, MD or one of the following Advanced Practice Providers on your designated Care Team:   Bernerd Pho, PA-C [facility]) . Ermalinda Barrios, PA-C ([place])  Any Other Special Instructions Will Be Listed Below (If Applicable). Thank you for choosing [facility]!        David Boast, PA-C  12/28/2018 2:29 PM    [facility] [place], [place],   [address] Phone: [phone]; Fax: [phone]

## 2018-12-24 ENCOUNTER — Other Ambulatory Visit: Payer: Self-pay

## 2018-12-28 ENCOUNTER — Telehealth: Payer: Self-pay | Admitting: *Deleted

## 2018-12-28 ENCOUNTER — Encounter: Payer: Self-pay | Admitting: Physician Assistant

## 2018-12-28 ENCOUNTER — Ambulatory Visit (INDEPENDENT_AMBULATORY_CARE_PROVIDER_SITE_OTHER): Payer: Medicare Other | Admitting: Physician Assistant

## 2018-12-28 ENCOUNTER — Other Ambulatory Visit (HOSPITAL_COMMUNITY)
Admission: RE | Admit: 2018-12-28 | Discharge: 2018-12-28 | Disposition: A | Discharge status: Home / Self Care | Payer: Medicare Other | Source: Ambulatory Visit | Attending: Physician Assistant | Admitting: Physician Assistant

## 2018-12-28 VITALS — BP 128/64 | HR 58 | Ht 66.0 in | Wt 193.0 lb

## 2018-12-28 DIAGNOSIS — I2510 Atherosclerotic heart disease of native coronary artery without angina pectoris: Secondary | ICD-10-CM

## 2018-12-28 DIAGNOSIS — I255 Ischemic cardiomyopathy: Secondary | ICD-10-CM | POA: Diagnosis not present

## 2018-12-28 DIAGNOSIS — I5021 Acute systolic (congestive) heart failure: Secondary | ICD-10-CM | POA: Diagnosis not present

## 2018-12-28 DIAGNOSIS — R911 Solitary pulmonary nodule: Secondary | ICD-10-CM

## 2018-12-28 DIAGNOSIS — Z79899 Other long term (current) drug therapy: Secondary | ICD-10-CM | POA: Insufficient documentation

## 2018-12-28 LAB — BASIC METABOLIC PANEL
Anion gap: 8 (ref 5–15)
BUN: 12 mg/dL (ref 8–23)
CO2: 25 mmol/L (ref 22–32)
CREATININE: 0.81 mg/dL (ref 0.61–1.24)
Calcium: 9.2 mg/dL (ref 8.9–10.3)
Chloride: 100 mmol/L (ref 98–111)
GFR calc Af Amer: >60 mL/min (ref >60)
GFR calc non Af Amer: >60 mL/min (ref >60)
GLUCOSE: 104 mg/dL — AB (ref 70–99)
Potassium: 4.8 mmol/L (ref 3.5–5.1)
Sodium: 133 mmol/L — ABNORMAL LOW (ref 135–145)

## 2018-12-28 LAB — BRAIN NATRIURETIC PEPTIDE: B Natriuretic Peptide: 661 pg/mL — ABNORMAL HIGH (ref 0.0–100.0)

## 2018-12-28 MED ORDER — METOLAZONE 2.5 MG PO TABS: 2.5000 mg | ORAL_TABLET | Freq: Every day | ORAL | 3 refills | Status: AC | PRN

## 2018-12-28 NOTE — Telephone Encounter (Signed)
-----   Message from Dyann Kief, [place] sent at 12/28/2018  3:40 PM EST ----- Heart failure marker has come down to 661 but it still elevated.  Continue with the plan to take Zaroxolyn 2.5 mg in addition to current dose Lasix 40 mg once daily.  Only take 1 extra potassium when you take the Zaroxolyn.  Call and let us know how you respond to the Zaroxolyn.

## 2018-12-28 NOTE — Telephone Encounter (Signed)
Called patient with test results. No answer. Left message to call back.  

## 2018-12-28 NOTE — Patient Instructions (Signed)
Medication Instructions:  Your physician has recommended you make the following change in your medication:  Take Metolazone 2.5 mg Tomorrow call on Wednesday with weight   If you need a refill on your cardiac medications before your next appointment, please call your pharmacy.   Lab work: Your physician recommends that you return for lab work in: Today   If you have labs (blood work) drawn today and your tests are completely normal, you will receive your results only by: Marland Kitchen MyChart Message (if you have MyChart) OR . A paper copy in the mail If you have any lab test that is abnormal or we need to change your treatment, we will call you to review the results.  Testing/Procedures: NONE   Follow-Up: At [facility], you and your health needs are our priority.  As part of our continuing mission to provide you with exceptional heart care, we have created designated Provider Care Teams.  These Care Teams include your primary Cardiologist (physician) and Advanced Practice Providers (APPs -  Physician Assistants and Nurse Practitioners) who all work together to provide you with the care you need, when you need it. You will need a follow up appointment in 1 weeks.  Please call our office 2 months in advance to schedule this appointment.  You may see Chrystie Nose, MD or one of the following Advanced Practice Providers on your designated Care Team:   Randall An, PA-C [facility]) . Jacolyn Reedy, PA-C [facility])  Any Other Special Instructions Will Be Listed Below (If Applicable). Thank you for choosing [facility]!

## 2018-12-30 ENCOUNTER — Telehealth: Payer: Self-pay | Admitting: Physician Assistant

## 2018-12-30 NOTE — Telephone Encounter (Signed)
Called pt's wife. No answer. Left detailed message (per Candace)

## 2018-12-30 NOTE — Telephone Encounter (Signed)
Returned call to pt's wife. She states that pt's weight on 12/29/2018 was 188.6 lbs , and this morning 12/30/2018 it was 185.4 lbs. His swelling is minimal and only in feet. He did use the bathroom a little extra yesterday. Please advise on any changes.

## 2018-12-30 NOTE — Telephone Encounter (Signed)
Please let patient know he does not have to take any more Zaroxolyn.  Continue to weigh himself daily and follow low-sodium diet.  If he gains 2 or 3 pounds overnight he can take an extra Zaroxolyn 2.5 mg before he is seen next week.

## 2018-12-30 NOTE — Telephone Encounter (Signed)
Please give pt's wife Candace a call concerning pt's med changes.

## 2019-01-04 ENCOUNTER — Other Ambulatory Visit (HOSPITAL_COMMUNITY)
Admission: RE | Admit: 2019-01-04 | Discharge: 2019-01-04 | Disposition: A | Discharge status: Home / Self Care | Payer: Medicare Other | Source: Ambulatory Visit | Attending: Physician Assistant | Admitting: Physician Assistant

## 2019-01-04 ENCOUNTER — Ambulatory Visit (INDEPENDENT_AMBULATORY_CARE_PROVIDER_SITE_OTHER): Payer: Medicare Other | Admitting: Physician Assistant

## 2019-01-04 ENCOUNTER — Encounter: Payer: Self-pay | Admitting: Physician Assistant

## 2019-01-04 ENCOUNTER — Ambulatory Visit: Payer: Medicare Other | Admitting: Physician Assistant

## 2019-01-04 VITALS — BP 130/72 | HR 62 | Ht 66.0 in | Wt 190.0 lb

## 2019-01-04 DIAGNOSIS — I255 Ischemic cardiomyopathy: Secondary | ICD-10-CM | POA: Diagnosis not present

## 2019-01-04 DIAGNOSIS — I5021 Acute systolic (congestive) heart failure: Secondary | ICD-10-CM | POA: Diagnosis not present

## 2019-01-04 DIAGNOSIS — I5023 Acute on chronic systolic (congestive) heart failure: Secondary | ICD-10-CM

## 2019-01-04 DIAGNOSIS — I2510 Atherosclerotic heart disease of native coronary artery without angina pectoris: Secondary | ICD-10-CM

## 2019-01-04 LAB — BASIC METABOLIC PANEL
Anion gap: 8 (ref 5–15)
BUN: 12 mg/dL (ref 8–23)
CHLORIDE: 99 mmol/L (ref 98–111)
CO2: 25 mmol/L (ref 22–32)
Calcium: 9 mg/dL (ref 8.9–10.3)
Creatinine, Ser: 0.86 mg/dL (ref 0.61–1.24)
GFR calc Af Amer: >60 mL/min (ref >60)
GFR calc non Af Amer: >60 mL/min (ref >60)
Glucose, Bld: 100 mg/dL — ABNORMAL HIGH (ref 70–99)
Potassium: 4.2 mmol/L (ref 3.5–5.1)
Sodium: 132 mmol/L — ABNORMAL LOW (ref 135–145)

## 2019-01-04 LAB — BRAIN NATRIURETIC PEPTIDE: B Natriuretic Peptide: 316 pg/mL — ABNORMAL HIGH (ref 0.0–100.0)

## 2019-01-04 NOTE — Progress Notes (Signed)
Cardiology Office Note    Date:  01/04/2019   ID:  David Mack, David Mack Jul 03, 1946, MRN [id number]  PCP:  Toma Deiters, MD  Cardiologist: Chrystie Nose, MD EPS: None  Chief Complaint  Patient presents with  . Follow-up    History of Present Illness:  David Mack David Mack is a 73 y.o. male  with history of ischemic cardiomyopathy diagnosed at [facility] 10/2018.  He had an NSTEMI with left heart cath revealing three-vessel CAD TEE showed moderate MR severe LV dysfunction EF 20 to 25% he was referred for surgical revascularization.  He came to the emergency room with acute shortness of breath and severe hyponatremia and underwent CABG times 4 LIMA to the LAD, SVG to the ramus intermediate, SVG to OM, SVG to PDA 11/14/18.  Also has history of tobacco abuse and incidental finding of nodule on chest CT will need repeat in 6 months.   I saw the patient in the office 12/28/18 with continued CHF. Surgeons had placed him on Zaroxylyn 5 mg and increased Lasix with weight down to 185 but when I saw him weight back up to 193 lbs. I added Zarololyn 2.5 mg x1 and continued Lasix 40 mg daily. Patient lost down to 185 lbs and I told them not to take anymore Z. BNP was 661 Crt 0.8.  Patient comes in today accompanied by his wife. His weight has been stable at home after one dose of Zaroxolyn 2.5 mg daily.Breathing much better. Still has some swelling. Many questions answered.     Past Medical History:  Diagnosis Date  . Arthritis    "knees" (11/10/2018)  . COPD (chronic obstructive pulmonary disease) (HCC)   . Hypertension     Past Surgical History:  Procedure Laterality Date  . CATARACT EXTRACTION W/PHACO Left 07/06/2015   Procedure: CATARACT EXTRACTION PHACO AND INTRAOCULAR LENS PLACEMENT (IOC);  Surgeon: Gemma Payor, MD;  Location: [facility];  Service: Ophthalmology;  Laterality: Left;  CDE 8.33  . CATARACT EXTRACTION W/PHACO Right 08/21/2015   Procedure: CATARACT EXTRACTION PHACO AND  INTRAOCULAR LENS PLACEMENT (IOC);  Surgeon: Gemma Payor, MD;  Location: [facility];  Service: Ophthalmology;  Laterality: Right;  CDE:9.89  . CORONARY ARTERY BYPASS GRAFT N/A 11/13/2018   Procedure: CORONARY ARTERY BYPASS GRAFTING (CABG), ON PUMP, TIMES FOUR, USING LEFT INTERNAL MAMMARY ARTERY, AND BILATERAL GREATER SAPHENOUS VEIN HARVESTED ENDOSCOPICALLY;  Surgeon: Kerin Perna, MD;  Location: [facility] OR;  Service: Open Heart Surgery;  Laterality: N/A;  . CORONARY ARTERY BYPASS GRAFT  11/13/2018   4 vessell  . KNEE ARTHROSCOPY Right 2006  . KNEE ARTHROSCOPY Left 2000  . RIGHT HEART CATH N/A 11/12/2018   Procedure: RIGHT HEART CATH;  Surgeon: Laurey Morale, MD;  Location: [facility] INVASIVE CV LAB;  Service: Cardiovascular;  Laterality: N/A;  . TEE WITHOUT CARDIOVERSION N/A 11/13/2018   Procedure: TRANSESOPHAGEAL ECHOCARDIOGRAM (TEE);  Surgeon: Donata Clay, Theron Arista, MD;  Location: [facility] OR;  Service: Open Heart Surgery;  Laterality: N/A;    Current Medications: No outpatient medications have been marked as taking for the 01/04/19 encounter (Office Visit) with Dyann Kief, PA-C.     Allergies:   Albuterol and Pulmicort [budesonide]   Social History   Socioeconomic History  . Marital status: Married    Spouse name: Not on file  . Number of children: Not on file  . Years of education: Not on file  . Highest education level: Not on file  Occupational History  . Not on  file  Social Needs  . Financial resource strain: Not on file  . Food insecurity:    Worry: Not on file    Inability: Not on file  . Transportation needs:    Medical: Not on file    Non-medical: Not on file  Tobacco Use  . Smoking status: Former Smoker    Packs/day: 0.50    Years: 30.00    Pack years: 15.00    Types: Cigarettes  . Smokeless tobacco: Never Used  . Tobacco comment: HAS NOT SMOKED SINCE SURGERY  Substance and Sexual Activity  . Alcohol use: Not Currently    Alcohol/week: 48.0 standard drinks    Types: 48 Cans of  beer per week    Comment: have not drank whisky is 2 months - only beer  . Drug use: Never  . Sexual activity: Not Currently  Lifestyle  . Physical activity:    Days per week: Not on file    Minutes per session: Not on file  . Stress: Not on file  Relationships  . Social connections:    Talks on phone: Not on file    Gets together: Not on file    Attends religious service: Not on file    Active member of club or organization: Not on file    Attends meetings of clubs or organizations: Not on file    Relationship status: Not on file  Other Topics Concern  . Not on file  Social History Narrative  . Not on file     Family History:  The patient's family history includes Alzheimer's disease in his mother; Heart disease in his mother.   ROS:   Please see the history of present illness.    Review of Systems  Constitution: Positive for malaise/fatigue.  HENT: Negative.   Cardiovascular: Positive for leg swelling.  Respiratory: Negative.   Endocrine: Negative.   Hematologic/Lymphatic: Negative.   Musculoskeletal: Negative.   Gastrointestinal: Negative.   Genitourinary: Negative.   Neurological: Negative.    All other systems reviewed and are negative.   PHYSICAL EXAM:   VS:  BP 130/72   Pulse 62   Ht 5\' 6"  (1.676 m)   Wt 190 lb (86.2 kg)   SpO2 99%   BMI 30.67 kg/m   Physical Exam  GEN: Well nourished, well developed, in no acute distress  Neck: no JVD, carotid bruits, or masses Cardiac:RRR; no murmurs, rubs, or gallops  Respiratory: Decreased breath sounds throughout  GI: soft, nontender, nondistended, + BS Ext: +1 edema bilaterally without cyanosis, clubbing,, Good distal pulses bilaterally Neuro:  Alert and Oriented x 3 Psych: euthymic mood, full affect  Wt Readings from Last 3 Encounters:  01/04/19 190 lb (86.2 kg)  01/04/19 190 lb (86.2 kg)  12/28/18 193 lb (87.5 kg)      Studies/Labs Reviewed:   EKG:  EKG is not ordered today.  Recent Labs: 11/11/2018:  TSH 2.980 11/14/2018: Magnesium 2.1 11/16/2018: ALT 15 11/19/2018: Hemoglobin 9.3; Platelets 242 12/28/2018: B Natriuretic Peptide 661.0; BUN 12; Creatinine, Ser 0.81; Potassium 4.8; Sodium 133   Lipid Panel No results found for: CHOL, TRIG, HDL, CHOLHDL, VLDL, LDLCALC, LDLDIRECT  Additional studies/ records that were reviewed today include:  Left heart catheterization 10/30/18: FINDINGS/RECOMMENDATIONS:  1. Severe fluoroscopic coronary calcification.  2. LMT distal 25%. LAD heavily calcified prox 80% and mid focal 50%. Long D1 has ostial 80% stenosis. Cx prox tandem heavily calcified 80% and 70% stenoses. Long OM2 has prox 40% plaque. Dom RCA prox 99%  followed by mid 100%. Mid to distal segments have good collaterals from left.  Gave pt and wife pics and updated referring cardiologist (Dr Faylene Million). Urged to stop smoking.   Echocardiogram 10/28/18: SUMMARY The left ventricle is moderately dilated. There is normal left ventricular wall thickness.  Left ventricular systolic function is severely reduced. LV ejection fraction = 25-30%. Only the basal segments show significant contractile function, except for the  basal inferolateral segment which is also contractile. The mid and apical  segments are severly hypokinetic to akinetic.  Left ventricular filling pattern is pseudonormal. The right ventricle is mildly dilated. The left atrium is severely dilated. The right atrium is moderately dilated. There is tenting of the mitral valve leaflets. There is moderate mitral regurgitation. There is mild tricuspid regurgitation. Moderate pulmonary hypertension. Estimated right ventricular systolic  pressure is 64 mmHg. Mildly dilated ascending aorta. (3.7cm) There is no pericardial effusion. There is no comparison study available.     CT 12/31/2019IMPRESSION: Advanced left main and 3 vessel coronary artery disease.   Aortic atherosclerosis.  Aortic Atherosclerosis (ICD10-I70.0).   There  are several bilateral ground-glass/sub solid nodules, with the largest of the left upper lobe measuring 16 mm. These may be inflammatory/infectious, however, noncontrast CT at 3-6 months is recommended to assure stability/resolution. This recommendation follows the consensus statement: Guidelines for Management of Incidental Pulmonary Nodules Detected on CT Images: From the Fleischner Society 2017; Radiology 2017; 284:228-243.         ASSESSMENT:    1. Acute on chronic systolic congestive heart failure (HCC)   2. Coronary artery disease involving native coronary artery of native heart without angina pectoris   3. Ischemic cardiomyopathy      PLAN:  In order of problems listed above:  Acute on chronic systolic CHF much improved from last week.  Weights on his scales are stable.  He is down 3 pounds on our scales.  He still has some edema.  Recommend Zaroxolyn 2.5 mg x 1 tomorrow in addition to an extra potassium.  After that going forward he can take an extra Lasix 40 mg as needed for weight gain of 2 or 3 pounds overnight or 5 pounds in a week.  Also an extra potassium if he takes an extra Lasix.  Keep follow-up with Dr. Wyline Mood in 1 month.  Call if he has any further concerns.  Repeat labs today.  CAD status post STEMI found to have three-vessel CAD at cath at [facility] 10/2018, CABG x 4-11/14/18 by Dr. Maren Beach .doing well without angina  Ischemic cardiomyopathy ejection fraction 25 to 30% preop echo   Pulmonary nodule on CT needs follow-up CT in 6 months    Medication Adjustments/Labs and Tests Ordered: Current medicines are reviewed at length with the patient today.  Concerns regarding medicines are outlined above.  Medication changes, Labs and Tests ordered today are listed in the Patient Instructions below. There are no Patient Instructions on file for this visit.   Elson Clan, PA-C  01/04/2019 3:07 PM    [facility] [address] [place],  [place], [place]  [address] Phone: [phone]; Fax: [phone]

## 2019-01-04 NOTE — Patient Instructions (Addendum)
Medication Instructions:  Your physician recommends that you continue on your current medications as directed. Please refer to the Current Medication list given to you today.  Take Zaroxolyn 2.5 mg on tomorrow.   You may take an extra Lasix and Potassium for weight gain of 2-3 pounds over night or 5 pounds in one week.   If you need a refill on your cardiac medications before your next appointment, please call your pharmacy.   Lab work: Your physician recommends that you return for lab work in: Today   If you have labs (blood work) drawn today and your tests are completely normal, you will receive your results only by: Marland Kitchen MyChart Message (if you have MyChart) OR . A paper copy in the mail If you have any lab test that is abnormal or we need to change your treatment, we will call you to review the results.  Testing/Procedures: NONE   Follow-Up: At [facility], you and your health needs are our priority.  As part of our continuing mission to provide you with exceptional heart care, we have created designated Provider Care Teams.  These Care Teams include your primary Cardiologist (physician) and Advanced Practice Providers (APPs -  Physician Assistants and Nurse Practitioners) who all work together to provide you with the care you need, when you need it. You will need a follow up appointment as planned. Please call our office 2 months in advance to schedule this appointment.  You may see Chrystie Nose, MD or one of the following Advanced Practice Providers on your designated Care Team:   Randall An, PA-C [facility]) . Jacolyn Reedy, PA-C [facility] Office)  Any Other Special Instructions Will Be Listed Below (If Applicable). Thank you for choosing [facility]!

## 2019-01-06 ENCOUNTER — Other Ambulatory Visit: Payer: Self-pay | Admitting: *Deleted

## 2019-01-06 ENCOUNTER — Telehealth: Payer: Self-pay | Admitting: *Deleted

## 2019-01-06 MED ORDER — POTASSIUM CHLORIDE CRYS ER 20 MEQ PO TBCR: 20.0000 meq | EXTENDED_RELEASE_TABLET | Freq: Every day | ORAL | 11 refills | Status: DC

## 2019-01-06 MED ORDER — CARVEDILOL 6.25 MG PO TABS: 6.2500 mg | ORAL_TABLET | Freq: Two times a day (BID) | ORAL | 11 refills | Status: DC

## 2019-01-06 NOTE — Telephone Encounter (Signed)
Called patient with test results. No answer. Left message to call back.  

## 2019-01-06 NOTE — Telephone Encounter (Signed)
-----   Message from Dyann Kief, PA-C sent at 01/05/2019  7:49 AM EST ----- Kidney function remained stable.  Heart failure marker down to 316.  Continue with same plan.  No changes.

## 2019-01-13 DIAGNOSIS — J449 Chronic obstructive pulmonary disease, unspecified: Secondary | ICD-10-CM | POA: Diagnosis not present

## 2019-01-13 DIAGNOSIS — I2584 Coronary atherosclerosis due to calcified coronary lesion: Secondary | ICD-10-CM | POA: Diagnosis not present

## 2019-01-13 DIAGNOSIS — I10 Essential (primary) hypertension: Secondary | ICD-10-CM | POA: Diagnosis not present

## 2019-02-10 ENCOUNTER — Telehealth: Payer: Self-pay | Admitting: *Deleted

## 2019-02-10 NOTE — Telephone Encounter (Signed)
Pt contacted per Dr Wyline Mood. History reviewed. No symptoms to suggest any unstable cardiac conditions. Based on discussion, with current pandemic situation, we have postponed 02/11/19 appointment until June 2020. If symptoms change, pt has been instructed to contact our office - pt declined virtual appt at this time

## 2019-02-11 ENCOUNTER — Ambulatory Visit: Payer: PRIVATE HEALTH INSURANCE | Admitting: Cardiology

## 2019-04-14 DIAGNOSIS — L720 Epidermal cyst: Secondary | ICD-10-CM | POA: Diagnosis not present

## 2019-04-14 DIAGNOSIS — J449 Chronic obstructive pulmonary disease, unspecified: Secondary | ICD-10-CM | POA: Diagnosis not present

## 2019-04-14 DIAGNOSIS — I10 Essential (primary) hypertension: Secondary | ICD-10-CM | POA: Diagnosis not present

## 2019-04-14 DIAGNOSIS — I2584 Coronary atherosclerosis due to calcified coronary lesion: Secondary | ICD-10-CM | POA: Diagnosis not present

## 2019-05-04 ENCOUNTER — Telehealth: Payer: Self-pay | Admitting: Cardiology

## 2019-05-04 NOTE — Telephone Encounter (Signed)
Pt and wife called for reassurance of how to take Metolazone, lasix and Kdur. Pt voiced understanding.

## 2019-05-04 NOTE — Telephone Encounter (Signed)
Patient wants to speak with nurse regarding "how to take his water pill" / tg

## 2019-05-04 NOTE — Telephone Encounter (Signed)
Virtual Visit Pre-Appointment Phone Call  "(Name), I am calling you today to discuss your upcoming appointment. We are currently trying to limit exposure to the virus that causes COVID-19 by seeing patients at home rather than in the office."  1. "What is the BEST phone number to call the day of the visit?" - include this in appointment notes  2. Do you have or have access to (through a family member/friend) a smartphone with video capability that we can use for your visit?" a. If yes - list this number in appt notes as cell (if different from BEST phone #) and list the appointment type as a VIDEO visit in appointment notes b. If no - list the appointment type as a PHONE visit in appointment notes  3. Confirm consent - "In the setting of the current Covid19 crisis, you are scheduled for a (phone or video) visit with your provider on (date) at (time).  Just as we do with many in-office visits, in order for you to participate in this visit, we must obtain consent.  If you'd like, I can send this to your mychart (if signed up) or email for you to review.  Otherwise, I can obtain your verbal consent now.  All virtual visits are billed to your insurance company just like a normal visit would be.  By agreeing to a virtual visit, we'd like you to understand that the technology does not allow for your provider to perform an examination, and thus may limit your provider's ability to fully assess your condition. If your provider identifies any concerns that need to be evaluated in person, we will make arrangements to do so.  Finally, though the technology is pretty good, we cannot assure that it will always work on either your or our end, and in the setting of a video visit, we may have to convert it to a phone-only visit.  In either situation, we cannot ensure that we have a secure connection.  Are you willing to proceed?" STAFF: Did the patient verbally acknowledge consent to telehealth visit? Document  YES/NO here: Yes  4. Advise patient to be prepared - "Two hours prior to your appointment, go ahead and check your blood pressure, pulse, oxygen saturation, and your weight (if you have the equipment to check those) and write them all down. When your visit starts, your provider will ask you for this information. If you have an Apple Watch or Kardia device, please plan to have heart rate information ready on the day of your appointment. Please have a pen and paper handy nearby the day of the visit as well."  5. Give patient instructions for MyChart download to smartphone OR [email or web address] as below if video visit (depending on what platform provider is using)  6. Inform patient they will receive a phone call 15 minutes prior to their appointment time (may be from unknown caller ID) so they should be prepared to answer    TELEPHONE CALL NOTE  EIAN VANDERVELDEN has been deemed a candidate for a follow-up tele-health visit to limit community exposure during the Covid-19 pandemic. I spoke with the patient via phone to ensure availability of phone/video source, confirm preferred email & phone number, and discuss instructions and expectations.  I reminded ESTIL VALLEE to be prepared with any vital sign and/or heart rhythm information that could potentially be obtained via home monitoring, at the time of his visit. I reminded VIRAAT VANPATTEN to expect a phone call prior to  his visit.  Dyane Dustmanerry L Goins 05/04/2019 9:33 AM

## 2019-05-06 ENCOUNTER — Encounter: Payer: Self-pay | Admitting: Cardiology

## 2019-05-06 ENCOUNTER — Telehealth (INDEPENDENT_AMBULATORY_CARE_PROVIDER_SITE_OTHER): Payer: Medicare Other | Admitting: Cardiology

## 2019-05-06 ENCOUNTER — Other Ambulatory Visit: Payer: Self-pay

## 2019-05-06 VITALS — BP 141/58 | Ht 66.5 in | Wt 191.0 lb

## 2019-05-06 DIAGNOSIS — I5022 Chronic systolic (congestive) heart failure: Secondary | ICD-10-CM

## 2019-05-06 DIAGNOSIS — I2510 Atherosclerotic heart disease of native coronary artery without angina pectoris: Secondary | ICD-10-CM

## 2019-05-06 MED ORDER — TORSEMIDE 20 MG PO TABS: ORAL_TABLET | ORAL | 3 refills | Status: DC

## 2019-05-06 MED ORDER — ASPIRIN EC 81 MG PO TBEC: 81.0000 mg | DELAYED_RELEASE_TABLET | Freq: Every day | ORAL | 3 refills | Status: DC

## 2019-05-06 NOTE — Progress Notes (Signed)
Virtual Visit via Telephone Note   This visit type was conducted due to national recommendations for restrictions regarding the COVID-19 Pandemic (e.g. social distancing) in an effort to limit this patient's exposure and mitigate transmission in our community.  Due to his co-morbid illnesses, this patient is at least at moderate risk for complications without adequate follow up.  This format is felt to be most appropriate for this patient at this time.  The patient did not have access to video technology/had technical difficulties with video requiring transitioning to audio format only (telephone).  All issues noted in this document were discussed and addressed.  No physical exam could be performed with this format.  Please refer to the patient's chart for his  consent to telehealth for [facility].   Date:  05/06/2019   ID:  David Mack, DOB 11/11/1945, MRN [id number]  Patient Location: Home Provider Location: Office  PCP:  Toma DeitersHasanaj, Xaje A, MD  Cardiologist:  Dr Dina RichJonathan Branch MD Electrophysiologist:  None   Evaluation Performed:  Follow-Up Visit  Chief Complaint:  3 month follow up  History of Present Illness:    David ManeRonald W Ibsen is a 73 y.o. male seen today for follow up of the following medical problems.     1. Chronic systolic HF - [facility] - 10/2018 [facility]: LVEF 25-30%, grade II diastolic dysfunction, mod MR, PASP 64 - [facility] severe multivessel disease, no PCI targets - [facility]  Jan 2020 RHC: mean PA 35, PCWP 22, CI 3.37   - no recent SOB or DOE - has had some recent edema, abdominal distension. He takes zaroxyln prn, which resolved his recent swelling. Takes 40mg  daily of lasix. - home weights 190-194 lbs.    2. CAD - He underwent left heart catheterization on 10/30/2018 which [facility]  - Nov 13 2018 CABG  LIMA-LAD, SVG-OM, SVG-ramus, SVG-PDA)  - no recent chest pain  3. Pulmonary nodule on CT needs follow-up CT in 6 months   The patient does not have symptoms concerning for COVID-19 infection (fever, chills, cough, or new shortness of breath).    Past Medical History:  Diagnosis Date  . Arthritis    "knees" (11/10/2018)  . COPD (chronic obstructive pulmonary disease) (HCC)   . Hypertension    Past Surgical History:  Procedure Laterality Date  . CATARACT EXTRACTION W/PHACO Left 07/06/2015   Procedure: CATARACT EXTRACTION PHACO AND INTRAOCULAR LENS PLACEMENT (IOC);  Surgeon: Gemma PayorKerry Hunt, MD;  Location: [facility];  Service: Ophthalmology;  Laterality: Left;  CDE 8.33  . CATARACT EXTRACTION W/PHACO Right 08/21/2015   Procedure: CATARACT EXTRACTION PHACO AND INTRAOCULAR LENS PLACEMENT (IOC);  Surgeon: Gemma PayorKerry Hunt, MD;  Location: [facility];  Service: Ophthalmology;  Laterality: Right;  CDE:9.89  . CORONARY ARTERY BYPASS GRAFT N/A 11/13/2018   Procedure: CORONARY ARTERY BYPASS GRAFTING (CABG), ON PUMP, TIMES FOUR, USING LEFT INTERNAL MAMMARY ARTERY, AND BILATERAL GREATER SAPHENOUS VEIN HARVESTED ENDOSCOPICALLY;  Surgeon: Kerin PernaVan Trigt, Peter, MD;  [facility];  Service: Open Heart Surgery;  Laterality: N/A;  . CORONARY ARTERY BYPASS GRAFT  11/13/2018   4 vessell  . KNEE ARTHROSCOPY Right 2006  . KNEE ARTHROSCOPY Left 2000  . RIGHT HEART CATH N/A 11/12/2018   Procedure: RIGHT HEART CATH;  Surgeon: Laurey MoraleMcLean, Dalton S, MD;  [facility];  Service: Cardiovascular;  Laterality: N/A;  . TEE WITHOUT  CARDIOVERSION N/A 11/13/2018   Procedure: TRANSESOPHAGEAL ECHOCARDIOGRAM (TEE);  Surgeon: Donata ClayVan Trigt, Theron AristaPeter, MD;  [facility];  Service: Open Heart Surgery;  Laterality: N/A;     No outpatient medications have been marked as taking for the 05/06/19 encounter (Appointment) with Antoine PocheBranch, Jonathan F, MD.     Allergies:   Albuterol and Pulmicort [budesonide]   Social History   Tobacco Use  .  Smoking status: Former Smoker    Packs/day: 0.50    Years: 30.00    Pack years: 15.00    Types: Cigarettes  . Smokeless tobacco: Never Used  . Tobacco comment: HAS NOT SMOKED SINCE SURGERY  Substance Use Topics  . Alcohol use: Not Currently    Alcohol/week: 48.0 standard drinks    Types: 48 Cans of beer per week    Comment: have not drank whisky is 2 months - only beer  . Drug use: Never     Family Hx: The patient's family history includes Alzheimer's disease in his mother; Heart disease in his mother.  ROS:   Please see the history of present illness.    Per hpi All other systems reviewed and are negative.   Prior CV studies:   The following studies were reviewed today:  [facility] FINDINGS/RECOMMENDATIONS:  1. Severe fluoroscopic coronary calcification.  2. LMT distal 25%. LAD heavily calcified prox 80% and mid focal 50%. Long D1 has ostial 80% stenosis. Cx prox tandem heavily calcified 80% and 70% stenoses. Long OM2 has prox 40% plaque. Dom RCA prox 99% followed by mid 100%. Mid to distal segments have good collaterals from left.  Gave pt and wife pics and updated referring cardiologist (Dr Faylene MillionYeboah). Urged to stop smoking.   Echocardiogram 10/28/18 [facility] SUMMARY The left ventricle is moderately dilated. There is normal left ventricular wall thickness.  Left ventricular systolic function is severely reduced. LV ejection fraction = 25-30%. Only the basal segments show significant contractile function, except for the  basal inferolateral segment which is also contractile. The mid and apical  segments are severly hypokinetic to akinetic.  Left ventricular filling pattern is pseudonormal. The right ventricle is mildly dilated. The left atrium is severely dilated. The right atrium is moderately dilated. There is tenting of the mitral valve leaflets. There is moderate mitral regurgitation. There is mild tricuspid regurgitation.  Moderate pulmonary hypertension. Estimated right ventricular systolic  pressure is 64 mmHg. Mildly dilated ascending aorta. (3.7cm) There is no pericardial effusion. There is no comparison study available.  Labs/Other Tests and Data Reviewed:    EKG:  No ECG reviewed.  Recent Labs: 11/11/2018: TSH 2.980 11/14/2018: Magnesium 2.1 11/16/2018: ALT 15 11/19/2018: Hemoglobin 9.3; Platelets 242 01/04/2019: B Natriuretic Peptide 316.0; BUN 12; Creatinine, Ser 0.86; Potassium 4.2; Sodium 132   Recent Lipid Panel No results found for: CHOL, TRIG, HDL, CHOLHDL, LDLCALC, LDLDIRECT  Wt Readings from Last 3 Encounters:  01/04/19 190 lb (86.2 kg)  01/04/19 190 lb (86.2 kg)  12/28/18 193 lb (87.5 kg)     Objective:    Vital Signs:   Today's Vitals   05/06/19 1112  BP: (!) 141/58  Weight: 191 lb (86.6 kg)  Height: 5' 6.5" (1.689 m)   Body mass index is 30.37 kg/m.  NOrmal affect. Normal speech pattern and tone. No audible signs of SOB or wheezing. Comfortable, no apparent distress  ASSESSMENT & PLAN:    1. Chronic systolic HF/ICM - no current symptoms. Recent edema resolved with prn metolazone. I think he has  a lot of options with his loop diuretic to try to avoid using metolazone. When had recent edema 80mg  of lasix did not help. Change lasix to torsemide 20mg  daily, may take additional 20mg  as needed for weight gain or edema. He will still have metolazone as a back up for now but I think we can get him on a loop diuretic regimen he will not require anymore - repeat echo, has been 6 months since revasc on medical therapy. If LVEF still low, consider further titration of meds, possibly changing to entresto. Would need to discuss an ICD  2. CAD - no recent symptoms - lower ASA to 81mg  daily     COVID-19 Education: The signs and symptoms of COVID-19 were discussed with the patient and how to seek care for testing (follow up with PCP or arrange E-visit).  The importance of social  distancing was discussed today.  Time:   Today, I have spent 20 minutes with the patient with telehealth technology discussing the above problems.     Medication Adjustments/Labs and Tests Ordered: Current medicines are reviewed at length with the patient today.  Concerns regarding medicines are outlined above.   Tests Ordered: No orders of the defined types were placed in this encounter.   Medication Changes: No orders of the defined types were placed in this encounter.   Follow Up:  Virtual Visit in 2 month(s)  Signed, Carlyle Dolly, MD  05/06/2019 10:02 AM    [place]

## 2019-05-06 NOTE — Patient Instructions (Signed)
Medication Instructions:  DECREASE ASPIRIN TO 81 MG DAILY  STOP LASIX   START TORSEMIDE 20 MG DAILY , MAY TAKE AN ADDITIONAL 20 MG DAILY AS NEEDED FOR WEIGHT GAIN OR SWELLING  Labwork: 2 WEEKS  BMET  MAGNESIUM   Testing/Procedures: Your physician has requested that you have an echocardiogram. Echocardiography is a painless test that uses sound waves to create images of your heart. It provides your doctor with information about the size and shape of your heart and how well your heart's chambers and valves are working. This procedure takes approximately one hour. There are no restrictions for this procedure.    Follow-Up: Your physician recommends that you schedule a follow-up appointment in: 2 MONTHS   Any Other Special Instructions Will Be Listed Below (If Applicable).     If you need a refill on your cardiac medications before your next appointment, please call your pharmacy.

## 2019-05-21 ENCOUNTER — Other Ambulatory Visit (HOSPITAL_COMMUNITY)
Admission: RE | Admit: 2019-05-21 | Discharge: 2019-05-21 | Disposition: A | Discharge status: Home / Self Care | Payer: Medicare Other | Source: Ambulatory Visit | Attending: Cardiology | Admitting: Cardiology

## 2019-05-21 ENCOUNTER — Other Ambulatory Visit: Payer: Self-pay

## 2019-05-21 ENCOUNTER — Ambulatory Visit (HOSPITAL_COMMUNITY)
Admission: RE | Admit: 2019-05-21 | Discharge: 2019-05-21 | Disposition: A | Discharge status: Home / Self Care | Payer: Medicare Other | Source: Ambulatory Visit | Attending: Cardiology | Admitting: Cardiology

## 2019-05-21 DIAGNOSIS — I5022 Chronic systolic (congestive) heart failure: Secondary | ICD-10-CM | POA: Insufficient documentation

## 2019-05-21 LAB — BASIC METABOLIC PANEL
Anion gap: 8 (ref 5–15)
BUN: 10 mg/dL (ref 8–23)
CO2: 25 mmol/L (ref 22–32)
Calcium: 9.2 mg/dL (ref 8.9–10.3)
Chloride: 97 mmol/L — ABNORMAL LOW (ref 98–111)
Creatinine, Ser: 0.87 mg/dL (ref 0.61–1.24)
GFR calc Af Amer: >60 mL/min (ref >60)
GFR calc non Af Amer: >60 mL/min (ref >60)
Glucose, Bld: 108 mg/dL — ABNORMAL HIGH (ref 70–99)
Potassium: 4.4 mmol/L (ref 3.5–5.1)
Sodium: 130 mmol/L — ABNORMAL LOW (ref 135–145)

## 2019-05-21 LAB — MAGNESIUM: Magnesium: 2.2 mg/dL (ref 1.7–2.4)

## 2019-05-21 NOTE — Progress Notes (Signed)
*  PRELIMINARY RESULTS* Echocardiogram 2D Echocardiogram has been performed.  David Mack 05/21/2019, 2:20 PM

## 2019-05-26 ENCOUNTER — Telehealth: Payer: Self-pay

## 2019-05-26 NOTE — Telephone Encounter (Signed)
Called pt. No answer, left message for pt to return call.  

## 2019-05-26 NOTE — Telephone Encounter (Signed)
-----   Message from Arnoldo Lenis, MD sent at 05/26/2019 12:37 PM EDT ----- Echo shows heart function has significantly improved and essentially is back to normal   Zandra Abts MD

## 2019-07-15 DIAGNOSIS — I2584 Coronary atherosclerosis due to calcified coronary lesion: Secondary | ICD-10-CM | POA: Diagnosis not present

## 2019-07-15 DIAGNOSIS — I10 Essential (primary) hypertension: Secondary | ICD-10-CM | POA: Diagnosis not present

## 2019-07-15 DIAGNOSIS — Z1389 Encounter for screening for other disorder: Secondary | ICD-10-CM | POA: Diagnosis not present

## 2019-07-15 DIAGNOSIS — Z0000 Encounter for general adult medical examination without abnormal findings: Secondary | ICD-10-CM | POA: Diagnosis not present

## 2019-07-15 DIAGNOSIS — J449 Chronic obstructive pulmonary disease, unspecified: Secondary | ICD-10-CM | POA: Diagnosis not present

## 2019-07-22 DIAGNOSIS — Z23 Encounter for immunization: Secondary | ICD-10-CM | POA: Diagnosis not present

## 2019-07-30 ENCOUNTER — Encounter: Payer: Self-pay | Admitting: Cardiology

## 2019-07-30 ENCOUNTER — Other Ambulatory Visit: Payer: Self-pay

## 2019-07-30 ENCOUNTER — Telehealth (INDEPENDENT_AMBULATORY_CARE_PROVIDER_SITE_OTHER): Payer: Medicare Other | Admitting: Cardiology

## 2019-07-30 VITALS — BP 128/68 | HR 62 | Ht 66.0 in | Wt 198.0 lb

## 2019-07-30 DIAGNOSIS — I2510 Atherosclerotic heart disease of native coronary artery without angina pectoris: Secondary | ICD-10-CM

## 2019-07-30 DIAGNOSIS — I5022 Chronic systolic (congestive) heart failure: Secondary | ICD-10-CM

## 2019-07-30 DIAGNOSIS — I255 Ischemic cardiomyopathy: Secondary | ICD-10-CM | POA: Diagnosis not present

## 2019-07-30 MED ORDER — TORSEMIDE 20 MG PO TABS: ORAL_TABLET | ORAL | 3 refills | Status: DC

## 2019-07-30 NOTE — Progress Notes (Signed)
Virtual Visit via Telephone Note   This visit type was conducted due to national recommendations for restrictions regarding the COVID-19 Pandemic (e.g. social distancing) in an effort to limit this patient's exposure and mitigate transmission in our community.  Due to his co-morbid illnesses, this patient is at least at moderate risk for complications without adequate follow up.  This format is felt to be most appropriate for this patient at this time.  The patient did not have access to video technology/had technical difficulties with video requiring transitioning to audio format only (telephone).  All issues noted in this document were discussed and addressed.  No physical exam could be performed with this format.  Please refer to the patient's chart for his  consent to telehealth for [facility].   Date:  07/30/2019   ID:  David Mack, DOB 04-Apr-1946, MRN [id number]  Patient Location: Home Provider Location: Office  PCP:  Toma Deiters, MD  Cardiologist:  Chrystie Nose, MD  Electrophysiologist:  None   Evaluation Performed:  Follow-Up Visit  Chief Complaint:  Follow up  History of Present Illness:    David Mack is a 73 y.o. male seen today for follow up of the following medical problems.    1. Chronic systolic HF - new diagnosis during 10/2018 admission at [facility] - 10/2018 [facility]: LVEF 25-30%, grade II diastolic dysfunction, mod MR, PASP 64 - 10/2018 cath [facility]: severe multivessel disease, no PCI targets - he left [facility], [facility] in late Dec 2019  Jan 2020 RHC: mean PA 35, PCWP 22, CI 3.37   - home weights 190-194 lbs.  -LVEF has recently normalized, 05/2019 echo LVEF 55-60%,   - mild SOB at times, improves with inhalers - no recent edema - compliant with meds. Takes torsemide 20mg  daily, will take extra 20mg  as needed. Only used metolazone just once.   2. CAD - He underwent left heart catheterization on 10/30/2018 which  demonstrated multivessel coronary disease at [facility]  - Nov 13 2018 CABG LIMA-LAD, SVG-OM, SVG-ramus, SVG-PDA) - no recent chest pain   3. Pulmonary nodule on CT needs follow-up CT in 6 months - he wants to speak with pcp about repeat imaging   The patient does not have symptoms concerning for COVID-19 infection (fever, chills, cough, or new shortness of breath).    Past Medical History:  Diagnosis Date  . Arthritis    "knees" (11/10/2018)  . COPD (chronic obstructive pulmonary disease) (HCC)   . Hypertension    Past Surgical History:  Procedure Laterality Date  . CATARACT EXTRACTION W/PHACO Left 07/06/2015   Procedure: CATARACT EXTRACTION PHACO AND INTRAOCULAR LENS PLACEMENT (IOC);  Surgeon: Gemma Payor, MD;  Location: [facility];  Service: Ophthalmology;  Laterality: Left;  CDE 8.33  . CATARACT EXTRACTION W/PHACO Right 08/21/2015   Procedure: CATARACT EXTRACTION PHACO AND INTRAOCULAR LENS PLACEMENT (IOC);  Surgeon: Gemma Payor, MD;  Location: [facility];  Service: Ophthalmology;  Laterality: Right;  CDE:9.89  . CORONARY ARTERY BYPASS GRAFT N/A 11/13/2018   Procedure: CORONARY ARTERY BYPASS GRAFTING (CABG), ON PUMP, TIMES FOUR, USING LEFT INTERNAL MAMMARY ARTERY, AND BILATERAL GREATER SAPHENOUS VEIN HARVESTED ENDOSCOPICALLY;  Surgeon: Kerin Perna, MD;  Location: [facility] OR;  Service: Open Heart Surgery;  Laterality: N/A;  . CORONARY ARTERY BYPASS GRAFT  11/13/2018   4 vessell  . KNEE ARTHROSCOPY Right 2006  . KNEE ARTHROSCOPY Left 2000  . RIGHT HEART CATH N/A 11/12/2018   Procedure: RIGHT HEART CATH;  Surgeon: Laurey MoraleMcLean, Dalton S, MD;  [facility];  Service: Cardiovascular;  Laterality: N/A;  . TEE WITHOUT CARDIOVERSION N/A 11/13/2018   Procedure: TRANSESOPHAGEAL ECHOCARDIOGRAM (TEE);  Surgeon: Donata ClayVan Trigt, Theron AristaPeter, MD;  [facility];  Service: Open Heart Surgery;  Laterality: N/A;     No outpatient medications have been marked as taking for the 07/30/19 encounter (Appointment)  with Antoine PocheBranch, Jonathan F, MD.     Allergies:   Albuterol and Pulmicort [budesonide]   Social History   Tobacco Use  . Smoking status: Former Smoker    Packs/day: 0.50    Years: 30.00    Pack years: 15.00    Types: Cigarettes    Quit date: 11/04/2018    Years since quitting: 0.7  . Smokeless tobacco: Never Used  . Tobacco comment: HAS NOT SMOKED SINCE SURGERY  Substance Use Topics  . Alcohol use: Not Currently    Alcohol/week: 48.0 standard drinks    Types: 48 Cans of beer per week    Comment: have not drank whisky is 2 months - only beer  . Drug use: Never     Family Hx: The patient's family history includes Alzheimer's disease in his mother; Heart disease in his mother.  ROS:   Please see the history of present illness.     All other systems reviewed and are negative.   Prior CV studies:   The following studies were reviewed today:  [facility] heart [facility] FINDINGS/RECOMMENDATIONS:  1. Severe fluoroscopic coronary calcification.  2. LMT distal 25%. LAD heavily calcified prox 80% and mid focal 50%. Long D1 has ostial 80% stenosis. Cx prox tandem heavily calcified 80% and 70% stenoses. Long OM2 has prox 40% plaque. Dom RCA prox 99% followed by mid 100%. Mid to distal segments have good collaterals from left.  Gave pt and wife pics and updated referring cardiologist (Dr Faylene MillionYeboah). Urged to stop smoking.   Echocardiogram 10/28/18 [facility] SUMMARY The left ventricle is moderately dilated. There is normal left ventricular wall thickness.  Left ventricular systolic function is severely reduced. LV ejection fraction = 25-30%. Only the basal segments show significant contractile function, except for the  basal inferolateral segment which is also contractile. The mid and apical  segments are severly hypokinetic to akinetic.  Left ventricular filling pattern is pseudonormal. The right ventricle is mildly dilated. The left atrium is severely  dilated. The right atrium is moderately dilated. There is tenting of the mitral valve leaflets. There is moderate mitral regurgitation. There is mild tricuspid regurgitation. Moderate pulmonary hypertension. Estimated right ventricular systolic  pressure is 64 mmHg. Mildly dilated ascending aorta. (3.7cm) There is no pericardial effusion. There is no comparison study available.  05/2019 echo IMPRESSIONS    1. The left ventricle has normal systolic function, with an ejection fraction of 55-60%. The cavity size was normal. Left ventricular diastolic Doppler parameters are indeterminate.  2. The right ventricle has low normal systolic function. The cavity was mildly enlarged. There is no increase in right ventricular wall thickness.  3. Left atrial size was severely dilated.  4. Right atrial size was severely dilated.  5. The mitral valve is abnormal. Mild thickening of the mitral valve leaflet. Mild calcification of the mitral valve leaflet. There is mild mitral annular calcification present.  6. The aortic valve is tricuspid. No stenosis of the aortic valve.  7. The aortic root is normal in size and structure.  8. Pulmonary hypertension is moderately elevated, PASP is 43 mmHg.  Labs/Other Tests and Data Reviewed:    EKG:  No ECG reviewed.  Recent Labs: 11/11/2018: TSH 2.980 11/16/2018: ALT 15 11/19/2018: Hemoglobin 9.3; Platelets 242 01/04/2019: B Natriuretic Peptide 316.0 05/21/2019: BUN 10; Creatinine, Ser 0.87; Magnesium 2.2; Potassium 4.4; Sodium 130   Recent Lipid Panel No results found for: CHOL, TRIG, HDL, CHOLHDL, LDLCALC, LDLDIRECT  Wt Readings from Last 3 Encounters:  05/06/19 191 lb (86.6 kg)  01/04/19 190 lb (86.2 kg)  01/04/19 190 lb (86.2 kg)     Objective:    Vital Signs:   Today's Vitals   07/30/19 1228  BP: 128/68  Pulse: 62  Weight: 198 lb (89.8 kg)  Height: 5\' 6"  (1.676 m)   Body mass index is 31.96 kg/m. Normal affect. Normal speech pattern and  tone. Comfortable, no apparent distress. No audible signs of SOB or wheezing.   ASSESSMENT & PLAN:    1. Chronic systolic HF/ICM - LVEF has normalized. Still with some issues of fluid retention, increase torsemide to 40mg  daily may take additional 20mg  as needed. Asked to try to limit his prn metolazone - check BMET/Mg in 2 weeks  2. CAD - no symptoms, continue current meds - recheck lipid panel  COVID-19 Education: The signs and symptoms of COVID-19 were discussed with the patient and how to seek care for testing (follow up with PCP or arrange E-visit).  The importance of social distancing was discussed today.  Time:   Today, I have spent 20 minutes with the patient with telehealth technology discussing the above problems.     Medication Adjustments/Labs and Tests Ordered: Current medicines are reviewed at length with the patient today.  Concerns regarding medicines are outlined above.   Tests Ordered: No orders of the defined types were placed in this encounter.   Medication Changes: No orders of the defined types were placed in this encounter.   Follow Up:  In Person in 6 month(s)  Signed, Carlyle Dolly, MD  07/30/2019 11:46 AM    [place]

## 2019-07-30 NOTE — Patient Instructions (Signed)
Medication Instructions:  INCREASE TORSEMIDE TO 40 MG DAILY - MAY TAKE AN EXTRA 20 MG DAILY AS NEEDED FOR SWELLING/WEIGHT GAIN   Labwork: I WILL REQUEST LABS FROM PCP  2 WEEKS  BMET MAGNESIUM LIPID PROFILE   Testing/Procedures: NONE  Follow-Up: Your physician wants you to follow-up in: 6 MONTHS.  You will receive a reminder letter in the mail two months in advance. If you don't receive a letter, please call our office to schedule the follow-up appointment.   Any Other Special Instructions Will Be Listed Below (If Applicable).     If you need a refill on your cardiac medications before your next appointment, please call your pharmacy.

## 2019-09-27 ENCOUNTER — Telehealth: Payer: Self-pay | Admitting: Cardiology

## 2019-09-27 MED ORDER — ROSUVASTATIN CALCIUM 20 MG PO TABS: 20.0000 mg | ORAL_TABLET | Freq: Every evening | ORAL | 3 refills | Status: DC

## 2019-09-27 MED ORDER — LOSARTAN POTASSIUM 25 MG PO TABS: 25.0000 mg | ORAL_TABLET | Freq: Every day | ORAL | 3 refills | Status: DC

## 2019-09-27 MED ORDER — POTASSIUM CHLORIDE CRYS ER 20 MEQ PO TBCR: 20.0000 meq | EXTENDED_RELEASE_TABLET | Freq: Every day | ORAL | 3 refills | Status: DC

## 2019-09-27 NOTE — Telephone Encounter (Signed)
Refill complete 

## 2019-09-27 NOTE — Telephone Encounter (Signed)
Needing refills sent to [facility] on   rosuvastatin (CRESTOR) 20 MG tablet [[id number]]   potassium chloride SA (K-DUR,KLOR-CON) 20 MEQ tablet [[id number]]   losartan (COZAAR) 25 MG tablet [[id number]] ENDED

## 2019-10-14 DIAGNOSIS — J449 Chronic obstructive pulmonary disease, unspecified: Secondary | ICD-10-CM | POA: Diagnosis not present

## 2019-10-14 DIAGNOSIS — L600 Ingrowing nail: Secondary | ICD-10-CM | POA: Diagnosis not present

## 2019-10-14 DIAGNOSIS — Z125 Encounter for screening for malignant neoplasm of prostate: Secondary | ICD-10-CM | POA: Diagnosis not present

## 2019-10-14 DIAGNOSIS — I10 Essential (primary) hypertension: Secondary | ICD-10-CM | POA: Diagnosis not present

## 2019-10-14 DIAGNOSIS — Z0000 Encounter for general adult medical examination without abnormal findings: Secondary | ICD-10-CM | POA: Diagnosis not present

## 2019-10-14 DIAGNOSIS — I2584 Coronary atherosclerosis due to calcified coronary lesion: Secondary | ICD-10-CM | POA: Diagnosis not present

## 2020-01-09 IMAGING — CT CT CHEST W/ CM
2 of 3 series · 15 of 36 positions shown, 18 images · IV contrast (Omni 300)
Comparison: MR 11/09/2018

CLINICAL DATA: 72-year-old male with a history of vascular disease

EXAM:
CT ANGIOGRAPHY CHEST WITH CONTRAST
TECHNIQUE: Multidetector CT imaging of the chest was performed using the
standard protocol during bolus administration of intravenous
contrast. Multiplanar CT image reconstructions and MIPs were
obtained to evaluate the vascular anatomy.
CONTRAST:  75mL OMNIPAQUE IOHEXOL 300 MG/ML  SOLN

[Series 3: chest with 2mm st · axial · 0.81mm/px · z∈[+1189,+1441]mm · 12 of 148 slices shown, 15 images]
[im 11/148  mediastinal]
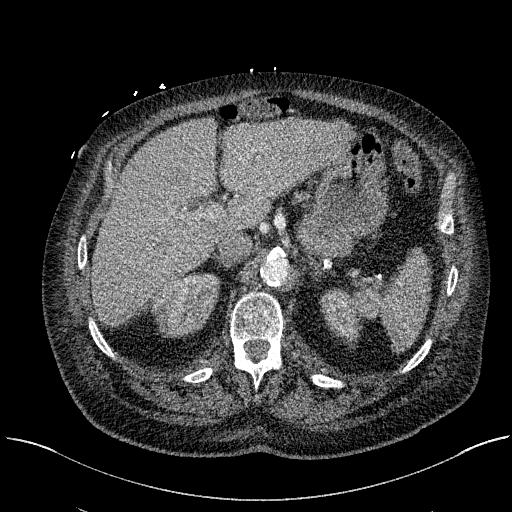
[im 11/148  lung]
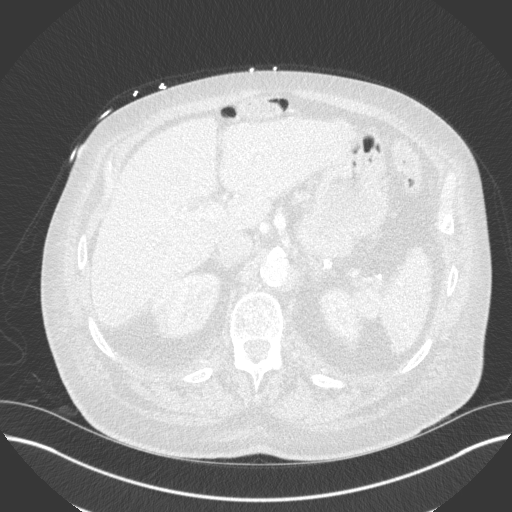
[im 22/148  lung]
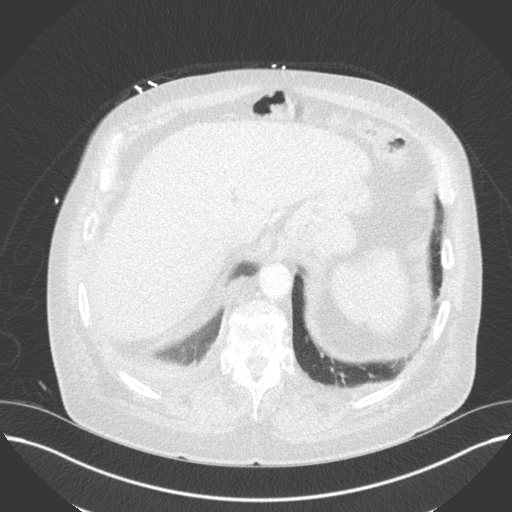
[im 33/148  lung]
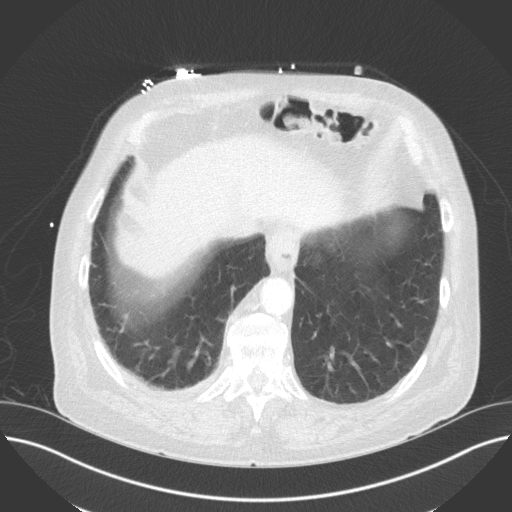
[im 44/148  lung]
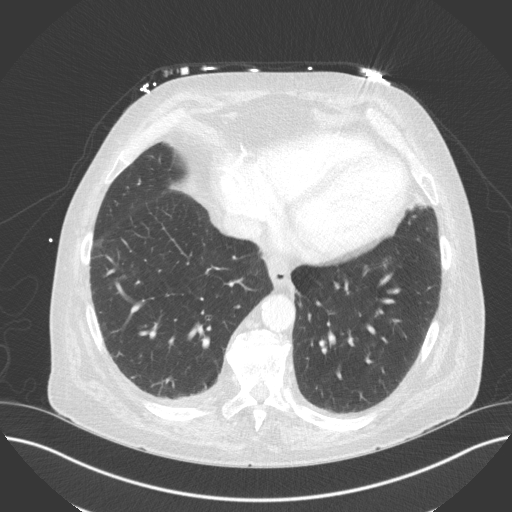
[im 55/148  mediastinal]
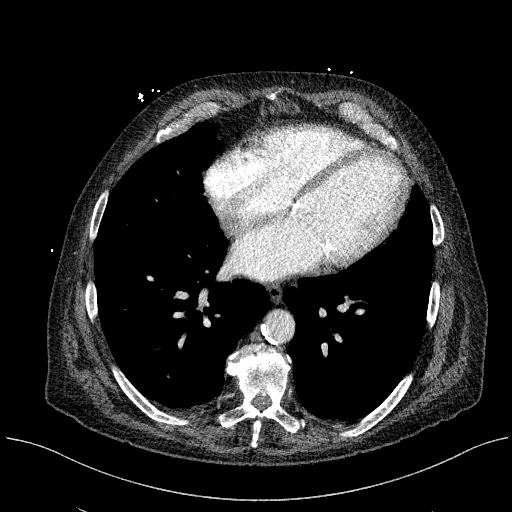
[im 55/148  lung]
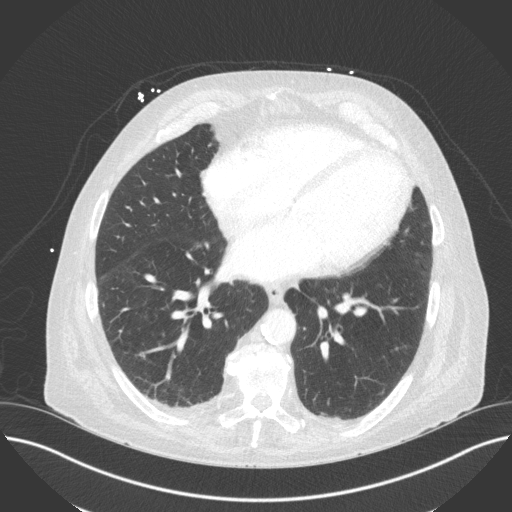
[im 66/148  lung]
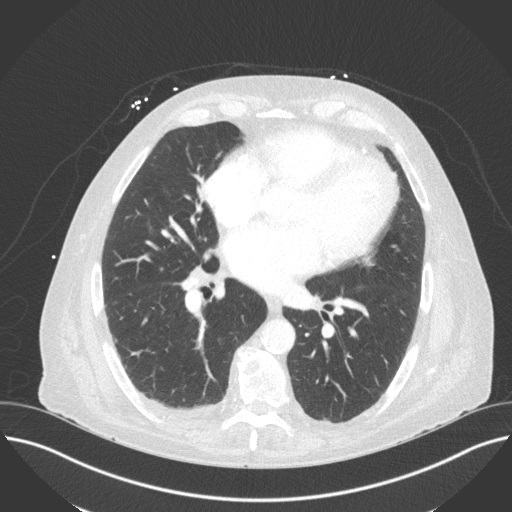
[im 82/148  lung]
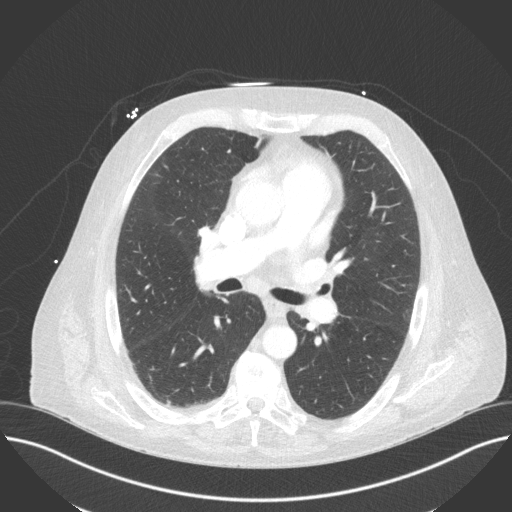
[im 93/148  lung]
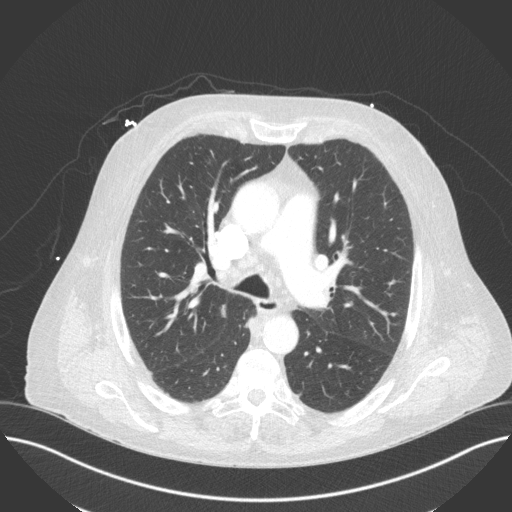
[im 104/148  mediastinal]
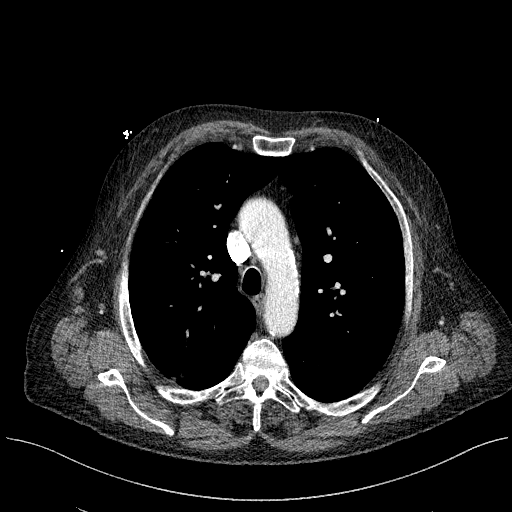
[im 104/148  lung]
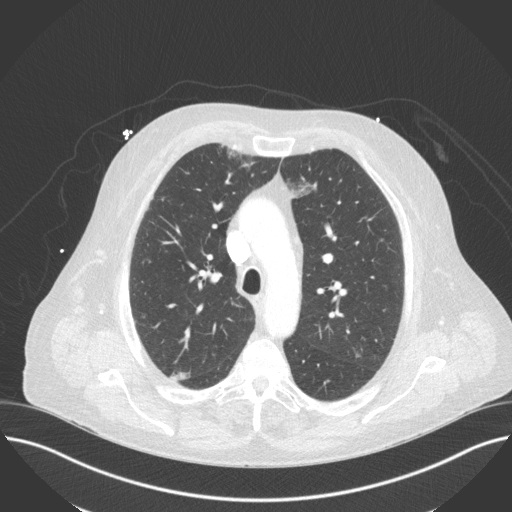
[im 115/148  lung]
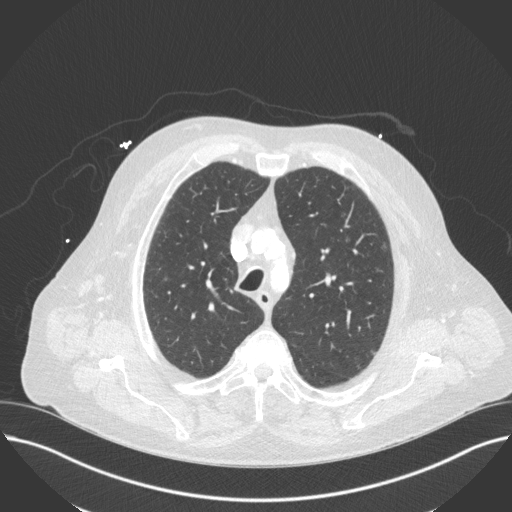
[im 126/148  lung]
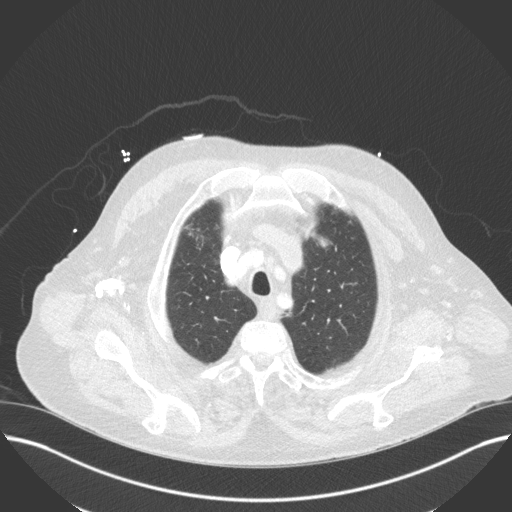
[im 137/148  lung]
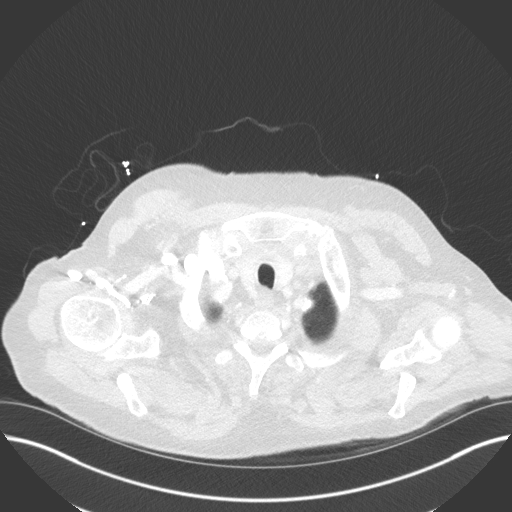

[Series 6: chest with 3mm st cor · coronal · 0.59mm/px · 3 of 114 slices shown]
[im 23/114  lung]
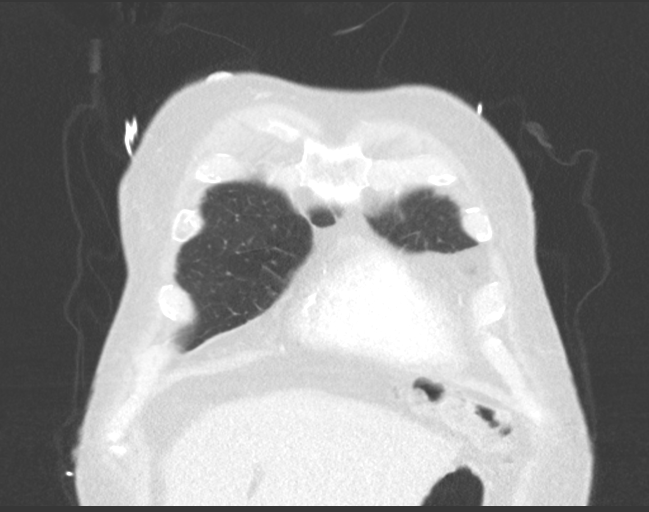
[im 46/114  lung]
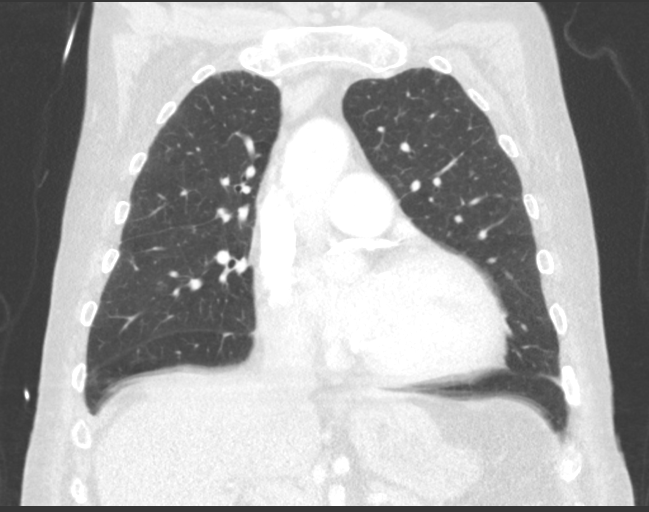
[im 68/114  lung]
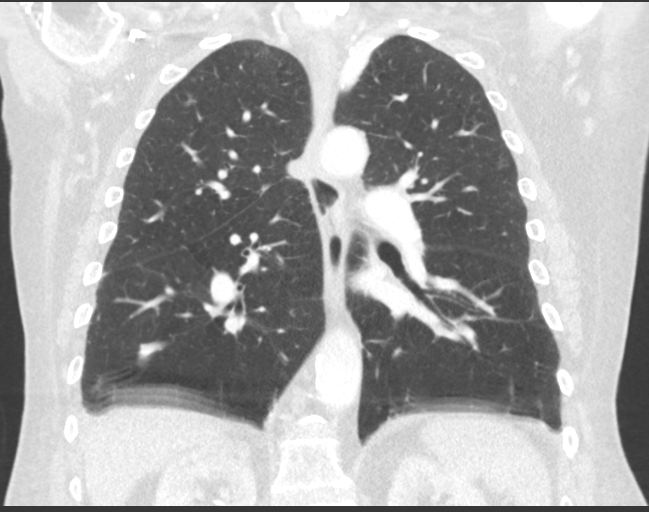

[15 of 36 positions shown; findings below may reference images not displayed]

FINDINGS: Cardiovascular:

Heart:

Cardiomegaly. No pericardial fluid/thickening. Dense and extensive
calcifications of left main, left anterior descending, circumflex,
right coronary arteries.

Aorta:

Minimal atherosclerotic changes of the thoracic aorta. No aneurysm.
Branch vessels are patent with atherosclerotic calcification at the
origin of the branch vessels.

Pulmonary arteries:

Pulmonary arteries not well evaluated given the timing of the
contrast bolus, however, no filling defects within the main or lobar
branches.

Mediastinum/Nodes: Unremarkable course of the thoracic esophagus.
Unremarkable thoracic inlet. No supraclavicular adenopathy. No
axillary adenopathy. Small lymph nodes of the mediastinum, with the
largest in the right lowest paratracheal nodal station measuring 7
mm. Subcarinal lymph node measures 9 mm.

Lungs/Pleura: No pneumothorax. No pleural effusion. No confluent
airspace disease. There are several bilateral sub solid nodules, as
manifested by ground-glass nodules. The largest is in the left upper
lobe measuring 16 mm on image 46 of series 4.

Upper Abdomen: No acute finding of the upper abdomen.

Musculoskeletal: No acute displaced fracture. Degenerative changes
of the thoracic spine.

Review of the MIP images confirms the above findings.
IMPRESSION: Advanced left main and 3 vessel coronary artery disease.

Aortic atherosclerosis.  Aortic Atherosclerosis ([id number]-[id number].W).

There are several bilateral ground-glass/sub solid nodules, with the
largest of the left upper lobe measuring 16 mm. These may be
inflammatory/infectious, however, noncontrast CT at 3-6 months is
recommended to assure stability/resolution. This recommendation
follows the consensus statement: Guidelines for Management of
Incidental Pulmonary Nodules Detected on CT Images: From the

Cardiomegaly

## 2020-01-18 DIAGNOSIS — I5023 Acute on chronic systolic (congestive) heart failure: Secondary | ICD-10-CM | POA: Diagnosis not present

## 2020-04-19 DIAGNOSIS — J449 Chronic obstructive pulmonary disease, unspecified: Secondary | ICD-10-CM | POA: Diagnosis not present

## 2020-04-19 DIAGNOSIS — I5023 Acute on chronic systolic (congestive) heart failure: Secondary | ICD-10-CM | POA: Diagnosis not present

## 2020-04-19 DIAGNOSIS — I2584 Coronary atherosclerosis due to calcified coronary lesion: Secondary | ICD-10-CM | POA: Diagnosis not present

## 2020-04-19 DIAGNOSIS — I10 Essential (primary) hypertension: Secondary | ICD-10-CM | POA: Diagnosis not present

## 2020-07-19 DIAGNOSIS — I10 Essential (primary) hypertension: Secondary | ICD-10-CM | POA: Diagnosis not present

## 2020-07-19 DIAGNOSIS — I2584 Coronary atherosclerosis due to calcified coronary lesion: Secondary | ICD-10-CM | POA: Diagnosis not present

## 2020-07-19 DIAGNOSIS — J449 Chronic obstructive pulmonary disease, unspecified: Secondary | ICD-10-CM | POA: Diagnosis not present

## 2020-07-19 DIAGNOSIS — I5032 Chronic diastolic (congestive) heart failure: Secondary | ICD-10-CM | POA: Diagnosis not present

## 2020-08-01 DIAGNOSIS — Z23 Encounter for immunization: Secondary | ICD-10-CM | POA: Diagnosis not present

## 2020-08-22 DIAGNOSIS — Z23 Encounter for immunization: Secondary | ICD-10-CM | POA: Diagnosis not present

## 2020-08-24 DIAGNOSIS — Z23 Encounter for immunization: Secondary | ICD-10-CM | POA: Diagnosis not present

## 2020-10-08 ENCOUNTER — Other Ambulatory Visit: Payer: Self-pay | Admitting: Cardiology

## 2020-10-19 DIAGNOSIS — I2584 Coronary atherosclerosis due to calcified coronary lesion: Secondary | ICD-10-CM | POA: Diagnosis not present

## 2020-10-19 DIAGNOSIS — Z0000 Encounter for general adult medical examination without abnormal findings: Secondary | ICD-10-CM | POA: Diagnosis not present

## 2020-10-19 DIAGNOSIS — Z1331 Encounter for screening for depression: Secondary | ICD-10-CM | POA: Diagnosis not present

## 2020-10-19 DIAGNOSIS — J449 Chronic obstructive pulmonary disease, unspecified: Secondary | ICD-10-CM | POA: Diagnosis not present

## 2020-10-19 DIAGNOSIS — I5032 Chronic diastolic (congestive) heart failure: Secondary | ICD-10-CM | POA: Diagnosis not present

## 2020-10-19 DIAGNOSIS — I10 Essential (primary) hypertension: Secondary | ICD-10-CM | POA: Diagnosis not present

## 2020-10-19 DIAGNOSIS — Z125 Encounter for screening for malignant neoplasm of prostate: Secondary | ICD-10-CM | POA: Diagnosis not present

## 2021-01-17 DIAGNOSIS — I5021 Acute systolic (congestive) heart failure: Secondary | ICD-10-CM | POA: Diagnosis not present

## 2021-04-18 DIAGNOSIS — I10 Essential (primary) hypertension: Secondary | ICD-10-CM | POA: Diagnosis not present

## 2021-04-18 DIAGNOSIS — I5021 Acute systolic (congestive) heart failure: Secondary | ICD-10-CM | POA: Diagnosis not present

## 2021-05-16 DIAGNOSIS — I6523 Occlusion and stenosis of bilateral carotid arteries: Secondary | ICD-10-CM | POA: Diagnosis not present

## 2021-07-24 DIAGNOSIS — I10 Essential (primary) hypertension: Secondary | ICD-10-CM | POA: Diagnosis not present

## 2021-07-24 DIAGNOSIS — I5021 Acute systolic (congestive) heart failure: Secondary | ICD-10-CM | POA: Diagnosis not present

## 2021-07-24 DIAGNOSIS — I700 Atherosclerosis of aorta: Secondary | ICD-10-CM | POA: Diagnosis not present

## 2021-09-27 DIAGNOSIS — Z23 Encounter for immunization: Secondary | ICD-10-CM | POA: Diagnosis not present

## 2021-10-23 DIAGNOSIS — Z0000 Encounter for general adult medical examination without abnormal findings: Secondary | ICD-10-CM | POA: Diagnosis not present

## 2021-10-23 DIAGNOSIS — I5022 Chronic systolic (congestive) heart failure: Secondary | ICD-10-CM | POA: Diagnosis not present

## 2021-10-23 DIAGNOSIS — I700 Atherosclerosis of aorta: Secondary | ICD-10-CM | POA: Diagnosis not present

## 2021-10-23 DIAGNOSIS — I10 Essential (primary) hypertension: Secondary | ICD-10-CM | POA: Diagnosis not present

## 2021-10-23 DIAGNOSIS — Z1331 Encounter for screening for depression: Secondary | ICD-10-CM | POA: Diagnosis not present

## 2022-01-22 DIAGNOSIS — J449 Chronic obstructive pulmonary disease, unspecified: Secondary | ICD-10-CM | POA: Diagnosis not present

## 2022-01-22 DIAGNOSIS — I5022 Chronic systolic (congestive) heart failure: Secondary | ICD-10-CM | POA: Diagnosis not present

## 2022-01-22 DIAGNOSIS — I10 Essential (primary) hypertension: Secondary | ICD-10-CM | POA: Diagnosis not present

## 2022-01-22 DIAGNOSIS — I700 Atherosclerosis of aorta: Secondary | ICD-10-CM | POA: Diagnosis not present

## 2022-04-24 DIAGNOSIS — J449 Chronic obstructive pulmonary disease, unspecified: Secondary | ICD-10-CM | POA: Diagnosis not present

## 2022-04-24 DIAGNOSIS — I10 Essential (primary) hypertension: Secondary | ICD-10-CM | POA: Diagnosis not present

## 2022-04-24 DIAGNOSIS — I700 Atherosclerosis of aorta: Secondary | ICD-10-CM | POA: Diagnosis not present

## 2022-04-24 DIAGNOSIS — I5022 Chronic systolic (congestive) heart failure: Secondary | ICD-10-CM | POA: Diagnosis not present

## 2022-07-31 DIAGNOSIS — I5022 Chronic systolic (congestive) heart failure: Secondary | ICD-10-CM | POA: Diagnosis not present

## 2022-07-31 DIAGNOSIS — J449 Chronic obstructive pulmonary disease, unspecified: Secondary | ICD-10-CM | POA: Diagnosis not present

## 2022-07-31 DIAGNOSIS — I10 Essential (primary) hypertension: Secondary | ICD-10-CM | POA: Diagnosis not present

## 2022-07-31 DIAGNOSIS — I700 Atherosclerosis of aorta: Secondary | ICD-10-CM | POA: Diagnosis not present

## 2022-10-16 DIAGNOSIS — Z23 Encounter for immunization: Secondary | ICD-10-CM | POA: Diagnosis not present

## 2022-12-12 DIAGNOSIS — H04123 Dry eye syndrome of bilateral lacrimal glands: Secondary | ICD-10-CM | POA: Diagnosis not present

## 2023-03-13 DIAGNOSIS — Z0000 Encounter for general adult medical examination without abnormal findings: Secondary | ICD-10-CM | POA: Diagnosis not present

## 2023-03-13 DIAGNOSIS — I10 Essential (primary) hypertension: Secondary | ICD-10-CM | POA: Diagnosis not present

## 2023-03-13 DIAGNOSIS — I700 Atherosclerosis of aorta: Secondary | ICD-10-CM | POA: Diagnosis not present

## 2023-03-13 DIAGNOSIS — Z1331 Encounter for screening for depression: Secondary | ICD-10-CM | POA: Diagnosis not present

## 2023-03-13 DIAGNOSIS — J449 Chronic obstructive pulmonary disease, unspecified: Secondary | ICD-10-CM | POA: Diagnosis not present

## 2023-03-13 DIAGNOSIS — Z125 Encounter for screening for malignant neoplasm of prostate: Secondary | ICD-10-CM | POA: Diagnosis not present

## 2023-03-13 DIAGNOSIS — I5022 Chronic systolic (congestive) heart failure: Secondary | ICD-10-CM | POA: Diagnosis not present

## 2023-08-12 ENCOUNTER — Other Ambulatory Visit: Payer: Self-pay

## 2023-08-12 ENCOUNTER — Inpatient Hospital Stay (HOSPITAL_COMMUNITY)
Admission: EM | Admit: 2023-08-12 | Discharge: 2023-09-12 | DRG: 682 | Disposition: E | Payer: Medicare Other | Attending: Internal Medicine | Admitting: Internal Medicine

## 2023-08-12 ENCOUNTER — Emergency Department (HOSPITAL_COMMUNITY): Payer: Medicare Other

## 2023-08-12 ENCOUNTER — Encounter (HOSPITAL_COMMUNITY): Payer: Self-pay | Admitting: Emergency Medicine

## 2023-08-12 DIAGNOSIS — Z951 Presence of aortocoronary bypass graft: Secondary | ICD-10-CM

## 2023-08-12 DIAGNOSIS — N179 Acute kidney failure, unspecified: Principal | ICD-10-CM | POA: Diagnosis present

## 2023-08-12 DIAGNOSIS — R339 Retention of urine, unspecified: Secondary | ICD-10-CM | POA: Diagnosis not present

## 2023-08-12 DIAGNOSIS — J441 Chronic obstructive pulmonary disease with (acute) exacerbation: Secondary | ICD-10-CM | POA: Diagnosis present

## 2023-08-12 DIAGNOSIS — I110 Hypertensive heart disease with heart failure: Secondary | ICD-10-CM | POA: Diagnosis present

## 2023-08-12 DIAGNOSIS — M7989 Other specified soft tissue disorders: Secondary | ICD-10-CM | POA: Diagnosis not present

## 2023-08-12 DIAGNOSIS — J9811 Atelectasis: Secondary | ICD-10-CM | POA: Diagnosis not present

## 2023-08-12 DIAGNOSIS — Z7189 Other specified counseling: Secondary | ICD-10-CM | POA: Diagnosis not present

## 2023-08-12 DIAGNOSIS — I272 Pulmonary hypertension, unspecified: Secondary | ICD-10-CM | POA: Diagnosis present

## 2023-08-12 DIAGNOSIS — I700 Atherosclerosis of aorta: Secondary | ICD-10-CM | POA: Diagnosis not present

## 2023-08-12 DIAGNOSIS — Z23 Encounter for immunization: Secondary | ICD-10-CM

## 2023-08-12 DIAGNOSIS — R0989 Other specified symptoms and signs involving the circulatory and respiratory systems: Secondary | ICD-10-CM | POA: Diagnosis not present

## 2023-08-12 DIAGNOSIS — J984 Other disorders of lung: Secondary | ICD-10-CM | POA: Diagnosis not present

## 2023-08-12 DIAGNOSIS — Z79899 Other long term (current) drug therapy: Secondary | ICD-10-CM

## 2023-08-12 DIAGNOSIS — E860 Dehydration: Secondary | ICD-10-CM | POA: Diagnosis present

## 2023-08-12 DIAGNOSIS — I509 Heart failure, unspecified: Secondary | ICD-10-CM | POA: Diagnosis not present

## 2023-08-12 DIAGNOSIS — F10231 Alcohol dependence with withdrawal delirium: Secondary | ICD-10-CM | POA: Diagnosis not present

## 2023-08-12 DIAGNOSIS — E861 Hypovolemia: Secondary | ICD-10-CM | POA: Diagnosis present

## 2023-08-12 DIAGNOSIS — Z66 Do not resuscitate: Secondary | ICD-10-CM | POA: Diagnosis present

## 2023-08-12 DIAGNOSIS — R0600 Dyspnea, unspecified: Secondary | ICD-10-CM | POA: Diagnosis not present

## 2023-08-12 DIAGNOSIS — Z6834 Body mass index (BMI) 34.0-34.9, adult: Secondary | ICD-10-CM | POA: Diagnosis not present

## 2023-08-12 DIAGNOSIS — L03119 Cellulitis of unspecified part of limb: Secondary | ICD-10-CM | POA: Diagnosis not present

## 2023-08-12 DIAGNOSIS — E876 Hypokalemia: Secondary | ICD-10-CM | POA: Diagnosis not present

## 2023-08-12 DIAGNOSIS — I4719 Other supraventricular tachycardia: Secondary | ICD-10-CM | POA: Diagnosis present

## 2023-08-12 DIAGNOSIS — Z87891 Personal history of nicotine dependence: Secondary | ICD-10-CM

## 2023-08-12 DIAGNOSIS — I5021 Acute systolic (congestive) heart failure: Secondary | ICD-10-CM | POA: Diagnosis not present

## 2023-08-12 DIAGNOSIS — E66811 Obesity, class 1: Secondary | ICD-10-CM | POA: Diagnosis present

## 2023-08-12 DIAGNOSIS — R600 Localized edema: Secondary | ICD-10-CM | POA: Diagnosis not present

## 2023-08-12 DIAGNOSIS — Z820 Family history of epilepsy and other diseases of the nervous system: Secondary | ICD-10-CM

## 2023-08-12 DIAGNOSIS — Z8249 Family history of ischemic heart disease and other diseases of the circulatory system: Secondary | ICD-10-CM

## 2023-08-12 DIAGNOSIS — G9340 Encephalopathy, unspecified: Secondary | ICD-10-CM | POA: Diagnosis not present

## 2023-08-12 DIAGNOSIS — Z515 Encounter for palliative care: Secondary | ICD-10-CM | POA: Diagnosis not present

## 2023-08-12 DIAGNOSIS — L03115 Cellulitis of right lower limb: Secondary | ICD-10-CM | POA: Diagnosis not present

## 2023-08-12 DIAGNOSIS — R6510 Systemic inflammatory response syndrome (SIRS) of non-infectious origin without acute organ dysfunction: Principal | ICD-10-CM

## 2023-08-12 DIAGNOSIS — G9341 Metabolic encephalopathy: Secondary | ICD-10-CM | POA: Diagnosis not present

## 2023-08-12 DIAGNOSIS — Z789 Other specified health status: Secondary | ICD-10-CM | POA: Diagnosis not present

## 2023-08-12 DIAGNOSIS — E871 Hypo-osmolality and hyponatremia: Secondary | ICD-10-CM | POA: Diagnosis not present

## 2023-08-12 DIAGNOSIS — I2510 Atherosclerotic heart disease of native coronary artery without angina pectoris: Secondary | ICD-10-CM | POA: Diagnosis not present

## 2023-08-12 DIAGNOSIS — Z888 Allergy status to other drugs, medicaments and biological substances status: Secondary | ICD-10-CM

## 2023-08-12 DIAGNOSIS — J449 Chronic obstructive pulmonary disease, unspecified: Secondary | ICD-10-CM | POA: Diagnosis not present

## 2023-08-12 DIAGNOSIS — I10 Essential (primary) hypertension: Secondary | ICD-10-CM | POA: Diagnosis not present

## 2023-08-12 DIAGNOSIS — E872 Acidosis, unspecified: Secondary | ICD-10-CM | POA: Diagnosis present

## 2023-08-12 DIAGNOSIS — Z781 Physical restraint status: Secondary | ICD-10-CM

## 2023-08-12 DIAGNOSIS — I468 Cardiac arrest due to other underlying condition: Secondary | ICD-10-CM | POA: Diagnosis not present

## 2023-08-12 DIAGNOSIS — E870 Hyperosmolality and hypernatremia: Secondary | ICD-10-CM | POA: Diagnosis not present

## 2023-08-12 DIAGNOSIS — L0390 Cellulitis, unspecified: Secondary | ICD-10-CM | POA: Diagnosis not present

## 2023-08-12 DIAGNOSIS — I440 Atrioventricular block, first degree: Secondary | ICD-10-CM | POA: Diagnosis present

## 2023-08-12 DIAGNOSIS — I25810 Atherosclerosis of coronary artery bypass graft(s) without angina pectoris: Secondary | ICD-10-CM | POA: Diagnosis not present

## 2023-08-12 DIAGNOSIS — I471 Supraventricular tachycardia, unspecified: Secondary | ICD-10-CM | POA: Diagnosis not present

## 2023-08-12 DIAGNOSIS — I6782 Cerebral ischemia: Secondary | ICD-10-CM | POA: Diagnosis not present

## 2023-08-12 DIAGNOSIS — I5022 Chronic systolic (congestive) heart failure: Secondary | ICD-10-CM | POA: Diagnosis present

## 2023-08-12 DIAGNOSIS — I739 Peripheral vascular disease, unspecified: Secondary | ICD-10-CM | POA: Diagnosis present

## 2023-08-12 DIAGNOSIS — I959 Hypotension, unspecified: Secondary | ICD-10-CM | POA: Diagnosis not present

## 2023-08-12 DIAGNOSIS — R001 Bradycardia, unspecified: Secondary | ICD-10-CM | POA: Diagnosis not present

## 2023-08-12 DIAGNOSIS — I872 Venous insufficiency (chronic) (peripheral): Secondary | ICD-10-CM | POA: Diagnosis present

## 2023-08-12 DIAGNOSIS — R918 Other nonspecific abnormal finding of lung field: Secondary | ICD-10-CM | POA: Diagnosis not present

## 2023-08-12 DIAGNOSIS — R400 Somnolence: Secondary | ICD-10-CM | POA: Diagnosis not present

## 2023-08-12 DIAGNOSIS — R4182 Altered mental status, unspecified: Secondary | ICD-10-CM | POA: Diagnosis not present

## 2023-08-12 DIAGNOSIS — R54 Age-related physical debility: Secondary | ICD-10-CM | POA: Diagnosis present

## 2023-08-12 DIAGNOSIS — L03116 Cellulitis of left lower limb: Secondary | ICD-10-CM | POA: Diagnosis not present

## 2023-08-12 DIAGNOSIS — E878 Other disorders of electrolyte and fluid balance, not elsewhere classified: Secondary | ICD-10-CM | POA: Diagnosis present

## 2023-08-12 DIAGNOSIS — R0902 Hypoxemia: Secondary | ICD-10-CM | POA: Diagnosis not present

## 2023-08-12 DIAGNOSIS — J90 Pleural effusion, not elsewhere classified: Secondary | ICD-10-CM | POA: Diagnosis not present

## 2023-08-12 DIAGNOSIS — Z7982 Long term (current) use of aspirin: Secondary | ICD-10-CM

## 2023-08-12 LAB — COMPREHENSIVE METABOLIC PANEL
ALT: 11 U/L (ref 0–44)
AST: 20 U/L (ref 15–41)
Albumin: 3.7 g/dL (ref 3.5–5.0)
Alkaline Phosphatase: 64 U/L (ref 38–126)
Anion gap: 15 (ref 5–15)
BUN: 32 mg/dL — ABNORMAL HIGH (ref 8–23)
CO2: 28 mmol/L (ref 22–32)
Calcium: 8.9 mg/dL (ref 8.9–10.3)
Chloride: 77 mmol/L — ABNORMAL LOW (ref 98–111)
Creatinine, Ser: 2.21 mg/dL — ABNORMAL HIGH (ref 0.61–1.24)
GFR, Estimated: 30 mL/min — ABNORMAL LOW (ref >60)
Glucose, Bld: 134 mg/dL — ABNORMAL HIGH (ref 70–99)
Potassium: 2.5 mmol/L — CL (ref 3.5–5.1)
Sodium: 120 mmol/L — ABNORMAL LOW (ref 135–145)
Total Bilirubin: 0.7 mg/dL (ref 0.3–1.2)
Total Protein: 7.8 g/dL (ref 6.5–8.1)

## 2023-08-12 LAB — CBC WITH DIFFERENTIAL/PLATELET
Abs Immature Granulocytes: 0.03 10*3/uL (ref 0.00–0.07)
Basophils Absolute: 0.1 10*3/uL (ref 0.0–0.1)
Basophils Relative: 1 %
Eosinophils Absolute: 0.2 10*3/uL (ref 0.0–0.5)
Eosinophils Relative: 2 %
HCT: 33.4 % — ABNORMAL LOW (ref 39.0–52.0)
Hemoglobin: 12.4 g/dL — ABNORMAL LOW (ref 13.0–17.0)
Immature Granulocytes: 0 %
Lymphocytes Relative: 9 %
Lymphs Abs: 1 10*3/uL (ref 0.7–4.0)
MCH: 33.7 pg (ref 26.0–34.0)
MCHC: 37.1 g/dL — ABNORMAL HIGH (ref 30.0–36.0)
MCV: 90.8 fL (ref 80.0–100.0)
Monocytes Absolute: 0.8 10*3/uL (ref 0.1–1.0)
Monocytes Relative: 7 %
Neutro Abs: 8.9 10*3/uL — ABNORMAL HIGH (ref 1.7–7.7)
Neutrophils Relative %: 81 %
Platelets: 314 10*3/uL (ref 150–400)
RBC: 3.68 MIL/uL — ABNORMAL LOW (ref 4.22–5.81)
RDW: 12.6 % (ref 11.5–15.5)
WBC: 11 10*3/uL — ABNORMAL HIGH (ref 4.0–10.5)
nRBC: 0 % (ref 0.0–0.2)

## 2023-08-12 LAB — URINALYSIS, W/ REFLEX TO CULTURE (INFECTION SUSPECTED)
Bilirubin Urine: NEGATIVE
Glucose, UA: NEGATIVE mg/dL
Hgb urine dipstick: NEGATIVE
Ketones, ur: NEGATIVE mg/dL
Leukocytes,Ua: NEGATIVE
Nitrite: NEGATIVE
Protein, ur: NEGATIVE mg/dL
Specific Gravity, Urine: 1.012 (ref 1.005–1.030)
pH: 5 (ref 5.0–8.0)

## 2023-08-12 LAB — BASIC METABOLIC PANEL
Anion gap: 13 (ref 5–15)
BUN: 28 mg/dL — ABNORMAL HIGH (ref 8–23)
CO2: 25 mmol/L (ref 22–32)
Calcium: 8.3 mg/dL — ABNORMAL LOW (ref 8.9–10.3)
Chloride: 84 mmol/L — ABNORMAL LOW (ref 98–111)
Creatinine, Ser: 1.65 mg/dL — ABNORMAL HIGH (ref 0.61–1.24)
GFR, Estimated: 43 mL/min — ABNORMAL LOW (ref >60)
Glucose, Bld: 127 mg/dL — ABNORMAL HIGH (ref 70–99)
Potassium: 3.3 mmol/L — ABNORMAL LOW (ref 3.5–5.1)
Sodium: 122 mmol/L — ABNORMAL LOW (ref 135–145)

## 2023-08-12 LAB — BRAIN NATRIURETIC PEPTIDE: B Natriuretic Peptide: 48 pg/mL (ref 0.0–100.0)

## 2023-08-12 LAB — PROTIME-INR
INR: 0.9 (ref 0.8–1.2)
Prothrombin Time: 12.7 s (ref 11.4–15.2)

## 2023-08-12 LAB — LACTIC ACID, PLASMA
Lactic Acid, Venous: 3.6 mmol/L (ref 0.5–1.9)
Lactic Acid, Venous: 2.4 mmol/L (ref 0.5–1.9)

## 2023-08-12 LAB — TROPONIN I (HIGH SENSITIVITY): Troponin I (High Sensitivity): 7 ng/L (ref <18)

## 2023-08-12 LAB — MAGNESIUM: Magnesium: 1.9 mg/dL (ref 1.7–2.4)

## 2023-08-12 LAB — PROCALCITONIN: Procalcitonin: <0.1 ng/mL

## 2023-08-12 MED ORDER — SODIUM CHLORIDE 0.9 % IV BOLUS
500.0000 mL | Freq: Once | INTRAVENOUS | Status: AC
  Administered 2023-08-12: 500 mL via INTRAVENOUS

## 2023-08-12 MED ORDER — METRONIDAZOLE 500 MG PO TABS
500.0000 mg | ORAL_TABLET | Freq: Two times a day (BID) | ORAL | Status: DC
  Administered 2023-08-12 – 2023-08-13 (×4): 500 mg via ORAL
  Filled 2023-08-12 (×5): qty 1

## 2023-08-12 MED ORDER — ONDANSETRON HCL 4 MG PO TABS: 4.0000 mg | ORAL_TABLET | Freq: Four times a day (QID) | ORAL | Status: DC | PRN

## 2023-08-12 MED ORDER — CARVEDILOL 3.125 MG PO TABS
6.2500 mg | ORAL_TABLET | Freq: Every day | ORAL | Status: DC
  Administered 2023-08-13: 6.25 mg via ORAL
  Filled 2023-08-12: qty 2

## 2023-08-12 MED ORDER — IPRATROPIUM BROMIDE 0.02 % IN SOLN: 0.5000 mg | Freq: Four times a day (QID) | RESPIRATORY_TRACT | Status: DC

## 2023-08-12 MED ORDER — IPRATROPIUM BROMIDE 0.02 % IN SOLN: 0.5000 mg | Freq: Four times a day (QID) | RESPIRATORY_TRACT | Status: DC | PRN

## 2023-08-12 MED ORDER — INFLUENZA VAC A&B SURF ANT ADJ 0.5 ML IM SUSY
0.5000 mL | PREFILLED_SYRINGE | INTRAMUSCULAR | Status: AC
  Administered 2023-08-13: 0.5 mL via INTRAMUSCULAR
  Filled 2023-08-12: qty 0.5

## 2023-08-12 MED ORDER — POTASSIUM CHLORIDE 10 MEQ/100ML IV SOLN
10.0000 meq | INTRAVENOUS | Status: AC
  Administered 2023-08-12 (×4): 10 meq via INTRAVENOUS
  Filled 2023-08-12 (×4): qty 100

## 2023-08-12 MED ORDER — ACETAMINOPHEN 325 MG PO TABS: 650.0000 mg | ORAL_TABLET | Freq: Four times a day (QID) | ORAL | Status: DC | PRN

## 2023-08-12 MED ORDER — HEPARIN SODIUM (PORCINE) 5000 UNIT/ML IJ SOLN
5000.0000 [IU] | Freq: Three times a day (TID) | INTRAMUSCULAR | Status: DC
  Administered 2023-08-13 (×2): 5000 [IU] via SUBCUTANEOUS
  Filled 2023-08-12 (×2): qty 1

## 2023-08-12 MED ORDER — ROSUVASTATIN CALCIUM 20 MG PO TABS
40.0000 mg | ORAL_TABLET | Freq: Every day | ORAL | Status: DC
  Administered 2023-08-12 – 2023-08-13 (×2): 40 mg via ORAL
  Filled 2023-08-12 (×3): qty 2

## 2023-08-12 MED ORDER — LORAZEPAM 2 MG/ML IJ SOLN
1.0000 mg | Freq: Once | INTRAMUSCULAR | Status: AC
  Administered 2023-08-12: 1 mg via INTRAVENOUS
  Filled 2023-08-12: qty 1

## 2023-08-12 MED ORDER — OXYCODONE HCL 5 MG PO TABS: 5.0000 mg | ORAL_TABLET | ORAL | Status: DC | PRN

## 2023-08-12 MED ORDER — ASPIRIN 81 MG PO TBEC
81.0000 mg | DELAYED_RELEASE_TABLET | Freq: Every day | ORAL | Status: DC
  Administered 2023-08-13: 81 mg via ORAL
  Filled 2023-08-12 (×2): qty 1

## 2023-08-12 MED ORDER — ONDANSETRON HCL 4 MG/2ML IJ SOLN: 4.0000 mg | Freq: Four times a day (QID) | INTRAMUSCULAR | Status: DC | PRN

## 2023-08-12 MED ORDER — ACETAMINOPHEN 650 MG RE SUPP: 650.0000 mg | Freq: Four times a day (QID) | RECTAL | Status: DC | PRN

## 2023-08-12 MED ORDER — SODIUM CHLORIDE 0.9 % IV BOLUS
1000.0000 mL | Freq: Once | INTRAVENOUS | Status: AC
  Administered 2023-08-12: 1000 mL via INTRAVENOUS

## 2023-08-12 MED ORDER — CHLORHEXIDINE GLUCONATE CLOTH 2 % EX PADS
6.0000 | MEDICATED_PAD | Freq: Every day | CUTANEOUS | Status: DC
  Administered 2023-08-12 – 2023-08-20 (×9): 6 via TOPICAL

## 2023-08-12 MED ORDER — POTASSIUM CHLORIDE CRYS ER 20 MEQ PO TBCR
40.0000 meq | EXTENDED_RELEASE_TABLET | Freq: Once | ORAL | Status: AC
  Administered 2023-08-12: 40 meq via ORAL
  Filled 2023-08-12: qty 2

## 2023-08-12 MED ORDER — SODIUM CHLORIDE 0.9 % IV SOLN: INTRAVENOUS | Status: DC

## 2023-08-12 MED ORDER — SODIUM CHLORIDE 0.9 % IV SOLN
2.0000 g | INTRAVENOUS | Status: AC
  Administered 2023-08-12 – 2023-08-18 (×7): 2 g via INTRAVENOUS
  Filled 2023-08-12 (×7): qty 20

## 2023-08-12 NOTE — ED Provider Notes (Signed)
[place] EMERGENCY DEPARTMENT AT [facility] Provider Note   CSN: [id number] Arrival date & time: 08/31/2023  1449     History {Add pertinent medical, surgical, social history, OB history to HPI:1} Chief Complaint  Patient presents with   Fluid Overload (CHF)    David Mack is a 77 y.o. male.  He has a history of coronary disease, CHF, peripheral vascular disease.  He has had increased swelling and weeping of his legs right greater than left for the last 2 weeks.  He has been trying to elevate without much improvement.  He is on a diuretic.  He saw his primary care doctor today who recommended he come here for further evaluation.  He does not think he has a fever.  He said his breathing is about baseline for him.  No chest pain or abdominal pain.  The history is provided by the patient and the spouse.  Leg Pain Location:  Leg Leg location:  L lower leg and R lower leg Pain details:    Quality:  Aching   Severity:  Moderate   Onset quality:  Gradual   Duration:  2 weeks   Timing:  Constant   Progression:  Worsening Chronicity:  Recurrent Relieved by:  Nothing Worsened by:  Bearing weight and activity Ineffective treatments:  Rest and elevation Associated symptoms: swelling   Associated symptoms: no fever        Home Medications Prior to Admission medications   Medication Sig Start Date End Date Taking? Authorizing Provider  aspirin EC 81 MG tablet Take 1 tablet (81 mg total) by mouth daily. 05/06/19   Antoine Poche, MD  carvedilol (COREG) 6.25 MG tablet Take 1 tablet (6.25 mg total) by mouth 2 (two) times daily with a meal. 01/06/19   Dyann Kief, PA-C  guaiFENesin (MUCINEX) 600 MG 12 hr tablet Take 1,200 mg by mouth 2 (two) times daily.    [provider]  losartan (COZAAR) 25 MG tablet Take 1 tablet (25 mg total) by mouth daily. 09/27/19 12/26/19  Antoine Poche, MD  metolazone (ZAROXOLYN) 2.5 MG tablet Take 1 tablet (2.5 mg total) by  mouth daily as needed. 12/28/18 07/30/19  Dyann Kief, PA-C  mometasone-formoterol (DULERA) 200-5 MCG/ACT AERO Inhale 2 puffs into the lungs 2 (two) times daily. 11/19/18 07/30/19  [name], PA-C  potassium chloride SA (KLOR-CON) 20 MEQ tablet Take 1 tablet (20 mEq total) by mouth daily. 09/27/19   Antoine Poche, MD  rosuvastatin (CRESTOR) 20 MG tablet TAKE 1 TABLET EVERY EVENING 10/10/20   Antoine Poche, MD  torsemide (DEMADEX) 20 MG tablet TAKE 40 MG (2 TABLETS) DAILY. MAY TAKE AN EXTRA 20 MG DAILY AS NEEDED FOR SWELLING. 07/30/19   Antoine Poche, MD      Allergies    Albuterol and Pulmicort [budesonide]    Review of Systems   Review of Systems  Constitutional:  Negative for fever.  Respiratory:  Positive for shortness of breath (baseline).   Cardiovascular:  Negative for chest pain.  Gastrointestinal:  Negative for abdominal pain.  Skin:  Positive for color change, rash and wound.    Physical Exam Updated Vital Signs BP (!) 93/57   Pulse (!) 123   Temp 97.7 F (36.5 C) (Oral)   Resp 20   Ht 5\' 6"  (1.676 m)   Wt 95.3 kg   SpO2 97%   BMI 33.89 kg/m  Physical Exam Vitals and nursing note reviewed.  Constitutional:      General: He is not in acute distress.    Appearance: Normal appearance. He is well-developed.  HENT:     Head: Normocephalic and atraumatic.  Eyes:     Conjunctiva/sclera: Conjunctivae normal.  Cardiovascular:     Rate and Rhythm: Regular rhythm. Tachycardia present.     Heart sounds: No murmur heard. Pulmonary:     Effort: Pulmonary effort is normal. No respiratory distress.     Breath sounds: Normal breath sounds.  Abdominal:     Palpations: Abdomen is soft.     Tenderness: There is no abdominal tenderness. There is no guarding or rebound.  Musculoskeletal:     Cervical back: Neck supple.     Right lower leg: Edema present.     Left lower leg: Edema present.     Comments: He has bilateral lower extremity edema with some weeping  serous fluid from his right leg.  There is some surrounding warmth and erythema.  He has some excoriations on his right upper thigh.  Skin:    General: Skin is warm and dry.     Capillary Refill: Capillary refill takes less than 2 seconds.  Neurological:     General: No focal deficit present.     Mental Status: He is alert.     Motor: No weakness.     ED Results / Procedures / Treatments   Labs (all labs ordered are listed, but only abnormal results are displayed) Labs Reviewed  CULTURE, BLOOD (ROUTINE X 2)  CULTURE, BLOOD (ROUTINE X 2)  COMPREHENSIVE METABOLIC PANEL  LACTIC ACID, PLASMA  LACTIC ACID, PLASMA  CBC WITH DIFFERENTIAL/PLATELET  PROTIME-INR  URINALYSIS, W/ REFLEX TO CULTURE (INFECTION SUSPECTED)  BRAIN NATRIURETIC PEPTIDE  MAGNESIUM    EKG EKG Interpretation Date/Time:  Tuesday August 12 2023 15:00:47 EDT Ventricular Rate:  108 PR Interval:    QRS Duration:  106 QT Interval:  384 QTC Calculation: 514 R Axis:   -68  Text Interpretation: Undetermined rhythm Left axis deviation Anteroseptal infarct , age undetermined Abnormal ECG When compared with ECG of 14-Nov-2018 07:07, Significant changes have occurred Confirmed by Meridee Score [phone]) on 09/02/2023 3:08:36 PM  Radiology No results found.  Procedures Procedures  {Document cardiac monitor, telemetry assessment procedure when appropriate:1}  Medications Ordered in ED Medications  cefTRIAXone (ROCEPHIN) 2 g in sodium chloride 0.9 % 100 mL IVPB (has no administration in time range)    And  metroNIDAZOLE (FLAGYL) tablet 500 mg (has no administration in time range)    ED Course/ Medical Decision Making/ A&P   {   Click here for ABCD2, HEART and other calculatorsREFRESH Note before signing :1}                              Medical Decision Making Amount and/or Complexity of Data Reviewed Labs: ordered.  Risk Prescription drug management.   This patient complains of ***; this involves an  extensive number of treatment Options and is a complaint that carries with it a high risk of complications and morbidity. The differential includes ***  I ordered, reviewed and interpreted labs, which included *** I ordered medication *** and reviewed PMP when indicated. I ordered imaging studies which included *** and I independently    visualized and interpreted imaging which showed *** Additional history obtained from *** Previous records obtained and reviewed *** I consulted *** and discussed lab and imaging findings and discussed disposition.  Cardiac monitoring reviewed, *** Social determinants considered, *** Critical Interventions: ***  After the interventions stated above, I reevaluated the patient and found *** Admission and further testing considered, ***   {Document critical care time when appropriate:1} {Document review of labs and clinical decision tools ie heart score, Chads2Vasc2 etc:1}  {Document your independent review of radiology images, and any outside records:1} {Document your discussion with family members, caretakers, and with consultants:1} {Document social determinants of health affecting pt's care:1} {Document your decision making why or why not admission, treatments were needed:1} Final Clinical Impression(s) / ED Diagnoses Final diagnoses:  None    Rx / DC Orders ED Discharge Orders     None

## 2023-08-12 NOTE — ED Notes (Signed)
MD made aware of elevated HR of 120s

## 2023-08-12 NOTE — ED Triage Notes (Signed)
Pt sent from Dr. Robynn Pane in [place], [place] for evaluation of CHF, pt has bilateral leg swelling and cellulitis, Sepsis protocol initiated.

## 2023-08-12 NOTE — ED Notes (Signed)
Pt sitting in chair in room, states he is more comfortable in chair and typically sleeps in a chair at home

## 2023-08-12 NOTE — ED Notes (Signed)
Both sets of blood cultures drawn before any antibiotic administration  

## 2023-08-12 NOTE — ED Notes (Signed)
Bilateral lower extremity swelling with bilateral open, seeping, wounds on lower extremities

## 2023-08-12 NOTE — ED Notes (Signed)
Last BP 89/42 and HR 126, MD made aware

## 2023-08-13 ENCOUNTER — Inpatient Hospital Stay (HOSPITAL_COMMUNITY): Payer: Medicare Other

## 2023-08-13 DIAGNOSIS — N179 Acute kidney failure, unspecified: Secondary | ICD-10-CM | POA: Diagnosis not present

## 2023-08-13 DIAGNOSIS — I2510 Atherosclerotic heart disease of native coronary artery without angina pectoris: Secondary | ICD-10-CM

## 2023-08-13 DIAGNOSIS — I5022 Chronic systolic (congestive) heart failure: Secondary | ICD-10-CM | POA: Insufficient documentation

## 2023-08-13 DIAGNOSIS — I5021 Acute systolic (congestive) heart failure: Secondary | ICD-10-CM

## 2023-08-13 DIAGNOSIS — E876 Hypokalemia: Secondary | ICD-10-CM | POA: Diagnosis not present

## 2023-08-13 DIAGNOSIS — Z789 Other specified health status: Secondary | ICD-10-CM | POA: Insufficient documentation

## 2023-08-13 DIAGNOSIS — E871 Hypo-osmolality and hyponatremia: Secondary | ICD-10-CM

## 2023-08-13 DIAGNOSIS — R600 Localized edema: Secondary | ICD-10-CM | POA: Insufficient documentation

## 2023-08-13 LAB — FOLATE: Folate: 25.3 ng/mL (ref >5.9)

## 2023-08-13 LAB — CBC WITH DIFFERENTIAL/PLATELET
Abs Immature Granulocytes: 0.03 10*3/uL (ref 0.00–0.07)
Basophils Absolute: 0.1 10*3/uL (ref 0.0–0.1)
Basophils Relative: 1 %
Eosinophils Absolute: 0.4 10*3/uL (ref 0.0–0.5)
Eosinophils Relative: 5 %
HCT: 32.1 % — ABNORMAL LOW (ref 39.0–52.0)
Hemoglobin: 11.7 g/dL — ABNORMAL LOW (ref 13.0–17.0)
Immature Granulocytes: 0 %
Lymphocytes Relative: 8 %
Lymphs Abs: 0.6 10*3/uL — ABNORMAL LOW (ref 0.7–4.0)
MCH: 33.8 pg (ref 26.0–34.0)
MCHC: 36.4 g/dL — ABNORMAL HIGH (ref 30.0–36.0)
MCV: 92.8 fL (ref 80.0–100.0)
Monocytes Absolute: 0.8 10*3/uL (ref 0.1–1.0)
Monocytes Relative: 11 %
Neutro Abs: 5.6 10*3/uL (ref 1.7–7.7)
Neutrophils Relative %: 75 %
Platelets: 253 10*3/uL (ref 150–400)
RBC: 3.46 MIL/uL — ABNORMAL LOW (ref 4.22–5.81)
RDW: 12.9 % (ref 11.5–15.5)
WBC: 7.5 10*3/uL (ref 4.0–10.5)
nRBC: 0 % (ref 0.0–0.2)

## 2023-08-13 LAB — ECHOCARDIOGRAM COMPLETE
Area-P 1/2: 5.13 cm2
Calc EF: 50.7 %
Height: 66 in
S' Lateral: 3.4 cm
Single Plane A2C EF: 43.6 %
Single Plane A4C EF: 55.2 %
Weight: 3403.9 [oz_av]

## 2023-08-13 LAB — BLOOD GAS, ARTERIAL
Acid-Base Excess: 3.5 mmol/L — ABNORMAL HIGH (ref 0.0–2.0)
Bicarbonate: 27.8 mmol/L (ref 20.0–28.0)
Drawn by: 41977
O2 Saturation: 97.8 %
Patient temperature: 37
pCO2 arterial: 40 mm[Hg] (ref 32–48)
pH, Arterial: 7.45 (ref 7.35–7.45)
pO2, Arterial: 86 mm[Hg] (ref 83–108)

## 2023-08-13 LAB — COMPREHENSIVE METABOLIC PANEL
ALT: 12 U/L (ref 0–44)
AST: 18 U/L (ref 15–41)
Albumin: 3 g/dL — ABNORMAL LOW (ref 3.5–5.0)
Alkaline Phosphatase: 52 U/L (ref 38–126)
Anion gap: 7 (ref 5–15)
BUN: 24 mg/dL — ABNORMAL HIGH (ref 8–23)
CO2: 29 mmol/L (ref 22–32)
Calcium: 8.1 mg/dL — ABNORMAL LOW (ref 8.9–10.3)
Chloride: 87 mmol/L — ABNORMAL LOW (ref 98–111)
Creatinine, Ser: 1.25 mg/dL — ABNORMAL HIGH (ref 0.61–1.24)
GFR, Estimated: 60 mL/min — ABNORMAL LOW (ref >60)
Glucose, Bld: 123 mg/dL — ABNORMAL HIGH (ref 70–99)
Potassium: 2.7 mmol/L — CL (ref 3.5–5.1)
Sodium: 123 mmol/L — ABNORMAL LOW (ref 135–145)
Total Bilirubin: 0.6 mg/dL (ref 0.3–1.2)
Total Protein: 6.4 g/dL — ABNORMAL LOW (ref 6.5–8.1)

## 2023-08-13 LAB — BASIC METABOLIC PANEL
Anion gap: 11 (ref 5–15)
BUN: 13 mg/dL (ref 8–23)
CO2: 24 mmol/L (ref 22–32)
Calcium: 8.6 mg/dL — ABNORMAL LOW (ref 8.9–10.3)
Chloride: 92 mmol/L — ABNORMAL LOW (ref 98–111)
Creatinine, Ser: 0.93 mg/dL (ref 0.61–1.24)
GFR, Estimated: >60 mL/min (ref >60)
Glucose, Bld: 142 mg/dL — ABNORMAL HIGH (ref 70–99)
Potassium: 3.3 mmol/L — ABNORMAL LOW (ref 3.5–5.1)
Sodium: 127 mmol/L — ABNORMAL LOW (ref 135–145)

## 2023-08-13 LAB — VITAMIN B12: Vitamin B-12: 402 pg/mL (ref 180–914)

## 2023-08-13 LAB — IRON AND TIBC
Iron: 54 ug/dL (ref 45–182)
Saturation Ratios: 21 % (ref 17.9–39.5)
TIBC: 261 ug/dL (ref 250–450)
UIBC: 207 ug/dL

## 2023-08-13 LAB — MRSA NEXT GEN BY PCR, NASAL: MRSA by PCR Next Gen: NOT DETECTED

## 2023-08-13 LAB — LACTIC ACID, PLASMA: Lactic Acid, Venous: 1.1 mmol/L (ref 0.5–1.9)

## 2023-08-13 LAB — OSMOLALITY: Osmolality: 273 mosm/kg — ABNORMAL LOW (ref 275–295)

## 2023-08-13 LAB — VITAMIN D 25 HYDROXY (VIT D DEFICIENCY, FRACTURES): Vit D, 25-Hydroxy: 28.14 ng/mL — ABNORMAL LOW (ref 30–100)

## 2023-08-13 LAB — AMMONIA: Ammonia: 15 umol/L (ref 9–35)

## 2023-08-13 LAB — PHOSPHORUS: Phosphorus: 2.8 mg/dL (ref 2.5–4.6)

## 2023-08-13 LAB — TSH: TSH: 1.871 u[IU]/mL (ref 0.350–4.500)

## 2023-08-13 LAB — CORTISOL-PM, BLOOD: Cortisol - PM: 16.7 ug/dL — ABNORMAL HIGH (ref <10.0)

## 2023-08-13 LAB — MAGNESIUM: Magnesium: 1.9 mg/dL (ref 1.7–2.4)

## 2023-08-13 MED ORDER — FOLIC ACID 1 MG PO TABS
1.0000 mg | ORAL_TABLET | Freq: Every day | ORAL | Status: DC
  Administered 2023-08-13: 1 mg via ORAL
  Filled 2023-08-13 (×2): qty 1

## 2023-08-13 MED ORDER — GUAIFENESIN ER 600 MG PO TB12
600.0000 mg | ORAL_TABLET | Freq: Two times a day (BID) | ORAL | Status: DC
  Administered 2023-08-13 (×2): 600 mg via ORAL
  Filled 2023-08-13 (×4): qty 1

## 2023-08-13 MED ORDER — SODIUM CHLORIDE 0.9 % IV SOLN: INTRAVENOUS | Status: DC

## 2023-08-13 MED ORDER — ADULT MULTIVITAMIN W/MINERALS CH
1.0000 | ORAL_TABLET | Freq: Every day | ORAL | Status: DC
  Administered 2023-08-13: 1 via ORAL
  Filled 2023-08-13 (×2): qty 1

## 2023-08-13 MED ORDER — IPRATROPIUM BROMIDE 0.02 % IN SOLN
0.5000 mg | Freq: Four times a day (QID) | RESPIRATORY_TRACT | Status: DC
  Administered 2023-08-13 – 2023-08-18 (×21): 0.5 mg via RESPIRATORY_TRACT
  Filled 2023-08-13 (×21): qty 2.5

## 2023-08-13 MED ORDER — METOPROLOL TARTRATE 25 MG PO TABS
25.0000 mg | ORAL_TABLET | Freq: Two times a day (BID) | ORAL | Status: DC
  Administered 2023-08-13 (×2): 25 mg via ORAL
  Filled 2023-08-13 (×3): qty 1

## 2023-08-13 MED ORDER — ENOXAPARIN SODIUM 40 MG/0.4ML IJ SOSY
40.0000 mg | PREFILLED_SYRINGE | Freq: Every evening | INTRAMUSCULAR | Status: DC
  Administered 2023-08-13 – 2023-08-20 (×8): 40 mg via SUBCUTANEOUS
  Filled 2023-08-13 (×8): qty 0.4

## 2023-08-13 MED ORDER — POTASSIUM CHLORIDE 20 MEQ PO PACK
40.0000 meq | PACK | Freq: Once | ORAL | Status: AC
  Administered 2023-08-13: 40 meq via ORAL
  Filled 2023-08-13: qty 2

## 2023-08-13 MED ORDER — THIAMINE HCL 100 MG/ML IJ SOLN
100.0000 mg | Freq: Every day | INTRAMUSCULAR | Status: DC
  Administered 2023-08-16 – 2023-08-20 (×5): 100 mg via INTRAVENOUS
  Filled 2023-08-13 (×5): qty 2

## 2023-08-13 MED ORDER — LORAZEPAM 2 MG/ML IJ SOLN
1.0000 mg | INTRAMUSCULAR | Status: DC | PRN
  Administered 2023-08-13: 2 mg via INTRAVENOUS
  Administered 2023-08-13 (×3): 1 mg via INTRAVENOUS
  Administered 2023-08-14 (×3): 2 mg via INTRAVENOUS
  Filled 2023-08-13 (×8): qty 1

## 2023-08-13 MED ORDER — THIAMINE MONONITRATE 100 MG PO TABS
100.0000 mg | ORAL_TABLET | Freq: Every day | ORAL | Status: DC
  Administered 2023-08-13: 100 mg via ORAL
  Filled 2023-08-13 (×2): qty 1

## 2023-08-13 MED ORDER — LORAZEPAM 1 MG PO TABS: 1.0000 mg | ORAL_TABLET | ORAL | Status: DC | PRN

## 2023-08-13 MED ORDER — FLUTICASONE FUROATE-VILANTEROL 200-25 MCG/ACT IN AEPB
1.0000 | INHALATION_SPRAY | Freq: Every day | RESPIRATORY_TRACT | Status: DC
  Administered 2023-08-14 – 2023-08-19 (×5): 1 via RESPIRATORY_TRACT
  Filled 2023-08-13: qty 28

## 2023-08-13 MED ORDER — POTASSIUM CHLORIDE CRYS ER 20 MEQ PO TBCR
20.0000 meq | EXTENDED_RELEASE_TABLET | Freq: Once | ORAL | Status: AC
  Administered 2023-08-13: 20 meq via ORAL
  Filled 2023-08-13: qty 1

## 2023-08-13 MED ORDER — METOPROLOL TARTRATE 5 MG/5ML IV SOLN
5.0000 mg | Freq: Once | INTRAVENOUS | Status: AC
  Administered 2023-08-13: 5 mg via INTRAVENOUS
  Filled 2023-08-13: qty 5

## 2023-08-13 MED ORDER — DILTIAZEM HCL 25 MG/5ML IV SOLN
10.0000 mg | Freq: Four times a day (QID) | INTRAVENOUS | Status: DC | PRN
  Administered 2023-08-14: 10 mg via INTRAVENOUS
  Filled 2023-08-13: qty 5

## 2023-08-13 MED ORDER — HALOPERIDOL LACTATE 5 MG/ML IJ SOLN
2.0000 mg | Freq: Four times a day (QID) | INTRAMUSCULAR | Status: DC | PRN
  Administered 2023-08-13: 2 mg via INTRAVENOUS
  Filled 2023-08-13: qty 1

## 2023-08-13 MED ORDER — POTASSIUM CHLORIDE 10 MEQ/100ML IV SOLN
10.0000 meq | INTRAVENOUS | Status: AC
  Administered 2023-08-13 (×4): 10 meq via INTRAVENOUS
  Filled 2023-08-13 (×4): qty 100

## 2023-08-13 NOTE — H&P (Signed)
History and Physical    Patient: David Mack BJY:[id number] DOB: 09/17/1946 DOA: 08/24/2023 DOS: the patient was seen and examined on 08/13/2023 PCP: Toma Deiters, MD  Patient coming from: Home  Chief Complaint:  Chief Complaint  Patient presents with   Fluid Overload (CHF)   HPI: David Mack is a 77 y.o. male with medical history significant of COPD, alcohol use, hypertension, CHF, and more presents the ED with a chief complaint of fluid overload.  Patient reports that he has been retaining fluid in his right leg and his right leg has been weeping.  He reports has been retaining fluid in that leg for a long time but it got worse over the last 2 weeks.  He denies any pain or numbness in that leg.  There is associated erythema in that leg.  Patient is not sure when that started.  When the leg drains, it is clear fluid, no purulence.  He has dyspnea but it is no worse than his normal dyspnea.  He does not wear oxygen at home.  He has not had any chest pain, palpitations.  He denies orthopnea.  He reports compliance with his torsemide.  Patient was seen at an outpatient clinic and sent to the ER for cellulitis/CHF workup. - Patient does not smoke.  He does drink up to a sixpack of beer per day.  Patient would like to be DNR. Review of Systems: As mentioned in the history of present illness. All other systems reviewed and are negative. Past Medical History:  Diagnosis Date   Arthritis    "knees" (11/10/2018)   COPD (chronic obstructive pulmonary disease) (HCC)    Hypertension    Past Surgical History:  Procedure Laterality Date   CATARACT EXTRACTION W/PHACO Left 07/06/2015   Procedure: CATARACT EXTRACTION PHACO AND INTRAOCULAR LENS PLACEMENT (IOC);  Surgeon: Gemma Payor, MD;  Location: [facility];  Service: Ophthalmology;  Laterality: Left;  CDE 8.33   CATARACT EXTRACTION W/PHACO Right 08/21/2015   Procedure: CATARACT EXTRACTION PHACO AND INTRAOCULAR LENS PLACEMENT (IOC);  Surgeon: Gemma Payor, MD;  Location: [facility];  Service: Ophthalmology;  Laterality: Right;  CDE:9.89   CORONARY ARTERY BYPASS GRAFT N/A 11/13/2018   Procedure: CORONARY ARTERY BYPASS GRAFTING (CABG), ON PUMP, TIMES FOUR, USING LEFT INTERNAL MAMMARY ARTERY, AND BILATERAL GREATER SAPHENOUS VEIN HARVESTED ENDOSCOPICALLY;  Surgeon: Kerin Perna, MD;  Location: [facility] OR;  Service: Open Heart Surgery;  Laterality: N/A;   CORONARY ARTERY BYPASS GRAFT  11/13/2018   4 vessell   KNEE ARTHROSCOPY Right 2006   KNEE ARTHROSCOPY Left 2000   RIGHT HEART CATH N/A 11/12/2018   Procedure: RIGHT HEART CATH;  Surgeon: Laurey Morale, MD;  Location: [facility] INVASIVE CV LAB;  Service: Cardiovascular;  Laterality: N/A;   TEE WITHOUT CARDIOVERSION N/A 11/13/2018   Procedure: TRANSESOPHAGEAL ECHOCARDIOGRAM (TEE);  Surgeon: Donata Clay, Theron Arista, MD;  Location: [facility] OR;  Service: Open Heart Surgery;  Laterality: N/A;   Social History:  reports that he quit smoking about 4 years ago. His smoking use included cigarettes. He started smoking about 34 years ago. He has a 15 pack-year smoking history. He has never used smokeless tobacco. He reports that he does not currently use alcohol after a past usage of about 48.0 standard drinks of alcohol per week. He reports that he does not use drugs.  Allergies  Allergen Reactions   Albuterol Nausea And Vomiting and Other (See Comments)    Dizziness - unable to stand   Pulmicort [  Budesonide] Other (See Comments)    Makes very jittery and feels worse rather than better    Family History  Problem Relation Age of Onset   Alzheimer's disease Mother    Heart disease Mother        diagnosed in older age    Prior to Admission medications   Medication Sig Start Date End Date Taking? Authorizing Provider  aspirin EC 81 MG tablet Take 1 tablet (81 mg total) by mouth daily. 05/06/19  Yes BranchDorothe Pea, MD  ATROVENT HFA 17 MCG/ACT inhaler Inhale 1 puff into the lungs every 6 (six) hours as needed for wheezing.  06/20/23  Yes [provider]  carvedilol (COREG) 6.25 MG tablet Take 1 tablet (6.25 mg total) by mouth 2 (two) times daily with a meal. Patient taking differently: Take 6.25 mg by mouth daily. 01/06/19  Yes Dyann Kief, PA-C  diclofenac Sodium (VOLTAREN ARTHRITIS PAIN) 1 % GEL Apply 4 g topically every other day.   Yes [provider]  guaiFENesin (MUCINEX) 600 MG 12 hr tablet Take 1,200 mg by mouth 2 (two) times daily.   Yes [provider]  metolazone (ZAROXOLYN) 2.5 MG tablet Take 1 tablet (2.5 mg total) by mouth daily as needed. Patient taking differently: Take 2.5 mg by mouth every other day. 12/28/18 08/20/2023 Yes Dyann Kief, PA-C  polyethylene glycol (MIRALAX / GLYCOLAX) 17 g packet Take 17 g by mouth daily as needed for mild constipation.   Yes [provider]  potassium chloride SA (KLOR-CON) 20 MEQ tablet Take 1 tablet (20 mEq total) by mouth daily. 09/27/19  Yes BranchDorothe Pea, MD  rosuvastatin (CRESTOR) 40 MG tablet Take 40 mg by mouth at bedtime.   Yes [provider]  sacubitril-valsartan (ENTRESTO) 49-51 MG Take 1 tablet by mouth 2 (two) times daily.   Yes [provider]  torsemide (DEMADEX) 20 MG tablet TAKE 40 MG (2 TABLETS) DAILY. MAY TAKE AN EXTRA 20 MG DAILY AS NEEDED FOR SWELLING. Patient taking differently: Take 20 mg by mouth See admin instructions. TAKE 40 MG (2 TABLETS) DAILY. MAY TAKE AN EXTRA 20 MG DAILY AS NEEDED FOR SWELLING. 07/30/19  Yes Antoine Poche, MD    Physical Exam: Vitals:   2023/08/20 2000 08/20/23 2145 20-Aug-2023 2215 08/13/23 0000  BP: 96/66 115/70 (!) 146/73 105/66  Pulse: (!) 123 80 (!) 128 (!) 131  Resp: (!) 22 17 20  (!) 21  Temp:   (!) 97.3 F (36.3 C) 97.8 F (36.6 C)  TempSrc:   Oral Oral  SpO2: 99% 99% 100% 98%  Weight:   96.5 kg   Height:   5\' 6"  (1.676 m)    1.  General: Patient lying supine in bed,  no acute distress   2. Psychiatric: Alert and oriented x 3, mood and  behavior normal for situation, pleasant and cooperative with exam   3. Neurologic: Speech and language are normal, face is symmetric, moves all 4 extremities voluntarily, at baseline without acute deficits on limited exam   4. HEENMT:  Head is atraumatic, normocephalic, pupils reactive to light, neck is supple, trachea is midline, mucous membranes are moist   5. Respiratory : Lungs are clear to auscultation bilaterally without wheezing, rhonchi, rales, no cyanosis, no increase in work of breathing or accessory muscle use   6. Cardiovascular : Heart rate tachycardic, rhythm is regular, no murmurs, rubs or gallops, edema on the right lower extremity greater than left lower extremity, peripheral pulses  palpated   7. Gastrointestinal:  Abdomen is soft, nondistended, nontender to palpation bowel sounds active, no masses or organomegaly palpated   8. Skin:  Right leg is erythematous, edematous, with scabbing/excoriations   9.Musculoskeletal:  No acute deformities or trauma, no asymmetry in tone, edema on right lower extremity greater than left lower extremity, peripheral pulses palpated, no tenderness to palpation in the extremities  Data Reviewed: In the ED Temp 97.7, heart rate 87-1 34, respiratory rate 16-25, blood pressure 96/66, satting 97-99% Borderline leukocytosis at 11.0, hemoglobin 12.4 Chemistry reveals a hyponatremia 120, hypokalemia at 2.5, hypochloremia at 77 and elevated creatinine at 2.21 Patient has a lactic acidosis 3.6 and then 2.4 UA is not indicative of UTI Blood culture pending Chest x-ray shows cardiomegaly EKG shows a heart rate of 108, QTc 514 possibly junctional rhythm? Rocephin and Flagyl given in the ED 80 mEq of potassium given in the ED 2 L bolus given in the ED Assessment and Plan: * AKI (acute kidney injury) (HCC) - The most recent creatinine we have is from 2020 but was 0.87, today creatinine is 2.21 - After 2 L of fluid creatinine has improved to  1.65 - Patient also has a lactic acidosis 3.6, 2.4 - This is indicative of hypovolemic state - Continue IV fluids - Avoid nephrotoxic agents when possible - Trend creatinine in the a.m.  Hypokalemia - Trending up from 2.5 - Recheck on a.m. labs - Prolonged QTc - Monitor on telemetry  Alcohol use - Drinks up to a sixpack per day - CIWA protocol  Chronic systolic CHF (congestive heart failure) (HCC) - Continue aspirin and statin, beta-blocker holding Entresto due to soft pressures at presentation  Lower extremity edema - Sent from outpatient clinic for CHF workup - Chest x-ray shows cardiomegaly, but no pulmonary edema, patient denies orthopnea, patient denies worsening shortness of breath, asymmetrical lower extremity edema, BNP normal - There was concern for cellulitis so patient was treated with Rocephin and Flagyl, but procalcitonin is undetectable, mild leukocytosis at 11.0 - Continue antibiotics until blood cultures result - Unilateral edema right greater than left-check ultrasound DVT - Continue to monitor  Coronary artery disease - Continue aspirin, beta-blocker and statin - Holding Entresto due to soft pressures at presentation  Hyponatremia - Improved from 120>> 122 with 2 L of fluid - Continue hydration - Trend in the a.m.      Advance Care Planning:   Code Status: Full Code   Consults: None at this time  Family Communication: No family at bedside  Severity of Illness: The appropriate patient status for this patient is INPATIENT. Inpatient status is judged to be reasonable and necessary in order to provide the required intensity of service to ensure the patient's safety. The patient's presenting symptoms, physical exam findings, and initial radiographic and laboratory data in the context of their chronic comorbidities is felt to place them at high risk for further clinical deterioration. Furthermore, it is not anticipated that the patient will be medically  stable for discharge from the hospital within 2 midnights of admission.   * I certify that at the point of admission it is my clinical judgment that the patient will require inpatient hospital care spanning beyond 2 midnights from the point of admission due to high intensity of service, high risk for further deterioration and high frequency of surveillance required.*  Author: Lilyan Gilford, DO 08/13/2023 12:51 AM  For on call review [email or web address].

## 2023-08-13 NOTE — Progress Notes (Signed)
During bedside shift report at shift change, pt. Attempting to get out of bed. Pt. Is confused. Pt. Keeps saying, "I want a beer, I am going to go to the kitchen to get me a beer." Nurse assisted pt. In bed and reoriented pt.   Around 2000: bed alarm is going off, went in to check on pt., again pt. Attempting to get out of bed. Pt. Had pulled all his leads off, gown off, pulse oximetry off, and was attempting to pull at IV. Nurse adjusted pt. Into bed and applied all equipment while reorienting pt.

## 2023-08-13 NOTE — Plan of Care (Signed)
  Problem: Education: Goal: Knowledge of General Education information will improve Description: Including pain rating scale, medication(s)/side effects and non-pharmacologic comfort measures Outcome: Not Progressing   Problem: Health Behavior/Discharge Planning: Goal: Ability to manage health-related needs will improve Outcome: Progressing   Problem: Clinical Measurements: Goal: Ability to maintain clinical measurements within normal limits will improve Outcome: Progressing Goal: Will remain free from infection Outcome: Progressing Goal: Diagnostic test results will improve Outcome: Progressing Goal: Respiratory complications will improve Outcome: Progressing Goal: Cardiovascular complication will be avoided Outcome: Progressing   Problem: Activity: Goal: Risk for activity intolerance will decrease Outcome: Progressing   Problem: Nutrition: Goal: Adequate nutrition will be maintained Outcome: Not Progressing   Problem: Coping: Goal: Level of anxiety will decrease Outcome: Progressing   Problem: Elimination: Goal: Will not experience complications related to bowel motility Outcome: Progressing Goal: Will not experience complications related to urinary retention Outcome: Progressing   Problem: Pain Managment: Goal: General experience of comfort will improve Outcome: Progressing   Problem: Safety: Goal: Ability to remain free from injury will improve Outcome: Progressing   Problem: Skin Integrity: Goal: Risk for impaired skin integrity will decrease Outcome: Not Progressing

## 2023-08-13 NOTE — Plan of Care (Signed)

## 2023-08-13 NOTE — Progress Notes (Signed)
Pt. Still pulling off telemetry wires, taking off gown, and attempting to get out of bed. Multiple attempts made to reorient pt., pt. Not able to be reoriented no matter how many  attempts made; PRN anxiety/agitation meds given with no improvement. MD notified, MD ordered EKG, blood work, and non violent restraints. EKG done. Restraints applied. Lab came to draw labs. All pt. safety measures in place.

## 2023-08-13 NOTE — Plan of Care (Signed)

## 2023-08-13 NOTE — Assessment & Plan Note (Signed)
-   Improved from 120>> 122 with 2 L of fluid - Continue hydration - Trend in the a.m.

## 2023-08-13 NOTE — Progress Notes (Signed)
[facility] Progress Note  Patient: David Mack    ZOX:[id number]  DOA: August 25, 2023     Date of Service: the patient was seen and examined on 08/13/2023  Chief Complaint  Patient presents with   Fluid Overload (CHF)   Brief hospital course: David Mack is a 77 y.o. male with medical history significant of COPD, alcohol use, hypertension, CHF, and more presents the ED with a chief complaint of fluid overload.  Patient reports that he has been retaining fluid in his right leg and his right leg has been weeping.  He reports has been retaining fluid in that leg for a long time but it got worse over the last 2 weeks.  He denies any pain or numbness in that leg.  There is associated erythema in that leg.  Patient is not sure when that started.  When the leg drains, it is clear fluid, no purulence.  He has dyspnea but it is no worse than his normal dyspnea.  He does not wear oxygen at home.  He has not had any chest pain, palpitations.  He denies orthopnea.  He reports compliance with his torsemide.  Patient was seen at an outpatient clinic and sent to the ER for cellulitis/CHF workup. - Patient does not smoke.  He does drink up to a sixpack of beer per day.  Patient would like to be DNR.  Assessment and Plan:  AKI (acute kidney injury) (HCC) - The most recent creatinine we have is from 2020 but was 0.87, today creatinine is 2.21 - After 2 L of fluid creatinine has improved to 1.65 - Patient also has a lactic acidosis 3.6, 2.4 - This is indicative of hypovolemic state - Continue IV fluids - Avoid nephrotoxic agents when possible - Trend creatinine in the a.m.   Hypotonic hyponatremia Serum osmolality 273, slightly low - Improved from 120>> 122 with 2 L of fluid - Continue hydration - Trend in the a.m.  Hypokalemia, potassium repleted Magnesium within normal range - Recheck on a.m. labs - Prolonged QTc - Monitor on telemetry   Alcohol use - Drinks up to a sixpack per  day Continue thiamine 100 mg daily - CIWA protocol Use Haldol as needed for agitation   Chronic systolic CHF (congestive heart failure)  Prior echo July 2020 showed LVEF 55 to 60%, severely dilated bilateral atria, moderate pulmonary hypertension - Continue aspirin and statin,  holding Entresto and Coreg due to soft pressures at presentation Follow repeat 2D echocardiogram  Tachycardia, junctional rhythm Started Lopressor 25 mg p.o. twice daily Continue to monitor on telemetry Monitor electrolytes and replete   Lower extremity edema - Sent from outpatient clinic for CHF workup - Chest x-ray shows cardiomegaly, but no pulmonary edema, patient denies orthopnea, patient denies worsening shortness of breath, asymmetrical lower extremity edema, BNP normal - There was concern for cellulitis so patient was treated with Rocephin and Flagyl, but procalcitonin is undetectable, mild leukocytosis at 11.0 - Continue antibiotics until blood cultures result - Unilateral edema right greater than left-check ultrasound DVT - Continue to monitor   Coronary artery disease - Continue aspirin, beta-blocker and statin - Holding Entresto due to soft pressures at presentation   COPD exacerbation, continue DuoNeb every 6 hourly Started Breo Ellipta inhaler daily DC Lasix today milligram p.o. twice daily ABG reviewed, within normal range  Altered mental status, could be secondary to EtOH use Ammonia level 15 wnl Continue supportive care and fall precautions CT head negative for any acute findings  Body mass index is 34.34 kg/m.  Interventions:  Diet: Heart healthy diet DVT Prophylaxis: Subcutaneous Lovenox   Advance goals of care discussion: DNR/DNI  Family Communication: family was present at bedside, at the time of interview.  The pt provided permission to discuss medical plan with the family. Opportunity was given to ask question and all questions were answered satisfactorily.    Disposition:  Pt is from home, admitted with lower extremity edema, found to have hyponatremia, hypokalemia, still has electrolyte imbalance, which precludes a safe discharge. Discharge to home, when stable, may need few days to improve  Subjective: No significant events overnight, in the morning time patient was sleepy dozing off and confused, he was able to tell me his name and mumbles his date of birth which I did not understand.  He was moving all extremities spontaneously and he was able to follow command while waking up but dozing off.  Unable to offer any complaints.  Seems to be resting comfortably except shortness of breath. Later on patient's wife was concerned regarding altered mental status and wanted a stroke workup, CT head was done and it is negative for any acute findings.  We will continue current treatment as above.  Physical Exam: General: NAD, lying comfortably Appear in no distress, affect appropriate Eyes: PERRLA ENT: Oral Mucosa Clear, moist  Neck: no JVD,  Cardiovascular: S1 and S2 Present, no Murmur,  Respiratory: Equal air entry bilaterally, no crackles, bilateral wheezing audible. Abdomen: Bowel Sound present, Soft and no tenderness,  Skin: no rashes Extremities: 4+ Pedal edema, no calf tenderness, intact pressure wraps Neurologic: without any new focal findings, AMS, AO x 1, moving all extremities. Gait not checked due to patient safety concerns  Vitals:   08/13/23 1033 08/13/23 1044 08/13/23 1103 08/13/23 1150  BP: 119/85  131/63   Pulse: (!) 120  (!) 117 (!) 120  Resp: (!) 21  (!) 22   Temp:  (!) 97.4 F (36.3 C)    TempSrc:  Oral    SpO2: 99%  97%   Weight:      Height:        Intake/Output Summary (Last 24 hours) at 08/13/2023 1425 Last data filed at 08/13/2023 1115 Gross per 24 hour  Intake 1911.09 ml  Output --  Net 1911.09 ml   Filed Weights   2023-08-25 1457 August 25, 2023 2215 08/13/23 0455  Weight: 95.3 kg 96.5 kg 96.5 kg    Data  Reviewed: I have personally reviewed and interpreted daily labs, tele strips, imagings as discussed above. I reviewed all nursing notes, pharmacy notes, vitals, pertinent old records I have discussed plan of care as described above with RN and patient/family.  CBC: Recent Labs  Lab August 25, 2023 1531 08/13/23 0517  WBC 11.0* 7.5  NEUTROABS 8.9* 5.6  HGB 12.4* 11.7*  HCT 33.4* 32.1*  MCV 90.8 92.8  PLT 314 253   Basic Metabolic Panel: Recent Labs  Lab 08/25/23 1527 08/25/2023 1531 08/25/23 2119 08/13/23 0517  NA  --  120* 122* 123*  K  --  2.5* 3.3* 2.7*  CL  --  77* 84* 87*  CO2  --  28 25 29   GLUCOSE  --  134* 127* 123*  BUN  --  32* 28* 24*  CREATININE  --  2.21* 1.65* 1.25*  CALCIUM  --  8.9 8.3* 8.1*  MG  --  1.9  --  1.9  PHOS 2.8  --   --   --     Studies:  CT HEAD WO CONTRAST ( )  Result Date: 08/13/2023 CLINICAL DATA:  Mental status change of unknown cause EXAM: CT HEAD WITHOUT CONTRAST TECHNIQUE: Contiguous axial images were obtained from the base of the skull through the vertex without intravenous contrast. RADIATION DOSE REDUCTION: This exam was performed according to the departmental dose-optimization program which includes automated exposure control, adjustment of the mA and/or kV according to patient size and/or use of iterative reconstruction technique. COMPARISON:  None Available. FINDINGS: Brain: Generalized atrophy. Chronic small-vessel ischemic changes of the white matter. No sign of acute infarction, mass lesion, hemorrhage, hydrocephalus or extra-axial collection. Vascular: There is atherosclerotic calcification of the major vessels at the base of the brain. Skull: Negative Sinuses/Orbits: Clear/normal Other: None IMPRESSION: No acute finding by CT. Atrophy and chronic small-vessel ischemic changes of the white matter. Electronically Signed   By: Paulina Fusi M.D.   On: 08/13/2023 14:16   US Venous Img Lower Bilateral (DVT)  Result Date: 08/13/2023 CLINICAL  DATA:  Cellulitis of lower extremities. EXAM: BILATERAL LOWER EXTREMITY VENOUS DOPPLER ULTRASOUND TECHNIQUE: Gray-scale sonography with graded compression, as well as color Doppler and duplex ultrasound were performed to evaluate the lower extremity deep venous systems from the level of the common femoral vein and including the common femoral, femoral, profunda femoral, popliteal and calf veins including the posterior tibial, peroneal and gastrocnemius veins when visible. The superficial great saphenous vein was also interrogated. Spectral Doppler was utilized to evaluate flow at rest and with distal augmentation maneuvers in the common femoral, femoral and popliteal veins. COMPARISON:  None Available. FINDINGS: RIGHT LOWER EXTREMITY Common Femoral Vein: No evidence of thrombus. Normal compressibility, respiratory phasicity and response to augmentation. Saphenofemoral Junction: No evidence of thrombus. Normal compressibility and flow on color Doppler imaging. Profunda Femoral Vein: No evidence of thrombus. Normal compressibility and flow on color Doppler imaging. Femoral Vein: No evidence of thrombus. Normal compressibility, respiratory phasicity and response to augmentation. Popliteal Vein: No evidence of thrombus. Normal compressibility, respiratory phasicity and response to augmentation. Calf Veins: No evidence of thrombus. Normal compressibility and flow on color Doppler imaging. Superficial Great Saphenous Vein: No evidence of thrombus. Normal compressibility. Venous Reflux:  None. Other Findings:  None. LEFT LOWER EXTREMITY Common Femoral Vein: No evidence of thrombus. Normal compressibility, respiratory phasicity and response to augmentation. Saphenofemoral Junction: No evidence of thrombus. Normal compressibility and flow on color Doppler imaging. Profunda Femoral Vein: No evidence of thrombus. Normal compressibility and flow on color Doppler imaging. Femoral Vein: No evidence of thrombus. Normal  compressibility, respiratory phasicity and response to augmentation. Popliteal Vein: No evidence of thrombus. Normal compressibility, respiratory phasicity and response to augmentation. Calf Veins: No evidence of thrombus. Normal compressibility and flow on color Doppler imaging. Superficial Great Saphenous Vein: No evidence of thrombus. Normal compressibility. Venous Reflux:  None. Other Findings:  None. IMPRESSION: No evidence of deep venous thrombosis in either lower extremity. Electronically Signed   By: Lupita Raider M.D.   On: 08/13/2023 13:46   DG Chest Port 1 View  Result Date: 14-Aug-2023 CLINICAL DATA:  CHF. Bilateral leg swelling. Cellulitis. Sepsis protocol initiated. EXAM: PORTABLE CHEST 1 VIEW COMPARISON:  Chest radiographs 12/23/2018 and 12/14/2018; CT chest 11/10/2018 FINDINGS: Status post median sternotomy and CABG. Moderate atherosclerotic calcifications within the aortic arch. Cardiac silhouette is mildly to moderately enlarged, similar to prior. Mediastinal contours are within limits. There is again mild elevation of left hemidiaphragm. Left basilar horizontal linear scarring is unchanged from multiple prior radiographs and prior CT. No  acute airspace opacity. No pneumothorax. Moderate multilevel degenerative disc changes of the thoracic spine. IMPRESSION: 1. Unchanged mild left basilar scarring and mild elevation of left hemidiaphragm. No acute cardiopulmonary process. 2. Unchanged mild to moderate cardiomegaly. Electronically Signed   By: Neita Garnet M.D.   On: Aug 29, 2023 16:54    Scheduled Meds:  aspirin EC  81 mg Oral Daily   Chlorhexidine Gluconate Cloth  6 each Topical Daily   fluticasone furoate-vilanterol  1 puff Inhalation Daily   folic acid  1 mg Oral Daily   guaiFENesin  600 mg Oral BID   heparin  5,000 Units Subcutaneous Q8H   ipratropium  0.5 mg Nebulization Q6H   metroNIDAZOLE  500 mg Oral Q12H   multivitamin with minerals  1 tablet Oral Daily   rosuvastatin  40 mg  Oral QHS   thiamine  100 mg Oral Daily   Or   thiamine  100 mg Intravenous Daily   Continuous Infusions:  sodium chloride     cefTRIAXone (ROCEPHIN)  IV Stopped (Aug 29, 2023 1600)   PRN Meds: acetaminophen **OR** acetaminophen, diltiazem, haloperidol lactate, LORazepam **OR** LORazepam, ondansetron **OR** ondansetron (ZOFRAN) IV, oxyCODONE  Time spent: 55 minutes  Author: Gillis Santa. MD [facility] 08/13/2023 2:25 PM  To reach On-call, see care teams to locate the attending and reach out to them via [email or web address]. If 7PM-7AM, please contact night-coverage If you still have difficulty reaching the attending provider, please page the [facility] (Director on Call) for [facility] on amion for assistance.

## 2023-08-13 NOTE — Progress Notes (Signed)
*  PRELIMINARY RESULTS* Echocardiogram 2D Echocardiogram has been performed.  Stacey Drain 08/13/2023, 4:27 PM

## 2023-08-13 NOTE — TOC CM/SW Note (Signed)
Transition of Care [facility]) - Inpatient Brief Assessment   Patient Details  Name: David Mack MRN: [id number] Date of Birth: [birth year]  [facility] CM/SW Contact:    Villa Herb, LCSWA Phone Number: 08/13/2023, 9:55 AM   Clinical Narrative: TOC consulted for substance resources. CSW spoke with pts spouse who is at bedside with pt about interest in resources. At this time they are not interested and feel resources are not needed. No further needs noted at this time. Please consult TOC if needed for discharge needs.  Transition of Care Asessment: Insurance and Status: Insurance coverage has been reviewed Patient has primary care physician: Yes Home environment has been reviewed: from home Prior level of function:: independent Prior/Current Home Services: No current home services Social Determinants of Health Reivew: SDOH reviewed no interventions necessary Readmission risk has been reviewed: Yes Transition of care needs: no transition of care needs at this time

## 2023-08-13 NOTE — Assessment & Plan Note (Addendum)
-   Continue aspirin and statin, beta-blocker holding Entresto due to soft pressures at presentation

## 2023-08-13 NOTE — Assessment & Plan Note (Signed)
-   Drinks up to a sixpack per day - CIWA protocol

## 2023-08-13 NOTE — Assessment & Plan Note (Signed)
-   Sent from outpatient clinic for CHF workup - Chest x-ray shows cardiomegaly, but no pulmonary edema, patient denies orthopnea, patient denies worsening shortness of breath, asymmetrical lower extremity edema, BNP normal - There was concern for cellulitis so patient was treated with Rocephin and Flagyl, but procalcitonin is undetectable, mild leukocytosis at 11.0 - Continue antibiotics until blood cultures result - Unilateral edema right greater than left-check ultrasound DVT - Continue to monitor

## 2023-08-13 NOTE — Assessment & Plan Note (Addendum)
-   Trending up from 2.5 - Recheck on a.m. labs - Prolonged QTc - Monitor on telemetry

## 2023-08-13 NOTE — Assessment & Plan Note (Addendum)
-   Continue aspirin, beta-blocker and statin - Holding Entresto due to soft pressures at presentation

## 2023-08-13 NOTE — Assessment & Plan Note (Addendum)
-   The most recent creatinine we have is from 2020 but was 0.87, today creatinine is 2.21 - After 2 L of fluid creatinine has improved to 1.65 - Patient also has a lactic acidosis 3.6, 2.4 - This is indicative of hypovolemic state - Continue IV fluids - Avoid nephrotoxic agents when possible - Trend creatinine in the a.m.

## 2023-08-13 NOTE — Progress Notes (Signed)
Pt. Keeps attempting multiple times to get out of bed. Pt. Is unable to be reoriented. Nurse has made many verbal attempts with pt. Pt. Asked to use the toilet, assisted pt. To standing position, pt. Very unsteady on feet, asked co-worker for assistance and with 2 person assist, pt. Was able to use BSC. Assisted pt. Back to bed with safety measures in place: bed alarm on, fall mats in place, yellow fall risk bracelet on, non-skid socks placed back on pt. After pt. Removed them, bed in lowest position, call light within reach, and belongings/drink near bedside within pt.'s reach. Offered something to drink to pt. And pt. Received drink.

## 2023-08-13 NOTE — Consult Note (Addendum)
WOC Nurse Consult Note: this consult is performed remotely utilizing EMR records including photo documentation and chat with bedside nurse.  Patient being admitted for fluid overload with R leg edema>left  Reason for Consult: weeping R lower leg  Wound type: full thickness ? R/t fluid overload vs. venous insufficiency  Pressure Injury POA: NA  Measurement: see nursing flowsheet  Wound bed: 50% pink 50% yellow  Drainage (amount, consistency, odor) weeping serous fluid per notes  Periwound: edema, erythema (being treated for cellulitis), also dry hyperkeratotic skin  Dressing procedure/placement/frequency: Cleanse R lower leg ulcer with normal saline, dry and apply silver hydrofiber (Aquacel [place] [phone]) to wound bed daily, cover with ABD pad and wrap with Kerlix beginning just above toes and ending right below knee. May cover with Ace bandage wrapped in same fashion as Kerlix for light compression. MOISTEN SILVER WITH NS IF STUCK TO WOUND BED FOR ATRAUMATIC REMOVAL.   Patient would benefit from referral to wound care center/vascular for ongoing management of this ulcer and question of  venous insufficiency.   POC discussed with bedside nurse. WOC team will not follow at this time. Re-consult if further needs arise.   Thank you,    Priscella Mann MSN, RN-BC, [employer] [phone]

## 2023-08-14 ENCOUNTER — Inpatient Hospital Stay (HOSPITAL_COMMUNITY): Payer: Medicare Other

## 2023-08-14 DIAGNOSIS — N179 Acute kidney failure, unspecified: Secondary | ICD-10-CM | POA: Diagnosis not present

## 2023-08-14 LAB — BASIC METABOLIC PANEL
Anion gap: 13 (ref 5–15)
BUN: 12 mg/dL (ref 8–23)
CO2: 22 mmol/L (ref 22–32)
Calcium: 8.8 mg/dL — ABNORMAL LOW (ref 8.9–10.3)
Chloride: 92 mmol/L — ABNORMAL LOW (ref 98–111)
Creatinine, Ser: 0.86 mg/dL (ref 0.61–1.24)
GFR, Estimated: >60 mL/min (ref >60)
Glucose, Bld: 143 mg/dL — ABNORMAL HIGH (ref 70–99)
Potassium: 3.2 mmol/L — ABNORMAL LOW (ref 3.5–5.1)
Sodium: 127 mmol/L — ABNORMAL LOW (ref 135–145)

## 2023-08-14 LAB — CBC
HCT: 33.5 % — ABNORMAL LOW (ref 39.0–52.0)
Hemoglobin: 12.1 g/dL — ABNORMAL LOW (ref 13.0–17.0)
MCH: 33.6 pg (ref 26.0–34.0)
MCHC: 36.1 g/dL — ABNORMAL HIGH (ref 30.0–36.0)
MCV: 93.1 fL (ref 80.0–100.0)
Platelets: 314 10*3/uL (ref 150–400)
RBC: 3.6 MIL/uL — ABNORMAL LOW (ref 4.22–5.81)
RDW: 12.8 % (ref 11.5–15.5)
WBC: 10.8 10*3/uL — ABNORMAL HIGH (ref 4.0–10.5)
nRBC: 0 % (ref 0.0–0.2)

## 2023-08-14 LAB — MISC LABCORP TEST (SEND OUT): Labcorp test code: 4051

## 2023-08-14 LAB — PHOSPHORUS: Phosphorus: 1.7 mg/dL — ABNORMAL LOW (ref 2.5–4.6)

## 2023-08-14 LAB — GLUCOSE, CAPILLARY: Glucose-Capillary: 113 mg/dL — ABNORMAL HIGH (ref 70–99)

## 2023-08-14 LAB — MAGNESIUM: Magnesium: 1.8 mg/dL (ref 1.7–2.4)

## 2023-08-14 LAB — ACTH: C206 ACTH: 11.4 pg/mL (ref 7.2–63.3)

## 2023-08-14 MED ORDER — SODIUM CHLORIDE 0.9 % IV SOLN: INTRAVENOUS | Status: DC

## 2023-08-14 MED ORDER — LORAZEPAM 1 MG PO TABS: 1.0000 mg | ORAL_TABLET | ORAL | Status: AC | PRN

## 2023-08-14 MED ORDER — LORAZEPAM 2 MG/ML IJ SOLN: 1.0000 mg | Freq: Once | INTRAMUSCULAR | Status: AC

## 2023-08-14 MED ORDER — GLYCOPYRROLATE 0.2 MG/ML IJ SOLN
0.2000 mg | Freq: Three times a day (TID) | INTRAMUSCULAR | Status: DC | PRN
  Administered 2023-08-14 – 2023-08-19 (×3): 0.2 mg via INTRAVENOUS
  Filled 2023-08-14 (×3): qty 1

## 2023-08-14 MED ORDER — POTASSIUM CHLORIDE CRYS ER 20 MEQ PO TBCR: 40.0000 meq | EXTENDED_RELEASE_TABLET | Freq: Once | ORAL | Status: DC

## 2023-08-14 MED ORDER — METOPROLOL TARTRATE 5 MG/5ML IV SOLN
5.0000 mg | INTRAVENOUS | Status: DC | PRN
  Administered 2023-08-16 – 2023-08-20 (×17): 5 mg via INTRAVENOUS
  Filled 2023-08-14 (×17): qty 5

## 2023-08-14 MED ORDER — LORAZEPAM 2 MG/ML IJ SOLN: 2.0000 mg | Freq: Once | INTRAMUSCULAR | Status: DC

## 2023-08-14 MED ORDER — DEXMEDETOMIDINE HCL IN NACL 400 MCG/100ML IV SOLN
0.0000 ug/kg/h | INTRAVENOUS | Status: DC
  Administered 2023-08-14: 0.4 ug/kg/h via INTRAVENOUS
  Administered 2023-08-14: 0.2 ug/kg/h via INTRAVENOUS
  Administered 2023-08-15: 0.3 ug/kg/h via INTRAVENOUS
  Administered 2023-08-15 – 2023-08-16 (×3): 0.4 ug/kg/h via INTRAVENOUS
  Administered 2023-08-17 (×2): 0.5 ug/kg/h via INTRAVENOUS
  Administered 2023-08-17: 0.6 ug/kg/h via INTRAVENOUS
  Administered 2023-08-18: 0.7 ug/kg/h via INTRAVENOUS
  Administered 2023-08-18: 0.701 ug/kg/h via INTRAVENOUS
  Filled 2023-08-14 (×11): qty 100

## 2023-08-14 MED ORDER — LORAZEPAM 2 MG/ML IJ SOLN
1.0000 mg | INTRAMUSCULAR | Status: AC | PRN
  Administered 2023-08-14 – 2023-08-17 (×12): 2 mg via INTRAVENOUS
  Filled 2023-08-14 (×12): qty 1

## 2023-08-14 MED ORDER — SODIUM PHOSPHATES 45 MMOLE/15ML IV SOLN
30.0000 mmol | Freq: Once | INTRAVENOUS | Status: AC
  Administered 2023-08-14: 30 mmol via INTRAVENOUS
  Filled 2023-08-14: qty 10

## 2023-08-14 NOTE — Plan of Care (Signed)
  Problem: Education: Goal: Knowledge of General Education information will improve Description: Including pain rating scale, medication(s)/side effects and non-pharmacologic comfort measures Outcome: Not Progressing   Problem: Health Behavior/Discharge Planning: Goal: Ability to manage health-related needs will improve Outcome: Not Progressing   Problem: Clinical Measurements: Goal: Ability to maintain clinical measurements within normal limits will improve Outcome: Progressing Goal: Will remain free from infection Outcome: Progressing Goal: Diagnostic test results will improve Outcome: Progressing Goal: Respiratory complications will improve Outcome: Progressing Goal: Cardiovascular complication will be avoided Outcome: Progressing   Problem: Activity: Goal: Risk for activity intolerance will decrease Outcome: Not Progressing   Problem: Nutrition: Goal: Adequate nutrition will be maintained Outcome: Not Progressing   Problem: Coping: Goal: Level of anxiety will decrease Outcome: Progressing   Problem: Elimination: Goal: Will not experience complications related to bowel motility Outcome: Not Progressing Goal: Will not experience complications related to urinary retention Outcome: Progressing   Problem: Pain Managment: Goal: General experience of comfort will improve Outcome: Progressing   Problem: Safety: Goal: Ability to remain free from injury will improve Outcome: Progressing   Problem: Skin Integrity: Goal: Risk for impaired skin integrity will decrease Outcome: Progressing   Problem: Safety: Goal: Non-violent Restraint(s) Outcome: Progressing

## 2023-08-14 NOTE — Progress Notes (Addendum)
[facility] Progress Note  Patient: David Mack    ZOX:[id number]  DOA: 09/03/2023     Date of Service: the patient was seen and examined on 08/14/2023  Chief Complaint  Patient presents with   Fluid Overload (CHF)   Brief hospital course: LUCIAN SCHUENKE is a 77 y.o. male with medical history significant of COPD, alcohol use, hypertension, CHF, and more presents the ED with a chief complaint of fluid overload.  Patient reports that he has been retaining fluid in his right leg and his right leg has been weeping.  He reports has been retaining fluid in that leg for a long time but it got worse over the last 2 weeks.  He denies any pain or numbness in that leg.  There is associated erythema in that leg.  Patient is not sure when that started.  When the leg drains, it is clear fluid, no purulence.  He has dyspnea but it is no worse than his normal dyspnea.  He does not wear oxygen at home.  He has not had any chest pain, palpitations.  He denies orthopnea.  He reports compliance with his torsemide.  Patient was seen at an outpatient clinic and sent to the ER for cellulitis/CHF workup. - Patient does not smoke.  He does drink up to a sixpack of beer per day.  Patient would like to be DNR.  Assessment and Plan: 1)AKI----acute kidney injury --POA  -AKI due to dehydration and poor oral intake compounded by torsemide and Entresto use prior to admission creatinine on admission=2.21 on 08/26/2023 - baseline creatinine = 0.86 (2020)     -creatinine has normalized with hydration -Continue IV fluids especially due to lack of oral intake in the setting of agitation/delirium -renally adjust medications, avoid nephrotoxic agents / dehydration  / hypotension  2)Hypotonic hyponatremia/hypochloremia/hypokalemia -Partly due to beer potomania, compounded poor intake and torsemide use Serum osmolality 273, slightly low -Electrolytes improving with hydration and replacements - 3)HFrEF- -repeat echo from  08/13/2023 with EF of 50 to 55% without wall motion abnormalities, no diastolic dysfunction -BNP on admission is 48 -Chest x-ray on 08/15/2023 and repeat chest x-ray on 08/14/2023 without CHF concerns or other acute findings -Continue to hold Coreg, torsemide and Entresto for now due to soft BP and AKI  -May use IV metoprolol as needed tachycardia -Continue gentle hydration as above #1  4) delirium--suspect some component of DTs -Wife (retired Charity fundraiser) reports similar presentation back at [facility] in 2020 -Apparently prior to admission patient had cut way back on his alcohol usage -Hyponatremia and AKI could have been contributing to his delirium/mental status changes as well -Chest x-ray and Blood cultures Not suggestive of infection -Concerns  for possible cellulitis of the legs-currently on Abx as below in #5 -Patient remained quite agitated despite benzos -Low dose iv Precedex added on 08/14/2023 with improvement in behaviors/agitation -Wrist restraints -Continue multivitamin, thiamine and folic acid -Ammonia is not elevated -Continue lorazepam per CIWA protocol  5) lower extremity cellulitis--- please see photos in epic -Actually no pitting edema at this time--patient is actually dry with scaly skin -Lower extremity Dopplers without DVT -Echo and chest x-ray and BNP Not suggestive of acute CHF -Continue Rocephin -Stop Flagyl -May de-escalate to Keflex when awake enough to take oral intake   Coronary artery disease - Continue aspirin, beta-blocker and statin - Holding Entresto due to soft pressures at presentation   COPD exacerbation,--hold off on steroids given mental status concerns -Continue bronchodilators ABG reviewed, within  normal range  Body mass index is 34.34 kg/m.  Interventions:  CRITICAL CARE Performed by: Shon Hale   Total critical care time: 49 minutes  Critical care time was exclusive of separately billable procedures and treating other  patients.  Critical care was necessary to treat or prevent imminent or life-threatening deterioration. -Significant agitation and restlessness persisted despite IV lorazepam per protocol,  --low dose iv Precedex added on 08/14/2023 with improvement in behaviors/agitation  Critical care was time spent personally by me on the following activities: development of treatment plan with patient and/or surrogate as well as nursing, discussions with consultants, evaluation of patient's response to treatment, examination of patient, obtaining history from patient or surrogate, ordering and performing treatments and interventions, ordering and review of laboratory studies, ordering and review of radiographic studies, pulse oximetry and re-evaluation of patient's condition.   Diet: Heart healthy diet DVT Prophylaxis: Subcutaneous Lovenox   Advance goals of care discussion: DNR/DNI--- discussed with wife who confirms DNR/DNI status  Family Communication: -Discussed with wife who is a retired Char[employer] fundraiser at bedside, questions answered  Disposition:  -Having significant delirium/agitation at this time requiring IV Precedex and IV lorazepam  Subjective: No significant events overnight, in the morning time patient was sleepy dozing off and confused, he was able to tell me his name and mumbles his date of birth which I did not understand.  He was moving all extremities spontaneously and he was able to follow command while waking up but dozing off.  Unable to offer any complaints.  Seems to be resting comfortably except shortness of breath. Later on patient's wife was concerned regarding altered mental status and wanted a stroke workup, CT head was done and it is negative for any acute findings.  We will continue current treatment as above.  Physical Exam: General: NAD, lying comfortably Appear in no distress, affect appropriate Eyes: PERRLA ENT: Oral Mucosa Clear, moist  Neck: no JVD,  Cardiovascular: S1 and S2  Present, no Murmur,  Respiratory: Equal air entry bilaterally, no crackles, bilateral wheezing audible. Abdomen: Bowel Sound present, Soft and no tenderness,  Skin: no rashes Extremities: 4+ Pedal edema, no calf tenderness, intact pressure wraps Neurologic: without any new focal findings, AMS, AO x 1, moving all extremities. Gait not checked due to patient safety concerns  Vitals:   08/14/23 1600 08/14/23 1642 08/14/23 1712 08/14/23 1732  BP: (!) 115/55  (!) 97/43 91/67  Pulse: (!) 36  71 63  Resp: 18  19 17   Temp:  (!) 97.4 F (36.3 C)    TempSrc:  Oral    SpO2: (!) 88%  100% 100%  Weight:      Height:        Intake/Output Summary (Last 24 hours) at 08/14/2023 1815 Last data filed at 08/14/2023 1755 Gross per 24 hour  Intake 665.13 ml  Output --  Net 665.13 ml   Filed Weights   08/15/2023 2215 08/13/23 0455 08/14/23 0600  Weight: 96.5 kg 96.5 kg 96.5 kg    CBC: Recent Labs  Lab 08/20/2023 1531 08/13/23 0517 08/14/23 0324  WBC 11.0* 7.5 10.8*  NEUTROABS 8.9* 5.6  --   HGB 12.4* 11.7* 12.1*  HCT 33.4* 32.1* 33.5*  MCV 90.8 92.8 93.1  PLT 314 253 314   Basic Metabolic Panel: Recent Labs  Lab 08/18/2023 1527 08/27/2023 1531 09/01/2023 2119 08/13/23 0517 08/13/23 2249 08/14/23 0324  NA  --  120* 122* 123* 127* 127*  K  --  2.5* 3.3* 2.7* 3.3* 3.2*  CL  --  77* 84* 87* 92* 92*  CO2  --  28 25 29 24 22   GLUCOSE  --  134* 127* 123* 142* 143*  BUN  --  32* 28* 24* 13 12  CREATININE  --  2.21* 1.65* 1.25* 0.93 0.86  CALCIUM  --  8.9 8.3* 8.1* 8.6* 8.8*  MG  --  1.9  --  1.9  --  1.8  PHOS 2.8  --   --   --   --  1.7*    Studies: DG CHEST PORT 1 VIEW  Result Date: 08/14/2023 CLINICAL DATA:  Dyspnea. EXAM: PORTABLE CHEST 1 VIEW COMPARISON:  August 12, 2023. FINDINGS: Status post coronary artery bypass graft. Stable cardiomediastinal silhouette. Right lung is clear. Minimal left lingular subsegmental atelectasis is noted. Bony thorax is unremarkable. IMPRESSION: Minimal  left lingular subsegmental atelectasis. Electronically Signed   By: Lupita Raider M.D.   On: 08/14/2023 16:17    Scheduled Meds:  aspirin EC  81 mg Oral Daily   Chlorhexidine Gluconate Cloth  6 each Topical Daily   enoxaparin (LOVENOX) injection  40 mg Subcutaneous QPM   fluticasone furoate-vilanterol  1 puff Inhalation Daily   folic acid  1 mg Oral Daily   guaiFENesin  600 mg Oral BID   ipratropium  0.5 mg Nebulization Q6H   LORazepam  2 mg Intravenous Once   metoprolol tartrate  25 mg Oral BID   metroNIDAZOLE  500 mg Oral Q12H   multivitamin with minerals  1 tablet Oral Daily   potassium chloride  40 mEq Oral Once   rosuvastatin  40 mg Oral QHS   thiamine  100 mg Oral Daily   Or   thiamine  100 mg Intravenous Daily   Continuous Infusions:  sodium chloride     cefTRIAXone (ROCEPHIN)  IV 200 mL/hr at 08/14/23 1755   dexmedetomidine (PRECEDEX) IV infusion 0.2 mcg/kg/hr (08/14/23 1755)   PRN Meds: acetaminophen **OR** acetaminophen, diltiazem, haloperidol lactate, LORazepam **OR** LORazepam, ondansetron **OR** ondansetron (ZOFRAN) IV, oxyCODONE  Author: Shon Hale, MD  [facility] 08/14/2023 6:15 PM  To reach On-call, see care teams to locate the attending and reach out to them via [email or web address]. If 7PM-7AM, please contact night-coverage If you still have difficulty reaching the attending provider, please page the [facility] (Director on Call) for [facility] on amion for assistance.

## 2023-08-15 DIAGNOSIS — N179 Acute kidney failure, unspecified: Secondary | ICD-10-CM | POA: Diagnosis not present

## 2023-08-15 LAB — CBC
HCT: 35.3 % — ABNORMAL LOW (ref 39.0–52.0)
Hemoglobin: 11.9 g/dL — ABNORMAL LOW (ref 13.0–17.0)
MCH: 32.4 pg (ref 26.0–34.0)
MCHC: 33.7 g/dL (ref 30.0–36.0)
MCV: 96.2 fL (ref 80.0–100.0)
Platelets: 278 10*3/uL (ref 150–400)
RBC: 3.67 MIL/uL — ABNORMAL LOW (ref 4.22–5.81)
RDW: 12.9 % (ref 11.5–15.5)
WBC: 7.7 10*3/uL (ref 4.0–10.5)
nRBC: 0 % (ref 0.0–0.2)

## 2023-08-15 LAB — RENAL FUNCTION PANEL
Albumin: 3.2 g/dL — ABNORMAL LOW (ref 3.5–5.0)
Anion gap: 10 (ref 5–15)
BUN: 7 mg/dL — ABNORMAL LOW (ref 8–23)
CO2: 25 mmol/L (ref 22–32)
Calcium: 8.5 mg/dL — ABNORMAL LOW (ref 8.9–10.3)
Chloride: 99 mmol/L (ref 98–111)
Creatinine, Ser: 0.82 mg/dL (ref 0.61–1.24)
GFR, Estimated: >60 mL/min (ref >60)
Glucose, Bld: 104 mg/dL — ABNORMAL HIGH (ref 70–99)
Phosphorus: 2.4 mg/dL — ABNORMAL LOW (ref 2.5–4.6)
Potassium: 3.3 mmol/L — ABNORMAL LOW (ref 3.5–5.1)
Sodium: 134 mmol/L — ABNORMAL LOW (ref 135–145)

## 2023-08-15 LAB — GLUCOSE, CAPILLARY
Glucose-Capillary: 103 mg/dL — ABNORMAL HIGH (ref 70–99)
Glucose-Capillary: 103 mg/dL — ABNORMAL HIGH (ref 70–99)
Glucose-Capillary: 105 mg/dL — ABNORMAL HIGH (ref 70–99)
Glucose-Capillary: 97 mg/dL (ref 70–99)
Glucose-Capillary: 98 mg/dL (ref 70–99)

## 2023-08-15 MED ORDER — SODIUM CHLORIDE 0.9 % IV SOLN: INTRAVENOUS | Status: DC

## 2023-08-15 MED ORDER — POTASSIUM PHOSPHATES 15 MMOLE/5ML IV SOLN
15.0000 mmol | Freq: Once | INTRAVENOUS | Status: AC
  Administered 2023-08-15: 15 mmol via INTRAVENOUS
  Filled 2023-08-15: qty 5

## 2023-08-15 NOTE — Progress Notes (Signed)
[facility] Progress Note  Patient: David Mack    XBJ:[id number]  DOA: 08/18/2023     Date of Service: the patient was seen and examined on 08/15/2023  Chief Complaint  Patient presents with   Fluid Overload (CHF)   Brief hospital course: ANTAR MILKS is a 77 y.o. male with medical history significant of COPD, alcohol use, hypertension, CHF, and more presents the ED with a chief complaint of fluid overload.  Patient reports that he has been retaining fluid in his right leg and his right leg has been weeping.  He reports has been retaining fluid in that leg for a long time but it got worse over the last 2 weeks.  He denies any pain or numbness in that leg.  There is associated erythema in that leg.  Patient is not sure when that started.  When the leg drains, it is clear fluid, no purulence.  He has dyspnea but it is no worse than his normal dyspnea.  He does not wear oxygen at home.  He has not had any chest pain, palpitations.  He denies orthopnea.  He reports compliance with his torsemide.  Patient was seen at an outpatient clinic and sent to the ER for cellulitis/CHF workup. - Patient does not smoke.  He does drink up to a sixpack of beer per day.  Patient would like to be DNR.  Assessment and Plan: 1)AKI----acute kidney injury --POA  -AKI due to dehydration and poor oral intake compounded by torsemide and Entresto use prior to admission creatinine on admission=2.21 on 08/22/2023 - baseline creatinine = 0.86 (2020)     -creatinine has normalized with hydration -Continue IV fluids especially due to lack of oral intake in the setting of agitation/delirium -renally adjust medications, avoid nephrotoxic agents / dehydration  / hypotension  2)Hypotonic hyponatremia/hypochloremia -Partly due to beer potomania, compounded poor intake and torsemide use Serum osmolality 273, slightly low -Electrolytes improving with hydration and replacements Na--120 >>123 >>127>>134 -Chloride is  normalized  3)HFrEF- -repeat echo from 08/13/2023 with EF of 50 to 55% without wall motion abnormalities, no diastolic dysfunction -BNP on admission is 48 -Chest x-ray on 08/18/2023 and repeat chest x-ray on 08/14/2023 without CHF concerns or other acute findings -Continue to hold Coreg, torsemide and Entresto for now due to soft BP and AKI  -May use IV metoprolol as needed tachycardia -Continue gentle hydration as above #1  4) delirium--suspect some component of DTs -Wife (retired Charity fundraiser) reports similar presentation back at [facility] in 2020 -Apparently prior to admission patient had cut way back on his alcohol usage -Hyponatremia and AKI could have been contributing to his delirium/mental status changes as well -Chest x-ray and Blood cultures Not suggestive of infection -Concerns  for possible cellulitis of the legs-currently on Abx as below in #5 -Patient remained quite agitated despite benzos C/n  iv Precedex for delirium/behavioral issues and agitation  -Wrist restraints -Continue multivitamin, thiamine and folic acid -Ammonia is not elevated -Continue lorazepam per CIWA protocol  5) lower extremity cellulitis--- please see photos in epic -Actually no significant pitting edema at this time--patient is actually dry with scaly skin -Lower extremity Dopplers without DVT -Echo and chest x-ray and BNP Not suggestive of acute CHF -Continue Rocephin -Stopped Flagyl WBC 11.0 >>10.8 >>7.7 -May de-escalate to Keflex when awake enough to take oral intake  6) hypokalemia/hypophosphatemia--replace and recheck electrolytes   Coronary artery disease - Continue aspirin, beta-blocker and statin--when awake enough to take oral medications - [employer]  1531 August 22, 2023 2119 08/13/23 0517 08/13/23 2249 08/14/23 0324 08/15/23 0621  NA  --  120*   < > 122* 123* 127* 127* 134*  K  --  2.5*   < > 3.3* 2.7* 3.3* 3.2* 3.3*  CL  --  77*   < > 84* 87* 92* 92* 99  CO2  --  28   < > 25 29 24 22 25   GLUCOSE  --  134*   < > 127* 123* 142* 143* 104*  BUN  --  32*   < > 28* 24* 13 12 7*  CREATININE  --  2.21*   < > 1.65* 1.25* 0.93 0.86 0.82  CALCIUM  --  8.9   < > 8.3* 8.1* 8.6* 8.8* 8.5*  MG  --  1.9  --   --  1.9  --  1.8  --   PHOS 2.8  --   --   --   --   --  1.7*  2.4*   < > = values in this interval not displayed.    Studies: No results found.  Scheduled Meds:  aspirin EC  81 mg Oral Daily   Chlorhexidine Gluconate Cloth  6 each Topical Daily   enoxaparin (LOVENOX) injection  40 mg Subcutaneous QPM   fluticasone furoate-vilanterol  1 puff Inhalation Daily   folic acid  1 mg Oral Daily   guaiFENesin  600 mg Oral BID   ipratropium  0.5 mg Nebulization Q6H   LORazepam  2 mg Intravenous Once   multivitamin with minerals  1 tablet Oral Daily   potassium chloride  40 mEq Oral Once   rosuvastatin  40 mg Oral QHS   thiamine  100 mg Oral Daily   Or   thiamine  100 mg Intravenous Daily   Continuous Infusions:  sodium chloride 100 mL/hr at 08/15/23 1500   cefTRIAXone (ROCEPHIN)  IV 200 mL/hr at 08/15/23 1435   dexmedetomidine (PRECEDEX) IV infusion 0.4 mcg/kg/hr (08/15/23 1435)   potassium PHOSPHATE IVPB (in mmol)     PRN Meds: acetaminophen **OR** acetaminophen, glycopyrrolate, LORazepam **OR** LORazepam, metoprolol tartrate, ondansetron **OR** ondansetron (ZOFRAN) IV, oxyCODONE  Author: Shon Hale, MD  [facility] 08/15/2023 4:29 PM  To reach On-call, see care teams to locate the attending and reach out to them via [email or web address]. If 7PM-7AM, please contact night-coverage If you still have difficulty reaching the attending provider, please page the [facility] (Director on Call) for [facility] on amion for assistance.  [facility] Progress Note  Patient: David Mack    XBJ:[id number]  DOA: 08/18/2023     Date of Service: the patient was seen and examined on 08/15/2023  Chief Complaint  Patient presents with   Fluid Overload (CHF)   Brief hospital course: ANTAR MILKS is a 77 y.o. male with medical history significant of COPD, alcohol use, hypertension, CHF, and more presents the ED with a chief complaint of fluid overload.  Patient reports that he has been retaining fluid in his right leg and his right leg has been weeping.  He reports has been retaining fluid in that leg for a long time but it got worse over the last 2 weeks.  He denies any pain or numbness in that leg.  There is associated erythema in that leg.  Patient is not sure when that started.  When the leg drains, it is clear fluid, no purulence.  He has dyspnea but it is no worse than his normal dyspnea.  He does not wear oxygen at home.  He has not had any chest pain, palpitations.  He denies orthopnea.  He reports compliance with his torsemide.  Patient was seen at an outpatient clinic and sent to the ER for cellulitis/CHF workup. - Patient does not smoke.  He does drink up to a sixpack of beer per day.  Patient would like to be DNR.  Assessment and Plan: 1)AKI----acute kidney injury --POA  -AKI due to dehydration and poor oral intake compounded by torsemide and Entresto use prior to admission creatinine on admission=2.21 on 08/22/2023 - baseline creatinine = 0.86 (2020)     -creatinine has normalized with hydration -Continue IV fluids especially due to lack of oral intake in the setting of agitation/delirium -renally adjust medications, avoid nephrotoxic agents / dehydration  / hypotension  2)Hypotonic hyponatremia/hypochloremia -Partly due to beer potomania, compounded poor intake and torsemide use Serum osmolality 273, slightly low -Electrolytes improving with hydration and replacements Na--120 >>123 >>127>>134 -Chloride is  normalized  3)HFrEF- -repeat echo from 08/13/2023 with EF of 50 to 55% without wall motion abnormalities, no diastolic dysfunction -BNP on admission is 48 -Chest x-ray on 08/18/2023 and repeat chest x-ray on 08/14/2023 without CHF concerns or other acute findings -Continue to hold Coreg, torsemide and Entresto for now due to soft BP and AKI  -May use IV metoprolol as needed tachycardia -Continue gentle hydration as above #1  4) delirium--suspect some component of DTs -Wife (retired Charity fundraiser) reports similar presentation back at [facility] in 2020 -Apparently prior to admission patient had cut way back on his alcohol usage -Hyponatremia and AKI could have been contributing to his delirium/mental status changes as well -Chest x-ray and Blood cultures Not suggestive of infection -Concerns  for possible cellulitis of the legs-currently on Abx as below in #5 -Patient remained quite agitated despite benzos C/n  iv Precedex for delirium/behavioral issues and agitation  -Wrist restraints -Continue multivitamin, thiamine and folic acid -Ammonia is not elevated -Continue lorazepam per CIWA protocol  5) lower extremity cellulitis--- please see photos in epic -Actually no significant pitting edema at this time--patient is actually dry with scaly skin -Lower extremity Dopplers without DVT -Echo and chest x-ray and BNP Not suggestive of acute CHF -Continue Rocephin -Stopped Flagyl WBC 11.0 >>10.8 >>7.7 -May de-escalate to Keflex when awake enough to take oral intake  6) hypokalemia/hypophosphatemia--replace and recheck electrolytes   Coronary artery disease - Continue aspirin, beta-blocker and statin--when awake enough to take oral medications - [employer]

## 2023-08-16 DIAGNOSIS — N179 Acute kidney failure, unspecified: Secondary | ICD-10-CM | POA: Diagnosis not present

## 2023-08-16 LAB — GLUCOSE, CAPILLARY
Glucose-Capillary: 101 mg/dL — ABNORMAL HIGH (ref 70–99)
Glucose-Capillary: 93 mg/dL (ref 70–99)
Glucose-Capillary: 91 mg/dL (ref 70–99)

## 2023-08-16 LAB — CBC
HCT: 34.6 % — ABNORMAL LOW (ref 39.0–52.0)
Hemoglobin: 11.9 g/dL — ABNORMAL LOW (ref 13.0–17.0)
MCH: 33.3 pg (ref 26.0–34.0)
MCHC: 34.4 g/dL (ref 30.0–36.0)
MCV: 96.9 fL (ref 80.0–100.0)
Platelets: 305 10*3/uL (ref 150–400)
RBC: 3.57 MIL/uL — ABNORMAL LOW (ref 4.22–5.81)
RDW: 13.2 % (ref 11.5–15.5)
WBC: 8.9 10*3/uL (ref 4.0–10.5)
nRBC: 0 % (ref 0.0–0.2)

## 2023-08-16 LAB — BASIC METABOLIC PANEL
Anion gap: 11 (ref 5–15)
BUN: 5 mg/dL — ABNORMAL LOW (ref 8–23)
CO2: 23 mmol/L (ref 22–32)
Calcium: 8.4 mg/dL — ABNORMAL LOW (ref 8.9–10.3)
Chloride: 105 mmol/L (ref 98–111)
Creatinine, Ser: 0.78 mg/dL (ref 0.61–1.24)
GFR, Estimated: >60 mL/min (ref >60)
Glucose, Bld: 100 mg/dL — ABNORMAL HIGH (ref 70–99)
Potassium: 3.4 mmol/L — ABNORMAL LOW (ref 3.5–5.1)
Sodium: 139 mmol/L (ref 135–145)

## 2023-08-16 MED ORDER — ORAL CARE MOUTH RINSE: 15.0000 mL | OROMUCOSAL | Status: DC | PRN

## 2023-08-16 MED ORDER — POTASSIUM CHLORIDE 10 MEQ/100ML IV SOLN
10.0000 meq | INTRAVENOUS | Status: AC
  Administered 2023-08-16 (×4): 10 meq via INTRAVENOUS
  Filled 2023-08-16 (×4): qty 100

## 2023-08-16 MED ORDER — SODIUM CHLORIDE 0.9 % IV SOLN: INTRAVENOUS | Status: DC

## 2023-08-16 MED ORDER — ORAL CARE MOUTH RINSE
15.0000 mL | OROMUCOSAL | Status: DC
  Administered 2023-08-16 (×2): 15 mL via OROMUCOSAL

## 2023-08-16 NOTE — Plan of Care (Signed)
  Problem: Education: Goal: Knowledge of General Education information will improve Description: Including pain rating scale, medication(s)/side effects and non-pharmacologic comfort measures 08/16/2023 1749 by Theodosia Blender, RN Outcome: Not Progressing 08/16/2023 1749 by Theodosia Blender, RN Outcome: Not Progressing   Problem: Clinical Measurements: Goal: Ability to maintain clinical measurements within normal limits will improve 08/16/2023 1749 by Theodosia Blender, RN Outcome: Not Progressing 08/16/2023 1749 by Theodosia Blender, RN Outcome: Not Progressing Goal: Will remain free from infection 08/16/2023 1749 by Theodosia Blender, RN Outcome: Progressing 08/16/2023 1749 by Theodosia Blender, RN Outcome: Not Progressing Goal: Diagnostic test results will improve 08/16/2023 1749 by Theodosia Blender, RN Outcome: Progressing 08/16/2023 1749 by Theodosia Blender, RN Outcome: Not Progressing Goal: Respiratory complications will improve 08/16/2023 1749 by Theodosia Blender, RN Outcome: Not Progressing 08/16/2023 1749 by Theodosia Blender, RN Outcome: Not Progressing Goal: Cardiovascular complication will be avoided 08/16/2023 1749 by Theodosia Blender, RN Outcome: Progressing 08/16/2023 1749 by Theodosia Blender, RN Outcome: Not Progressing   Problem: Activity: Goal: Risk for activity intolerance will decrease 08/16/2023 1749 by Theodosia Blender, RN Outcome: Not Progressing 08/16/2023 1749 by Theodosia Blender, RN Outcome: Not Progressing

## 2023-08-16 NOTE — Progress Notes (Signed)
[facility] Progress Note  Patient: David Mack    WUJ:[id number]  DOA: August 22, 2023     Date of Service: the patient was seen and examined on 08/16/2023  Chief Complaint  Patient presents with   Fluid Overload (CHF)   Brief hospital course: ZACKREY SCHLICHER is a 77 y.o. male with medical history significant of COPD, alcohol use, hypertension, CHF, and more presents the ED with a chief complaint of fluid overload.  Patient reports that he has been retaining fluid in his right leg and his right leg has been weeping.  He reports has been retaining fluid in that leg for a long time but it got worse over the last 2 weeks.  He denies any pain or numbness in that leg.  There is associated erythema in that leg.  Patient is not sure when that started.  When the leg drains, it is clear fluid, no purulence.  He has dyspnea but it is no worse than his normal dyspnea.  He does not wear oxygen at home.  He has not had any chest pain, palpitations.  He denies orthopnea.  He reports compliance with his torsemide.  Patient was seen at an outpatient clinic and sent to the ER for cellulitis/CHF workup. - Patient does not smoke.  He does drink up to a sixpack of beer per day.  Patient would like to be DNR.  Assessment and Plan: 1)AKI----acute kidney injury --POA  -AKI due to dehydration and poor oral intake compounded by torsemide and Entresto use prior to admission creatinine on admission=2.21 on Aug 22, 2023 - baseline creatinine = 0.86 (2020)     -creatinine has normalized with hydration -Continue IV fluids especially due to lack of oral intake in the setting of agitation/delirium -renally adjust medications, avoid nephrotoxic agents / dehydration  / hypotension  2)Hypotonic hyponatremia/hypochloremia -Partly due to beer potomania, compounded poor intake and torsemide use Serum osmolality 273, slightly low -Electrolytes improving with hydration and replacements Na--120 >>123 >>127>>134>>139 -Chloride is  normalized  3)HFrEF- -repeat echo from 08/13/2023 with EF of 50 to 55% without wall motion abnormalities, no diastolic dysfunction -BNP on admission is 48 -Chest x-ray on 08-22-23 and repeat chest x-ray on 08/14/2023 without CHF concerns or other acute findings -Continue to hold Coreg, torsemide and Entresto for now due to soft BP and AKI  -May use IV metoprolol as needed tachycardia -Continue gentle hydration as above #1  4)Delirium--suspect some component of DTs -Wife (retired Charity fundraiser) reports similar presentation back at [facility] in 2020 -Apparently prior to admission patient had cut way back on his alcohol usage -Hyponatremia and AKI could have been contributing to his delirium/mental status changes as well -Chest x-ray and Blood cultures Not suggestive of infection -Concerns  for possible cellulitis of the legs-currently on Abx as below in #5 -Patient remained quite agitated despite benzos C/n  iv Precedex for delirium/behavioral issues and agitation  -Wrist restraints -Continue multivitamin, thiamine and folic acid -Ammonia is not elevated -Continue lorazepam per CIWA protocol  5)Lower extremity cellulitis--- please see photos in epic -Actually no significant pitting edema at this time--patient is actually dry with scaly skin -Lower extremity Dopplers without DVT -Echo and chest x-ray and BNP Not suggestive of acute CHF -Continue Rocephin -Stopped Flagyl WBC 11.0 >>10.8 >>7.7>.8.9 -May de-escalate to Keflex when awake enough to take oral intake  6) hypokalemia/hypophosphatemia--replace and recheck electrolytes   Coronary artery disease - Continue aspirin, beta-blocker and statin--when awake enough to take oral medications - Holding Entresto due to  soft pressures at presentation   COPD exacerbation,--hold off on steroids given mental status concerns -Continue bronchodilators ABG reviewed, within normal range  Body mass index is 34.37 kg/m.   Interventions:  CRITICAL CARE Performed by: Shon Hale  Total critical care time: 41 minutes  Critical care time was exclusive of separately billable procedures and treating other patients.  Critical care was necessary to treat or prevent imminent or life-threatening deterioration. -Delirium/Agitation and restlessness persisted despite IV lorazepam per protocol,  -- Continue IV Precedex and IV lorazepam for agitation/delirium  Critical care was time spent personally by me on the following activities: development of treatment plan with patient and/or surrogate as well as nursing, discussions with consultants, evaluation of patient's response to treatment, examination of patient, obtaining history from patient or surrogate, ordering and performing treatments and interventions, ordering and review of laboratory studies, ordering and review of radiographic studies, pulse oximetry and re-evaluation of patient's condition.   Diet: Heart healthy diet DVT Prophylaxis: Subcutaneous Lovenox   Advance goals of care discussion: DNR/DNI--- discussed with wife who confirms DNR/DNI status  Family Communication: -Discussed with wife who is a retired Char[employer] fundraiser ,questions answered -08/16/23 Attempted to reach pt's wife Candace at [phone] update her however no answer on her phone and her voicemail has not been set up yet  Disposition:  -Having significant delirium/agitation at this time requiring IV Precedex and IV lorazepam  Subjective:  -Episodes of agitation alternating with lethargy -Continues to require IV Precedex and IV lorazepam -Patient is not alert enough to have reliable oral intake at this time Attempted to reach pt's wife Candace at [phone] update her however no answer on her phone and her voicemail has not been set up yet  Physical Exam: Gen:-Was agitated earlier, now somewhat lethargic after as needed Ativan was given HEENT:- [place].AT, No sclera icterus Neck-Supple  Neck,No JVD,.  Lungs-fair air movement, no rales, occasionally audible upper airway sounds CV- S1, S2 normal, irregular Abd-  +ve B.Sounds, Abd Soft, No tenderness,    Extremity/Skin:-No significant pitting edema,   good pedal pulses, rash and lower extremity skin changes, very scaly/dry skin and open wounds please see photo in epic Neuro-Psych--episodes of agitation/restlessness alternating with lethargy after medication with benzos -Tremors persist -  Media Information  Document Information  Photos    08/14/2023 15:08  Attached To:  Hospital Encounter on 08/22/2023  Source Information  Shon Hale, MD  [facility]  Document History      Media Information  Document Information  Photos    08/14/2023 15:08  Attached To:  Hospital Encounter on August 22, 2023  Source Information  Shon Hale, MD  [facility]  Document History     Vitals:   08/16/23 1212 08/16/23 1328 08/16/23 1400 08/16/23 1500  BP:  (!) 154/90 (!) 146/99 (!) 159/98  Pulse:  (!) 128 (!) 130 (!) 120  Resp:  (!) 27 (!) 32 (!) 24  Temp: 97.7 F (36.5 C)     TempSrc: Axillary     SpO2:  100% 98% 99%  Weight:      Height:        Intake/Output Summary (Last 24 hours) at 08/16/2023 1528 Last data filed at 08/16/2023 1300 Gross per 24 hour  Intake 2529.15 ml  Output 1300 ml  Net 1229.15 ml   Filed Weights   08/13/23 0455 08/14/23 0600 08/16/23 0500  Weight: 96.5 kg 96.5 kg 96.6 kg   CBC: Recent Labs  Lab 08/22/23 1531 08/13/23 0517 08/14/23 0324 08/15/23 0511 08/16/23  0449  WBC 11.0* 7.5 10.8* 7.7 8.9  NEUTROABS 8.9* 5.6  --   --   --   HGB 12.4* 11.7* 12.1* 11.9* 11.9*  HCT 33.4* 32.1* 33.5* 35.3* 34.6*  MCV 90.8 92.8 93.1 96.2 96.9  PLT 314 253 314 278 305   Basic Metabolic Panel: Recent Labs  Lab 09/08/2023 1527 08/22/2023 1531 09/05/2023 2119 08/13/23 0517 08/13/23 2249 08/14/23 0324 08/15/23 0621 08/16/23 0449  NA  --  120*   < > 123* 127* 127* 134* 139  K  --   2.5*   < > 2.7* 3.3* 3.2* 3.3* 3.4*  CL  --  77*   < > 87* 92* 92* 99 105  CO2  --  28   < > 29 24 22 25 23   GLUCOSE  --  134*   < > 123* 142* 143* 104* 100*  BUN  --  32*   < > 24* 13 12 7* 5*  CREATININE  --  2.21*   < > 1.25* 0.93 0.86 0.82 0.78  CALCIUM  --  8.9   < > 8.1* 8.6* 8.8* 8.5* 8.4*  MG  --  1.9  --  1.9  --  1.8  --   --   PHOS 2.8  --   --   --   --  1.7* 2.4*  --    < > = values in this interval not displayed.    Studies: No results found.  Scheduled Meds:  aspirin EC  81 mg Oral Daily   Chlorhexidine Gluconate Cloth  6 each Topical Daily   enoxaparin (LOVENOX) injection  40 mg Subcutaneous QPM   fluticasone furoate-vilanterol  1 puff Inhalation Daily   folic acid  1 mg Oral Daily   guaiFENesin  600 mg Oral BID   ipratropium  0.5 mg Nebulization Q6H   LORazepam  2 mg Intravenous Once   multivitamin with minerals  1 tablet Oral Daily   rosuvastatin  40 mg Oral QHS   thiamine  100 mg Oral Daily   Or   thiamine  100 mg Intravenous Daily   Continuous Infusions:  sodium chloride     cefTRIAXone (ROCEPHIN)  IV 2 g (08/16/23 1458)   dexmedetomidine (PRECEDEX) IV infusion Stopped (08/16/23 0950)   potassium chloride 10 mEq (08/16/23 1451)   PRN Meds: acetaminophen **OR** acetaminophen, glycopyrrolate, LORazepam **OR** LORazepam, metoprolol tartrate, ondansetron **OR** ondansetron (ZOFRAN) IV, mouth rinse, oxyCODONE  Author: Shon Hale, MD  [facility] 08/16/2023 3:28 PM  To reach On-call, see care teams to locate the attending and reach out to them via [email or web address]. If 7PM-7AM, please contact night-coverage If you still have difficulty reaching the attending provider, please page the [facility] (Director on Call) for [facility] on amion for assistance.

## 2023-08-17 ENCOUNTER — Inpatient Hospital Stay (HOSPITAL_COMMUNITY): Payer: Medicare Other

## 2023-08-17 DIAGNOSIS — N179 Acute kidney failure, unspecified: Secondary | ICD-10-CM | POA: Diagnosis not present

## 2023-08-17 LAB — CBC
HCT: 34.7 % — ABNORMAL LOW (ref 39.0–52.0)
Hemoglobin: 11.3 g/dL — ABNORMAL LOW (ref 13.0–17.0)
MCH: 32.7 pg (ref 26.0–34.0)
MCHC: 32.6 g/dL (ref 30.0–36.0)
MCV: 100.3 fL — ABNORMAL HIGH (ref 80.0–100.0)
Platelets: 287 10*3/uL (ref 150–400)
RBC: 3.46 MIL/uL — ABNORMAL LOW (ref 4.22–5.81)
RDW: 13.7 % (ref 11.5–15.5)
WBC: 8.1 10*3/uL (ref 4.0–10.5)
nRBC: 0 % (ref 0.0–0.2)

## 2023-08-17 LAB — CULTURE, BLOOD (ROUTINE X 2)
Culture: NO GROWTH
Culture: NO GROWTH
Special Requests: ADEQUATE

## 2023-08-17 LAB — GLUCOSE, CAPILLARY
Glucose-Capillary: 106 mg/dL — ABNORMAL HIGH (ref 70–99)
Glucose-Capillary: 104 mg/dL — ABNORMAL HIGH (ref 70–99)
Glucose-Capillary: 114 mg/dL — ABNORMAL HIGH (ref 70–99)
Glucose-Capillary: 98 mg/dL (ref 70–99)

## 2023-08-17 LAB — MAGNESIUM: Magnesium: 1.7 mg/dL (ref 1.7–2.4)

## 2023-08-17 MED ORDER — MAGNESIUM SULFATE 2 GM/50ML IV SOLN
2.0000 g | Freq: Once | INTRAVENOUS | Status: AC
  Administered 2023-08-17: 2 g via INTRAVENOUS
  Filled 2023-08-17: qty 50

## 2023-08-17 NOTE — Plan of Care (Signed)
  Problem: Education: Goal: Knowledge of General Education information will improve Description: Including pain rating scale, medication(s)/side effects and non-pharmacologic comfort measures Outcome: Not Progressing   Problem: Health Behavior/Discharge Planning: Goal: Ability to manage health-related needs will improve Outcome: Not Progressing   Problem: Clinical Measurements: Goal: Ability to maintain clinical measurements within normal limits will improve Outcome: Not Progressing Goal: Diagnostic test results will improve Outcome: Not Progressing Goal: Respiratory complications will improve Outcome: Not Progressing Goal: Cardiovascular complication will be avoided Outcome: Progressing

## 2023-08-17 NOTE — Progress Notes (Signed)
[facility] Progress Note  Patient: David Mack    WUJ:[id number]  DOA: 09/05/2023     Date of Service: the patient was seen and examined on 08/17/2023  Chief Complaint  Patient presents with   Fluid Overload (CHF)   Brief hospital course: David Mack is a 77 y.o. male with medical history significant of COPD, alcohol use, hypertension, CHF, and more presents the ED with a chief complaint of fluid overload.  Patient reports that he has been retaining fluid in his right leg and his right leg has been weeping.  He reports has been retaining fluid in that leg for a long time but it got worse over the last 2 weeks.  He denies any pain or numbness in that leg.  There is associated erythema in that leg.  Patient is not sure when that started.  When the leg drains, it is clear fluid, no purulence.  He has dyspnea but it is no worse than his normal dyspnea.  He does not wear oxygen at home.  He has not had any chest pain, palpitations.  He denies orthopnea.  He reports compliance with his torsemide.  Patient was seen at an outpatient clinic and sent to the ER for cellulitis/CHF workup. - Patient does not smoke.  He does drink up to a sixpack of beer per day.  Patient would like to be DNR.  Assessment and Plan: 1)AKI----acute kidney injury --POA  -AKI due to dehydration and poor oral intake compounded by torsemide and Entresto use prior to admission creatinine on admission=2.21 on 08/13/2023 - baseline creatinine = 0.86 (2020)     -creatinine has normalized with hydration -Continue IV fluids especially due to lack of oral intake in the setting of agitation/delirium -renally adjust medications, avoid nephrotoxic agents / dehydration  / hypotension  2)Hypotonic hyponatremia/hypochloremia -Partly due to beer potomania, compounded poor intake and torsemide use Serum osmolality 273, slightly low -Electrolytes improving with hydration and replacements Na--120 >>123 >>127>>134>>139 -Chloride is  normalized  3)HFrEF- -repeat echo from 08/13/2023 with EF of 50 to 55% without wall motion abnormalities, no diastolic dysfunction -BNP on admission is 48 -Chest x-ray on 09/01/2023 and repeat chest x-ray on 08/14/2023 without CHF concerns or other acute findings -Continue to hold Coreg, torsemide and Entresto for now due to soft BP and AKI  -May use IV metoprolol as needed tachycardia -Continue gentle hydration as above #1 -Chest x-ray from 08/17/2023 without acute findings  4)Delirium--suspect some component of DTs -Wife (retired Charity fundraiser) reports similar presentation back at [facility] in 2020 -Apparently prior to admission patient had cut way back on his alcohol usage -Hyponatremia and AKI could have been contributing to his delirium/mental status changes as well -Chest x-ray and Blood cultures Not suggestive of infection -Concerns  for possible cellulitis of the legs-currently on Abx as below in #5 -Patient remained quite agitated despite benzos C/n  iv Precedex for delirium/behavioral issues and agitation  -Wrist restraints -Continue multivitamin, thiamine and folic acid -Ammonia is not elevated -Continue lorazepam per CIWA protocol  5)Lower extremity cellulitis--- please see photos in epic -Actually no significant pitting edema at this time--patient is actually dry with scaly skin -Lower extremity Dopplers without DVT -Echo and chest x-ray and BNP Not suggestive of acute CHF -Continue Rocephin -Stopped Flagyl WBC 11.0 >>10.8 >>7.7>.8.9 -May de-escalate to Keflex when awake enough to take oral intake  6) hypokalemia/hypophosphatemia--replace and recheck electrolytes   Coronary artery disease - Continue aspirin, beta-blocker and statin--when awake enough to take  oral medications - Holding Entresto due to soft pressures at presentation   COPD exacerbation,--hold off on steroids given mental status concerns -Continue bronchodilators ABG reviewed, within normal range  Body  mass index is 33.98 kg/m.  Interventions:  CRITICAL CARE Performed by: Shon Hale  Total critical care time: 46 minutes  Critical care time was exclusive of separately billable procedures and treating other patients.  Critical care was necessary to treat or prevent imminent or life-threatening deterioration. -Delirium/Agitation and restlessness persisted despite IV lorazepam per protocol,  -- Continue IV Precedex and IV lorazepam for agitation/delirium  Critical care was time spent personally by me on the following activities: development of treatment plan with patient and/or surrogate as well as nursing, discussions with consultants, evaluation of patient's response to treatment, examination of patient, obtaining history from patient or surrogate, ordering and performing treatments and interventions, ordering and review of laboratory studies, ordering and review of radiographic studies, pulse oximetry and re-evaluation of patient's condition.   Diet: Heart healthy diet DVT Prophylaxis: Subcutaneous Lovenox   Advance goals of care discussion: DNR/DNI--- discussed with wife who confirms DNR/DNI status  Family Communication: -Discussed with wife who is a retired Charity fundraiser ,questions answered -Updated patient's wife face-to-face at bedside on 08/17/2023 - wife Candace at [phone]-   Disposition:  -Having significant delirium/agitation at this time requiring IV Precedex and IV lorazepam  Subjective:   -Updated patient's wife face-to-face at bedside on 08/17/2023 -Questions answered -Continues to have episodes of agitation and restlessness requiring IV Precedex and IV lorazepam -No fevers -Tachycardia tachypnea and elevated BP from time to time  Physical Exam: Gen:-Was agitated earlier, now somewhat lethargic after as needed Ativan was given HEENT:- [place].AT, No sclera icterus Neck-Supple Neck,No JVD,.  Lungs-fair air movement, no rales, occasionally audible upper airway sounds CV-  S1, S2 normal, irregular Abd-  +ve B.Sounds, Abd Soft, No tenderness,    Extremity/Skin:-No significant pitting edema,   good pedal pulses, rash and lower extremity skin changes, very scaly/dry skin and open wounds please see photo in epic Neuro-Psych--episodes of agitation/restlessness alternating with lethargy after medication with benzos -Tremors persist -  Media Information  Document Information  Photos    08/14/2023 15:08  Attached To:  Hospital Encounter on 09/07/2023  Source Information  Shon Hale, MD  [facility]  Document History      Media Information  Document Information  Photos    08/14/2023 15:08  Attached To:  Hospital Encounter on 08/30/2023  Source Information  Shon Hale, MD  [facility]  Document History     Vitals:   08/17/23 1547 08/17/23 1600 08/17/23 1724 08/17/23 1800  BP:  (!) 158/117 (!) 150/109 (!) 164/104  Pulse:  (!) 123 (!) 127 (!) 125  Resp:  (!) 28 (!) 28 (!) 25  Temp: 99 F (37.2 C)     TempSrc: Axillary     SpO2:  95% 96% 96%  Weight:      Height:        Intake/Output Summary (Last 24 hours) at 08/17/2023 1821 Last data filed at 08/17/2023 1800 Gross per 24 hour  Intake 2084.72 ml  Output 850 ml  Net 1234.72 ml   Filed Weights   08/14/23 0600 08/16/23 0500 08/17/23 0429  Weight: 96.5 kg 96.6 kg 95.5 kg   CBC: Recent Labs  Lab 09/02/2023 1531 08/13/23 0517 08/14/23 0324 08/15/23 0511 08/16/23 0449 08/17/23 0353  WBC 11.0* 7.5 10.8* 7.7 8.9 8.1  NEUTROABS 8.9* 5.6  --   --   --   --  HGB 12.4* 11.7* 12.1* 11.9* 11.9* 11.3*  HCT 33.4* 32.1* 33.5* 35.3* 34.6* 34.7*  MCV 90.8 92.8 93.1 96.2 96.9 100.3*  PLT 314 253 314 278 305 287   Basic Metabolic Panel: Recent Labs  Lab 2023-09-04 1527 09-04-23 1531 September 04, 2023 2119 08/13/23 0517 08/13/23 2249 08/14/23 0324 08/15/23 0621 08/16/23 0449 08/17/23 0353  NA  --  120*   < > 123* 127* 127* 134* 139  --   K  --  2.5*   < > 2.7* 3.3* 3.2* 3.3*  3.4*  --   CL  --  77*   < > 87* 92* 92* 99 105  --   CO2  --  28   < > 29 24 22 25 23   --   GLUCOSE  --  134*   < > 123* 142* 143* 104* 100*  --   BUN  --  32*   < > 24* 13 12 7* 5*  --   CREATININE  --  2.21*   < > 1.25* 0.93 0.86 0.82 0.78  --   CALCIUM  --  8.9   < > 8.1* 8.6* 8.8* 8.5* 8.4*  --   MG  --  1.9  --  1.9  --  1.8  --   --  1.7  PHOS 2.8  --   --   --   --  1.7* 2.4*  --   --    < > = values in this interval not displayed.    Studies: DG CHEST PORT 1 VIEW  Result Date: 08/17/2023 CLINICAL DATA:  77 year old male with history of dyspnea. EXAM: PORTABLE CHEST 1 VIEW COMPARISON:  Chest x-ray 08/14/2023. FINDINGS: Lung volumes are low. No consolidative airspace disease. Mild diffuse interstitial prominence and peribronchial cuffing appears chronic and similar to prior studies. No pleural effusions. No pneumothorax. No pulmonary nodule or mass noted. Pulmonary vasculature and the cardiomediastinal silhouette are within normal limits. Atherosclerotic calcifications are noted in the thoracic aorta. Status post median sternotomy for CABG. IMPRESSION: 1. Low lung volumes without radiographic evidence of acute cardiopulmonary disease. 2. Aortic atherosclerosis. Electronically Signed   By: Trudie Reed M.D.   On: 08/17/2023 14:01    Scheduled Meds:  aspirin EC  81 mg Oral Daily   Chlorhexidine Gluconate Cloth  6 each Topical Daily   enoxaparin (LOVENOX) injection  40 mg Subcutaneous QPM   fluticasone furoate-vilanterol  1 puff Inhalation Daily   folic acid  1 mg Oral Daily   guaiFENesin  600 mg Oral BID   ipratropium  0.5 mg Nebulization Q6H   LORazepam  2 mg Intravenous Once   multivitamin with minerals  1 tablet Oral Daily   rosuvastatin  40 mg Oral QHS   thiamine  100 mg Oral Daily   Or   thiamine  100 mg Intravenous Daily   Continuous Infusions:  sodium chloride 75 mL/hr at 08/17/23 1800   cefTRIAXone (ROCEPHIN)  IV Stopped (08/17/23 1520)   dexmedetomidine (PRECEDEX)  IV infusion 0.5 mcg/kg/hr (08/17/23 1800)   PRN Meds: acetaminophen **OR** acetaminophen, glycopyrrolate, LORazepam **OR** LORazepam, metoprolol tartrate, ondansetron **OR** ondansetron (ZOFRAN) IV, mouth rinse, oxyCODONE  Author: Shon Hale, MD  [facility] 08/17/2023 6:21 PM  To reach On-call, see care teams to locate the attending and reach out to them via [email or web address]. If 7PM-7AM, please contact night-coverage If you still have difficulty reaching the attending provider, please page the [facility] (Director on Call) for [facility] on amion for assistance.

## 2023-08-18 ENCOUNTER — Inpatient Hospital Stay (HOSPITAL_COMMUNITY): Payer: Medicare Other

## 2023-08-18 DIAGNOSIS — I25810 Atherosclerosis of coronary artery bypass graft(s) without angina pectoris: Secondary | ICD-10-CM

## 2023-08-18 DIAGNOSIS — G9340 Encephalopathy, unspecified: Secondary | ICD-10-CM | POA: Diagnosis not present

## 2023-08-18 DIAGNOSIS — I471 Supraventricular tachycardia, unspecified: Secondary | ICD-10-CM

## 2023-08-18 DIAGNOSIS — L03115 Cellulitis of right lower limb: Secondary | ICD-10-CM | POA: Diagnosis not present

## 2023-08-18 DIAGNOSIS — N179 Acute kidney failure, unspecified: Secondary | ICD-10-CM | POA: Diagnosis not present

## 2023-08-18 LAB — RENAL FUNCTION PANEL
Albumin: 3.2 g/dL — ABNORMAL LOW (ref 3.5–5.0)
Anion gap: 15 (ref 5–15)
BUN: 15 mg/dL (ref 8–23)
CO2: 20 mmol/L — ABNORMAL LOW (ref 22–32)
Calcium: 8.9 mg/dL (ref 8.9–10.3)
Chloride: 111 mmol/L (ref 98–111)
Creatinine, Ser: 1.13 mg/dL (ref 0.61–1.24)
GFR, Estimated: >60 mL/min (ref >60)
Glucose, Bld: 122 mg/dL — ABNORMAL HIGH (ref 70–99)
Phosphorus: 2.4 mg/dL — ABNORMAL LOW (ref 2.5–4.6)
Potassium: 3.9 mmol/L (ref 3.5–5.1)
Sodium: 146 mmol/L — ABNORMAL HIGH (ref 135–145)

## 2023-08-18 LAB — BLOOD GAS, ARTERIAL
Acid-base deficit: 2.5 mmol/L — ABNORMAL HIGH (ref 0.0–2.0)
Bicarbonate: 24.2 mmol/L (ref 20.0–28.0)
Drawn by: 419
O2 Saturation: 99.1 %
Patient temperature: 37.1
pCO2 arterial: 48 mm[Hg] (ref 32–48)
pH, Arterial: 7.31 — ABNORMAL LOW (ref 7.35–7.45)
pO2, Arterial: 90 mm[Hg] (ref 83–108)

## 2023-08-18 LAB — CBC
HCT: 37.6 % — ABNORMAL LOW (ref 39.0–52.0)
Hemoglobin: 12.4 g/dL — ABNORMAL LOW (ref 13.0–17.0)
MCH: 33.1 pg (ref 26.0–34.0)
MCHC: 33 g/dL (ref 30.0–36.0)
MCV: 100.3 fL — ABNORMAL HIGH (ref 80.0–100.0)
Platelets: 284 10*3/uL (ref 150–400)
RBC: 3.75 MIL/uL — ABNORMAL LOW (ref 4.22–5.81)
RDW: 14.3 % (ref 11.5–15.5)
WBC: 8.2 10*3/uL (ref 4.0–10.5)
nRBC: 0 % (ref 0.0–0.2)

## 2023-08-18 LAB — GLUCOSE, CAPILLARY
Glucose-Capillary: 118 mg/dL — ABNORMAL HIGH (ref 70–99)
Glucose-Capillary: 119 mg/dL — ABNORMAL HIGH (ref 70–99)
Glucose-Capillary: 104 mg/dL — ABNORMAL HIGH (ref 70–99)
Glucose-Capillary: 98 mg/dL (ref 70–99)

## 2023-08-18 LAB — TROPONIN I (HIGH SENSITIVITY)
Troponin I (High Sensitivity): 66 ng/L — ABNORMAL HIGH (ref <18)
Troponin I (High Sensitivity): 58 ng/L — ABNORMAL HIGH (ref <18)

## 2023-08-18 LAB — ALDOSTERONE + RENIN ACTIVITY W/ RATIO
ALDO / PRA Ratio: 0.3 (ref 0.0–30.0)
Aldosterone: 1 ng/dL (ref 0.0–30.0)
PRA LC/MS/MS: 3.925 ng/mL/h (ref 0.167–5.380)

## 2023-08-18 LAB — AMMONIA: Ammonia: 28 umol/L (ref 9–35)

## 2023-08-18 MED ORDER — METOPROLOL TARTRATE 5 MG/5ML IV SOLN
5.0000 mg | Freq: Once | INTRAVENOUS | Status: AC
  Administered 2023-08-18: 5 mg via INTRAVENOUS
  Filled 2023-08-18: qty 5

## 2023-08-18 MED ORDER — FUROSEMIDE 10 MG/ML IJ SOLN
20.0000 mg | Freq: Once | INTRAMUSCULAR | Status: AC
  Administered 2023-08-18: 20 mg via INTRAVENOUS
  Filled 2023-08-18: qty 2

## 2023-08-18 MED ORDER — LORAZEPAM 2 MG/ML IJ SOLN
1.0000 mg | INTRAMUSCULAR | Status: AC | PRN
  Administered 2023-08-18 – 2023-08-19 (×4): 2 mg via INTRAVENOUS
  Filled 2023-08-18 (×4): qty 1

## 2023-08-18 MED ORDER — NOREPINEPHRINE 4 MG/250ML-% IV SOLN
0.0000 ug/min | INTRAVENOUS | Status: DC
  Administered 2023-08-18: 2 ug/min via INTRAVENOUS
  Filled 2023-08-18 (×2): qty 250

## 2023-08-18 MED ORDER — LORAZEPAM 1 MG PO TABS: 1.0000 mg | ORAL_TABLET | ORAL | Status: AC | PRN

## 2023-08-18 MED ORDER — ADULT MULTIVITAMIN W/MINERALS CH: 1.0000 | ORAL_TABLET | Freq: Every day | ORAL | Status: DC

## 2023-08-18 MED ORDER — FOLIC ACID 1 MG PO TABS: 1.0000 mg | ORAL_TABLET | Freq: Every day | ORAL | Status: DC

## 2023-08-18 MED ORDER — THIAMINE MONONITRATE 100 MG PO TABS: 100.0000 mg | ORAL_TABLET | Freq: Every day | ORAL | Status: DC

## 2023-08-18 MED ORDER — SODIUM CHLORIDE 0.9 % IV SOLN
250.0000 mL | INTRAVENOUS | Status: DC
  Administered 2023-08-18: 250 mL via INTRAVENOUS

## 2023-08-18 MED ORDER — IPRATROPIUM-ALBUTEROL 0.5-2.5 (3) MG/3ML IN SOLN
3.0000 mL | Freq: Once | RESPIRATORY_TRACT | Status: AC
  Administered 2023-08-18: 3 mL via RESPIRATORY_TRACT
  Filled 2023-08-18: qty 3

## 2023-08-18 MED ORDER — THIAMINE HCL 100 MG/ML IJ SOLN: 100.0000 mg | Freq: Every day | INTRAMUSCULAR | Status: DC

## 2023-08-18 NOTE — Consult Note (Addendum)
[facility]   thiamine  100 mg Oral Daily   Or   thiamine  100 mg Intravenous Daily   Continuous Infusions:  sodium chloride 75 mL/hr at 08/18/23 1225   sodium chloride Stopped (08/18/23 1210)   cefTRIAXone (ROCEPHIN)  IV Stopped (08/17/23 1520)   dexmedetomidine (PRECEDEX) IV infusion Stopped (08/18/23 1020)   norepinephrine (LEVOPHED) Adult infusion 2 mcg/min (08/18/23 1225)   PRN Meds: acetaminophen **OR** acetaminophen, glycopyrrolate, LORazepam **OR** LORazepam, metoprolol tartrate, ondansetron **OR** ondansetron (ZOFRAN) IV, mouth rinse,  oxyCODONE  Allergies:    Allergies  Allergen Reactions   Albuterol Nausea And Vomiting and Other (See Comments)    Dizziness - unable to stand   Pulmicort [Budesonide] Other (See Comments)    Makes very jittery and feels worse rather than better    Social History:   Social History   Socioeconomic History   Marital status: Married    Spouse name: Not on file   Number of children: Not on file   Years of education: Not on file   Highest education level: Not on file  Occupational History   Not on file  Tobacco Use   Smoking status: Former    Current packs/day: 0.00    Average packs/day: 0.5 packs/day for 30.0 years (15.0 ttl pk-yrs)    Types: Cigarettes    Start date: 11/04/1988    Quit date: 11/04/2018    Years since quitting: 4.7   Smokeless tobacco: Never   Tobacco comments:    HAS NOT SMOKED SINCE SURGERY  Vaping Use   Vaping status: Never Used  Substance and Sexual Activity   Alcohol use: Not Currently    Alcohol/week: 48.0 standard drinks of alcohol    Types: 48 Cans of beer per week    Comment: have not drank whisky is 2 months - only beer   Drug use: Never   Sexual activity: Not Currently  Other Topics Concern   Not on file  Social History Narrative   Not on file   Social Determinants of Health   Financial Resource Strain: Not on file  Food Insecurity: No Food Insecurity (08/14/2023)   Hunger Vital Sign    Worried About Running Out of Food in the Last Year: Never true    Ran Out of Food in the Last Year: Never true  Transportation Needs: No Transportation Needs (08/23/2023)   PRAPARE - Administrator, Civil Service (Medical): No    Lack of Transportation (Non-Medical): No  Physical Activity: Not on file  Stress: Not on file  Social Connections: Not on file  Intimate Partner Violence: Not At Risk (09/11/2023)   Humiliation, Afraid, Rape, and Kick questionnaire    Fear of Current or Ex-Partner: No    Emotionally Abused: No    Physically  Abused: No    Sexually Abused: No    Family History:    Family History  Problem Relation Age of Onset   Alzheimer's disease Mother    Heart disease Mother        diagnosed in older age     ROS:  Please see the history of present illness.  ROS  All other ROS reviewed and negative.     Physical Exam/Data:   Vitals:   08/18/23 1000 08/18/23 1126 08/18/23 1135 08/18/23 1142  BP: 96/71 (!) 71/50 (!) 72/60   Pulse: (!) 127     Resp: (!) 29 18    Temp:    99.8 F (37.7 C)  TempSrc:  CARDIOLOGY CONSULT NOTE    Patient ID: David Mack; [id number]; 1946/08/07   Admit date: 08/13/2023 Date of Consult: 08/18/2023  Primary Care Provider: Toma Deiters, MD Primary Cardiologist:  Primary Electrophysiologist:     Patient Profile:   David Mack is a 77 y.o. male with a hx of CAD  who is being seen today for the evaluation of tachycardia at the request of Dr Mariea Clonts.  History of Present Illness:   David Mack is a 77 y/o M known to have CAD s/p CABG in 2020 with normal LVEF, HTN, COPD was sent to the ER by his PCP for evaluation of right lower EXTR swelling.  History is obtained from the patient's wife.  Patient has been somnolent, not easily arousable.  He has baseline chronic SOB, no worsening.  No angina.  He also has chronic right lower EXTR swelling which was weeping and had a foul-smelling odor according to the wife.  Patient did not want to get it evaluated.  He also drinks beers daily.  BNP on admission was normal.  He is currently admitted to hospitalist team for the management of delirium tremens on Precedex drip, off since this morning.  He was also hypotensive this morning, 70 mmHg SBP due to which norepinephrine drip was started.  Telemetry reviewed, has stepup pattern consistent with arrhythmia.  He was getting IV metoprolol pushes in the last couple of days whenever his HR was elevated.  He is 9 L positive during this admission.  Past Medical History:  Diagnosis Date   Arthritis    "knees" (11/10/2018)   COPD (chronic obstructive pulmonary disease) (HCC)    Hypertension     Past Surgical History:  Procedure Laterality Date   CATARACT EXTRACTION W/PHACO Left 07/06/2015   Procedure: CATARACT EXTRACTION PHACO AND INTRAOCULAR LENS PLACEMENT (IOC);  Surgeon: Gemma Payor, MD;  Location: [facility];  Service: Ophthalmology;  Laterality: Left;  CDE 8.33   CATARACT EXTRACTION W/PHACO Right 08/21/2015   Procedure: CATARACT EXTRACTION PHACO AND INTRAOCULAR LENS  PLACEMENT (IOC);  Surgeon: Gemma Payor, MD;  Location: [facility];  Service: Ophthalmology;  Laterality: Right;  CDE:9.89   CORONARY ARTERY BYPASS GRAFT N/A 11/13/2018   Procedure: CORONARY ARTERY BYPASS GRAFTING (CABG), ON PUMP, TIMES FOUR, USING LEFT INTERNAL MAMMARY ARTERY, AND BILATERAL GREATER SAPHENOUS VEIN HARVESTED ENDOSCOPICALLY;  Surgeon: Kerin Perna, MD;  Location: [facility] OR;  Service: Open Heart Surgery;  Laterality: N/A;   CORONARY ARTERY BYPASS GRAFT  11/13/2018   4 vessell   KNEE ARTHROSCOPY Right 2006   KNEE ARTHROSCOPY Left 2000   RIGHT HEART CATH N/A 11/12/2018   Procedure: RIGHT HEART CATH;  Surgeon: Laurey Morale, MD;  Location: [facility] INVASIVE CV LAB;  Service: Cardiovascular;  Laterality: N/A;   TEE WITHOUT CARDIOVERSION N/A 11/13/2018   Procedure: TRANSESOPHAGEAL ECHOCARDIOGRAM (TEE);  Surgeon: Donata Clay, Theron Arista, MD;  Location: [facility] OR;  Service: Open Heart Surgery;  Laterality: N/A;    Inpatient Medications: Scheduled Meds:  aspirin EC  81 mg Oral Daily   Chlorhexidine Gluconate Cloth  6 each Topical Daily   enoxaparin (LOVENOX) injection  40 mg Subcutaneous QPM   fluticasone furoate-vilanterol  1 puff Inhalation Daily   folic acid  1 mg Oral Daily   guaiFENesin  600 mg Oral BID   ipratropium  0.5 mg Nebulization Q6H   LORazepam  2 mg Intravenous Once   multivitamin with minerals  1 tablet Oral Daily   rosuvastatin  40 mg Oral  QHS   thiamine  100 mg Oral Daily   Or   thiamine  100 mg Intravenous Daily   Continuous Infusions:  sodium chloride 75 mL/hr at 08/18/23 1225   sodium chloride Stopped (08/18/23 1210)   cefTRIAXone (ROCEPHIN)  IV Stopped (08/17/23 1520)   dexmedetomidine (PRECEDEX) IV infusion Stopped (08/18/23 1020)   norepinephrine (LEVOPHED) Adult infusion 2 mcg/min (08/18/23 1225)   PRN Meds: acetaminophen **OR** acetaminophen, glycopyrrolate, LORazepam **OR** LORazepam, metoprolol tartrate, ondansetron **OR** ondansetron (ZOFRAN) IV, mouth rinse,  oxyCODONE  Allergies:    Allergies  Allergen Reactions   Albuterol Nausea And Vomiting and Other (See Comments)    Dizziness - unable to stand   Pulmicort [Budesonide] Other (See Comments)    Makes very jittery and feels worse rather than better    Social History:   Social History   Socioeconomic History   Marital status: Married    Spouse name: Not on file   Number of children: Not on file   Years of education: Not on file   Highest education level: Not on file  Occupational History   Not on file  Tobacco Use   Smoking status: Former    Current packs/day: 0.00    Average packs/day: 0.5 packs/day for 30.0 years (15.0 ttl pk-yrs)    Types: Cigarettes    Start date: 11/04/1988    Quit date: 11/04/2018    Years since quitting: 4.7   Smokeless tobacco: Never   Tobacco comments:    HAS NOT SMOKED SINCE SURGERY  Vaping Use   Vaping status: Never Used  Substance and Sexual Activity   Alcohol use: Not Currently    Alcohol/week: 48.0 standard drinks of alcohol    Types: 48 Cans of beer per week    Comment: have not drank whisky is 2 months - only beer   Drug use: Never   Sexual activity: Not Currently  Other Topics Concern   Not on file  Social History Narrative   Not on file   Social Determinants of Health   Financial Resource Strain: Not on file  Food Insecurity: No Food Insecurity (08/14/2023)   Hunger Vital Sign    Worried About Running Out of Food in the Last Year: Never true    Ran Out of Food in the Last Year: Never true  Transportation Needs: No Transportation Needs (08/23/2023)   PRAPARE - Administrator, Civil Service (Medical): No    Lack of Transportation (Non-Medical): No  Physical Activity: Not on file  Stress: Not on file  Social Connections: Not on file  Intimate Partner Violence: Not At Risk (09/11/2023)   Humiliation, Afraid, Rape, and Kick questionnaire    Fear of Current or Ex-Partner: No    Emotionally Abused: No    Physically  Abused: No    Sexually Abused: No    Family History:    Family History  Problem Relation Age of Onset   Alzheimer's disease Mother    Heart disease Mother        diagnosed in older age     ROS:  Please see the history of present illness.  ROS  All other ROS reviewed and negative.     Physical Exam/Data:   Vitals:   08/18/23 1000 08/18/23 1126 08/18/23 1135 08/18/23 1142  BP: 96/71 (!) 71/50 (!) 72/60   Pulse: (!) 127     Resp: (!) 29 18    Temp:    99.8 F (37.7 C)  TempSrc:  CARDIOLOGY CONSULT NOTE    Patient ID: David Mack; [id number]; 1946/08/07   Admit date: 08/13/2023 Date of Consult: 08/18/2023  Primary Care Provider: Toma Deiters, MD Primary Cardiologist:  Primary Electrophysiologist:     Patient Profile:   David Mack is a 77 y.o. male with a hx of CAD  who is being seen today for the evaluation of tachycardia at the request of Dr Mariea Clonts.  History of Present Illness:   David Mack is a 77 y/o M known to have CAD s/p CABG in 2020 with normal LVEF, HTN, COPD was sent to the ER by his PCP for evaluation of right lower EXTR swelling.  History is obtained from the patient's wife.  Patient has been somnolent, not easily arousable.  He has baseline chronic SOB, no worsening.  No angina.  He also has chronic right lower EXTR swelling which was weeping and had a foul-smelling odor according to the wife.  Patient did not want to get it evaluated.  He also drinks beers daily.  BNP on admission was normal.  He is currently admitted to hospitalist team for the management of delirium tremens on Precedex drip, off since this morning.  He was also hypotensive this morning, 70 mmHg SBP due to which norepinephrine drip was started.  Telemetry reviewed, has stepup pattern consistent with arrhythmia.  He was getting IV metoprolol pushes in the last couple of days whenever his HR was elevated.  He is 9 L positive during this admission.  Past Medical History:  Diagnosis Date   Arthritis    "knees" (11/10/2018)   COPD (chronic obstructive pulmonary disease) (HCC)    Hypertension     Past Surgical History:  Procedure Laterality Date   CATARACT EXTRACTION W/PHACO Left 07/06/2015   Procedure: CATARACT EXTRACTION PHACO AND INTRAOCULAR LENS PLACEMENT (IOC);  Surgeon: Gemma Payor, MD;  Location: [facility];  Service: Ophthalmology;  Laterality: Left;  CDE 8.33   CATARACT EXTRACTION W/PHACO Right 08/21/2015   Procedure: CATARACT EXTRACTION PHACO AND INTRAOCULAR LENS  PLACEMENT (IOC);  Surgeon: Gemma Payor, MD;  Location: [facility];  Service: Ophthalmology;  Laterality: Right;  CDE:9.89   CORONARY ARTERY BYPASS GRAFT N/A 11/13/2018   Procedure: CORONARY ARTERY BYPASS GRAFTING (CABG), ON PUMP, TIMES FOUR, USING LEFT INTERNAL MAMMARY ARTERY, AND BILATERAL GREATER SAPHENOUS VEIN HARVESTED ENDOSCOPICALLY;  Surgeon: Kerin Perna, MD;  Location: [facility] OR;  Service: Open Heart Surgery;  Laterality: N/A;   CORONARY ARTERY BYPASS GRAFT  11/13/2018   4 vessell   KNEE ARTHROSCOPY Right 2006   KNEE ARTHROSCOPY Left 2000   RIGHT HEART CATH N/A 11/12/2018   Procedure: RIGHT HEART CATH;  Surgeon: Laurey Morale, MD;  Location: [facility] INVASIVE CV LAB;  Service: Cardiovascular;  Laterality: N/A;   TEE WITHOUT CARDIOVERSION N/A 11/13/2018   Procedure: TRANSESOPHAGEAL ECHOCARDIOGRAM (TEE);  Surgeon: Donata Clay, Theron Arista, MD;  Location: [facility] OR;  Service: Open Heart Surgery;  Laterality: N/A;    Inpatient Medications: Scheduled Meds:  aspirin EC  81 mg Oral Daily   Chlorhexidine Gluconate Cloth  6 each Topical Daily   enoxaparin (LOVENOX) injection  40 mg Subcutaneous QPM   fluticasone furoate-vilanterol  1 puff Inhalation Daily   folic acid  1 mg Oral Daily   guaiFENesin  600 mg Oral BID   ipratropium  0.5 mg Nebulization Q6H   LORazepam  2 mg Intravenous Once   multivitamin with minerals  1 tablet Oral Daily   rosuvastatin  40 mg Oral

## 2023-08-18 NOTE — Progress Notes (Signed)
[facility] Progress Note  Patient: David Mack    XBJ:[id number]  DOA: 2023-08-16     Date of Service: the patient was seen and examined on 08/18/2023  Chief Complaint  Patient presents with   Fluid Overload (CHF)   Brief hospital course: David Mack is a 77 y.o. male with medical history significant of COPD, alcohol use, hypertension, CHF, and more presents the ED with a chief complaint of fluid overload.  Patient reports that he has been retaining fluid in his right leg and his right leg has been weeping.  He reports has been retaining fluid in that leg for a long time but it got worse over the last 2 weeks.  He denies any pain or numbness in that leg.  There is associated erythema in that leg.  Patient is not sure when that started.  When the leg drains, it is clear fluid, no purulence.  He has dyspnea but it is no worse than his normal dyspnea.  He does not wear oxygen at home.  He has not had any chest pain, palpitations.  He denies orthopnea.  He reports compliance with his torsemide.  Patient was seen at an outpatient clinic and sent to the ER for cellulitis/CHF workup. - Patient does not smoke.  He does drink up to a sixpack of beer per day.  Patient would like to be DNR.  Assessment and Plan: 1)AKI----acute kidney injury --POA  -AKI due to dehydration and poor oral intake compounded by torsemide and Entresto use prior to admission creatinine on admission=2.21 on 16-Aug-2023 - baseline creatinine = 0.86 (2020)     -creatinine has normalized with hydration 08/18/23 -Hold IV fluids due to concerns about possible volume overload -renally adjust medications, avoid nephrotoxic agents / dehydration  / hypotension  2)Hypotonic hyponatremia/hypochloremia -Partly due to beer potomania, compounded poor intake and torsemide use Serum osmolality 273, slightly low -Electrolytes improving with hydration and replacements Na--120 >>123 >>127>>134>>139>>146 -Chloride is  normalized -Hypernatremia noted--- IV fluid discontinued  3)HFrEF- -repeat echo from 08/13/2023 with EF of 50 to 55% without wall motion abnormalities, no diastolic dysfunction -BNP on admission is 48 -Chest x-ray on 2023-08-16 and repeat chest x-ray on 08/14/2023 without CHF concerns or other acute findings -Continue to hold Coreg, torsemide and Entresto for now due to soft BP and AKI  -May use IV metoprolol as needed tachycardia -Chest x-ray from 08/17/2023 without acute findings 08/18/23 Chest x-ray on 08/18/2023 for possible volume overload/pulmonary venous congestion -Cardiology consult appreciated -Hold further IV fluids -Lasix 20 mg IV x 1  4)Delirium--suspect some component of DTs -Wife (retired Charity fundraiser) reports similar presentation back at [facility] in 2020 -Apparently prior to admission patient had cut way back on his alcohol usage -Hyponatremia and AKI could have been contributing to his delirium/mental status changes as well -Chest x-ray and Blood cultures Not suggestive of infection -Concerns  for possible cellulitis of the legs-currently on Abx as below in #5 -Patient remained quite agitated despite benzos C/n  iv Precedex for delirium/behavioral issues and agitation  -Wrist restraints -Continue multivitamin, thiamine and folic acid -Ammonia is not elevated -Continue lorazepam per CIWA protocol  5)Lower extremity cellulitis--- please see photos in epic -Actually no significant pitting edema at this time--patient is actually dry with scaly skin -Lower extremity Dopplers without DVT -Echo and chest x-ray and BNP Not suggestive of acute CHF -Continue Rocephin -Stopped Flagyl WBC 11.0 >>10.8 >>7.7>.8.9>>8.2 -May de-escalate to Keflex when awake enough to take oral intake  6)  hypokalemia/hypophosphatemia--replace and recheck electrolytes  7) tachycardia--episodes of tachycardia noted -Serial EKGs noted -Cardiology consult appreciated -As needed metoprolol as  ordered  8) hypotension--- in the setting of tachycardia -Patient briefly required IV Levophed drip today -Weaned off Levophed at this time -Cardiology input appreciated   Coronary artery disease - Continue aspirin, beta-blocker and statin--when awake enough to take oral medications - Holding Entresto due to soft pressures at presentation   COPD exacerbation,--hold off on steroids given mental status concerns -Continue bronchodilators ABG reviewed, within normal range  Body mass index is 34.41 kg/m.  Interventions:  CRITICAL CARE Performed by: Shon Hale  Total critical care time:  Critical care time was exclusive of separately billable procedures and treating other patients.  Critical care was necessary to treat or prevent imminent or life-threatening deterioration. -Weaning of IV Precedex drip -Episode of hypotension and required IV Levophed temporarily  Critical care was time spent personally by me on the following activities: development of treatment plan with patient and/or surrogate as well as nursing, discussions with consultants, evaluation of patient's response to treatment, examination of patient, obtaining history from patient or surrogate, ordering and performing treatments and interventions, ordering and review of laboratory studies, ordering and review of radiographic studies, pulse oximetry and re-evaluation of patient's condition.   Diet: Heart healthy diet DVT Prophylaxis: Subcutaneous Lovenox   Advance goals of care discussion: DNR/DNI--- discussed with wife who confirms DNR/DNI status  Family Communication: -Discussed with wife who is a retired Charity fundraiser ,questions answered -Updated patient's wife face-to-face at bedside on 08/18/2023 - [name] at [phone]-   Disposition: TBD---condition is currently guarded--critical -  Subjective:   -Wife at bedside -Cardiologist at bedside -RN at bedside =-Patient is more lethargic -Had episode  of tachycardia with hypotension briefly required IV Levophed drip -Precedex drip has been turned off  Physical Exam: Gen:-More lethargic HEENT:- [place].AT, No sclera icterus Nose---[place] -wean down to 2 L from 10 L earlier Neck-Supple Neck,No JVD,.  Lungs-fair air movement, no rales, occasionally audible upper airway sounds CV- S1, S2 normal, irregular Abd-  +ve B.Sounds, Abd Soft, No tenderness,    Extremity/Skin:-No significant pitting edema,   good pedal pulses, rash and lower extremity skin changes, very scaly/dry skin and open wounds please see photo in epic Neuro-Psych--more lethargic, -  Media Information  Document Information  Photos    08/18/2023 09:30  Attached To:  Hospital Encounter on 09-06-2023  Source Information  Shon Hale, MD  [facility]  Document History      Media Information  Document Information  Photos    08/18/2023 09:30  Attached To:  Hospital Encounter on 09-06-2023  Source Information  Shon Hale, MD  [facility]  Document History     Vitals:   08/18/23 1633 08/18/23 1645 08/18/23 1700 08/18/23 1800  BP:  132/85 122/86 103/81  Pulse:  (!) 134 (!) 135 (!) 131  Resp:  (!) 28 (!) 29 (!) 29  Temp: 98.9 F (37.2 C)     TempSrc: Axillary     SpO2:  99% 99% 99%  Weight:      Height:        Intake/Output Summary (Last 24 hours) at 08/18/2023 1918 Last data filed at 08/18/2023 1522 Gross per 24 hour  Intake 1895.05 ml  Output 400 ml  Net 1495.05 ml   Filed Weights   08/16/23 0500 08/17/23 0429 08/18/23 0403  Weight: 96.6 kg 95.5 kg 96.7 kg   CBC: Recent Labs  Lab 06-Sep-2023 1531 08/13/23  4403 08/14/23 0324 08/15/23 0511 08/16/23 0449 08/17/23 0353 08/18/23 0535  WBC 11.0* 7.5 10.8* 7.7 8.9 8.1 8.2  NEUTROABS 8.9* 5.6  --   --   --   --   --   HGB 12.4* 11.7* 12.1* 11.9* 11.9* 11.3* 12.4*  HCT 33.4* 32.1* 33.5* 35.3* 34.6* 34.7* 37.6*  MCV 90.8 92.8 93.1 96.2 96.9 100.3* 100.3*  PLT 314 253 314 278 305 287 284    Basic Metabolic Panel: Recent Labs  Lab 09/11/2023 1527 09/06/2023 1531 09/09/2023 2119 08/13/23 0517 08/13/23 2249 08/14/23 0324 08/15/23 0621 08/16/23 0449 08/17/23 0353 08/18/23 0535  NA  --  120*   < > 123* 127* 127* 134* 139  --  146*  K  --  2.5*   < > 2.7* 3.3* 3.2* 3.3* 3.4*  --  3.9  CL  --  77*   < > 87* 92* 92* 99 105  --  111  CO2  --  28   < > 29 24 22 25 23   --  20*  GLUCOSE  --  134*   < > 123* 142* 143* 104* 100*  --  122*  BUN  --  32*   < > 24* 13 12 7* 5*  --  15  CREATININE  --  2.21*   < > 1.25* 0.93 0.86 0.82 0.78  --  1.13  CALCIUM  --  8.9   < > 8.1* 8.6* 8.8* 8.5* 8.4*  --  8.9  MG  --  1.9  --  1.9  --  1.8  --   --  1.7  --   PHOS 2.8  --   --   --   --  1.7* 2.4*  --   --  2.4*   < > = values in this interval not displayed.    Studies: DG CHEST PORT 1 VIEW  Result Date: 08/18/2023 CLINICAL DATA:  Dyspnea. History of CHF, hypertension, alcohol usage and COPD. EXAM: PORTABLE CHEST 1 VIEW COMPARISON:  08/17/2023 FINDINGS: Previous median sternotomy and CABG procedure. Stable cardiomediastinal contours. New bilateral pleural effusions identified with pulmonary vascular congestion. Atelectasis noted within both lung bases. IMPRESSION: 1. New bilateral pleural effusions and pulmonary vascular congestion. Correlate for any signs or symptoms of congestive heart failure. 2. Bibasilar atelectasis. Electronically Signed   By: Signa Kell M.D.   On: 08/18/2023 12:38    Scheduled Meds:  aspirin EC  81 mg Oral Daily   Chlorhexidine Gluconate Cloth  6 each Topical Daily   enoxaparin (LOVENOX) injection  40 mg Subcutaneous QPM   fluticasone furoate-vilanterol  1 puff Inhalation Daily   folic acid  1 mg Oral Daily   guaiFENesin  600 mg Oral BID   ipratropium  0.5 mg Nebulization Q6H   LORazepam  2 mg Intravenous Once   multivitamin with minerals  1 tablet Oral Daily   rosuvastatin  40 mg Oral QHS   thiamine  100 mg Oral Daily   Or   thiamine  100 mg Intravenous  Daily   Continuous Infusions:  sodium chloride 10 mL/hr at 08/18/23 1742   sodium chloride Stopped (08/18/23 1210)   dexmedetomidine (PRECEDEX) IV infusion Stopped (08/18/23 1020)   norepinephrine (LEVOPHED) Adult infusion Stopped (08/18/23 1254)   PRN Meds: acetaminophen **OR** acetaminophen, glycopyrrolate, LORazepam **OR** LORazepam, metoprolol tartrate, ondansetron **OR** ondansetron (ZOFRAN) IV, mouth rinse, oxyCODONE  Author: Shon Hale, MD  [facility] 08/18/2023 7:18 PM  To reach On-call, see care teams  to locate the attending and reach out to them via [email or web address]. If 7PM-7AM, please contact night-coverage If you still have difficulty reaching the attending provider, please page the [facility] (Director on Call) for [facility] on amion for assistance.

## 2023-08-18 NOTE — Plan of Care (Signed)
  Problem: Education: Goal: Knowledge of General Education information will improve Description: Including pain rating scale, medication(s)/side effects and non-pharmacologic comfort measures Outcome: Progressing   Problem: Health Behavior/Discharge Planning: Goal: Ability to manage health-related needs will improve Outcome: Progressing   Problem: Clinical Measurements: Goal: Ability to maintain clinical measurements within normal limits will improve Outcome: Progressing Goal: Will remain free from infection Outcome: Progressing Goal: Diagnostic test results will improve Outcome: Progressing Goal: Cardiovascular complication will be avoided Outcome: Progressing   Problem: Coping: Goal: Level of anxiety will decrease Outcome: Progressing   Problem: Elimination: Goal: Will not experience complications related to bowel motility Outcome: Progressing Goal: Will not experience complications related to urinary retention Outcome: Progressing   Problem: Pain Managment: Goal: General experience of comfort will improve Outcome: Progressing   Problem: Safety: Goal: Ability to remain free from injury will improve Outcome: Progressing   Problem: Safety: Goal: Non-violent Restraint(s) Outcome: Progressing

## 2023-08-19 ENCOUNTER — Encounter (HOSPITAL_COMMUNITY): Payer: Self-pay | Admitting: Family Medicine

## 2023-08-19 DIAGNOSIS — Z515 Encounter for palliative care: Secondary | ICD-10-CM | POA: Diagnosis not present

## 2023-08-19 DIAGNOSIS — I471 Supraventricular tachycardia, unspecified: Secondary | ICD-10-CM | POA: Diagnosis not present

## 2023-08-19 DIAGNOSIS — E871 Hypo-osmolality and hyponatremia: Secondary | ICD-10-CM | POA: Diagnosis not present

## 2023-08-19 DIAGNOSIS — L0390 Cellulitis, unspecified: Secondary | ICD-10-CM

## 2023-08-19 DIAGNOSIS — Z7189 Other specified counseling: Secondary | ICD-10-CM

## 2023-08-19 DIAGNOSIS — N179 Acute kidney failure, unspecified: Secondary | ICD-10-CM | POA: Diagnosis not present

## 2023-08-19 DIAGNOSIS — G9340 Encephalopathy, unspecified: Secondary | ICD-10-CM | POA: Diagnosis not present

## 2023-08-19 LAB — COMPREHENSIVE METABOLIC PANEL
ALT: 74 U/L — ABNORMAL HIGH (ref 0–44)
AST: 151 U/L — ABNORMAL HIGH (ref 15–41)
Albumin: 3.2 g/dL — ABNORMAL LOW (ref 3.5–5.0)
Alkaline Phosphatase: 68 U/L (ref 38–126)
Anion gap: 13 (ref 5–15)
BUN: 22 mg/dL (ref 8–23)
CO2: 23 mmol/L (ref 22–32)
Calcium: 9.1 mg/dL (ref 8.9–10.3)
Chloride: 115 mmol/L — ABNORMAL HIGH (ref 98–111)
Creatinine, Ser: 1.46 mg/dL — ABNORMAL HIGH (ref 0.61–1.24)
GFR, Estimated: 50 mL/min — ABNORMAL LOW (ref >60)
Glucose, Bld: 111 mg/dL — ABNORMAL HIGH (ref 70–99)
Potassium: 3.5 mmol/L (ref 3.5–5.1)
Sodium: 151 mmol/L — ABNORMAL HIGH (ref 135–145)
Total Bilirubin: 0.7 mg/dL (ref 0.3–1.2)
Total Protein: 6.9 g/dL (ref 6.5–8.1)

## 2023-08-19 LAB — GLUCOSE, CAPILLARY: Glucose-Capillary: 116 mg/dL — ABNORMAL HIGH (ref 70–99)

## 2023-08-19 LAB — CBC
HCT: 40 % (ref 39.0–52.0)
Hemoglobin: 12.5 g/dL — ABNORMAL LOW (ref 13.0–17.0)
MCH: 32.3 pg (ref 26.0–34.0)
MCHC: 31.3 g/dL (ref 30.0–36.0)
MCV: 103.4 fL — ABNORMAL HIGH (ref 80.0–100.0)
Platelets: 256 10*3/uL (ref 150–400)
RBC: 3.87 MIL/uL — ABNORMAL LOW (ref 4.22–5.81)
RDW: 14.3 % (ref 11.5–15.5)
WBC: 9.8 10*3/uL (ref 4.0–10.5)
nRBC: 0 % (ref 0.0–0.2)

## 2023-08-19 MED ORDER — IPRATROPIUM BROMIDE 0.02 % IN SOLN: 0.5000 mg | Freq: Four times a day (QID) | RESPIRATORY_TRACT | Status: DC | PRN

## 2023-08-19 MED ORDER — IPRATROPIUM BROMIDE 0.02 % IN SOLN
0.5000 mg | Freq: Three times a day (TID) | RESPIRATORY_TRACT | Status: DC
  Administered 2023-08-19 – 2023-08-20 (×6): 0.5 mg via RESPIRATORY_TRACT
  Filled 2023-08-19 (×7): qty 2.5

## 2023-08-19 MED ORDER — AMIODARONE HCL IN DEXTROSE 360-4.14 MG/200ML-% IV SOLN
30.0000 mg/h | INTRAVENOUS | Status: DC
  Administered 2023-08-19 – 2023-08-20 (×4): 30 mg/h via INTRAVENOUS
  Filled 2023-08-19 (×4): qty 200

## 2023-08-19 MED ORDER — SODIUM CHLORIDE 0.9 % IV SOLN: INTRAVENOUS | Status: DC

## 2023-08-19 MED ORDER — AMIODARONE HCL IN DEXTROSE 360-4.14 MG/200ML-% IV SOLN: 60.0000 mg/h | INTRAVENOUS | Status: AC

## 2023-08-19 NOTE — Consult Note (Signed)
Consultation Note Date: 08/19/2023   Patient Name: David Mack  DOB: 1946/01/21  MRN: [id number]  Age / Sex: 77 y.o., male  PCP: Toma Deiters, MD Referring Physician: Shon Hale, MD  Reason for Consultation: Establishing goals of care  HPI/Patient Profile: 77 y.o. 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   Clinical Assessment and Goals of Care: I have reviewed medical records including EPIC notes, labs and imaging, received report from RN, assessed the patient.  [name] is lying quietly in bed.  He appears acutely/chronically ill and frail.  He does not attempt to interact with me in any meaningful way.  He will open his eyes briefly but not make eye contact.  His wife of 49 years, Candace, is present at bedside.  We meet at the bedside to discuss diagnosis prognosis, GOC, EOL wishes, disposition and options.  I introduced Palliative Medicine as specialized medical care for people living with serious illness. It focuses on providing relief from the symptoms and stress of a serious illness. The goal is to improve quality of life for both the patient and the family.  We discussed a brief life review of the patient.  Mr. and Mrs. Bradbury have been married for 49 years.  They had 2 children, a son died at home of an accident.  Mrs. Haslem tells me that her daughter is disabled, but able to complete her own ADLs.  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  They also spent time dirt biking, go-cart racing.  Mrs. York shares that [name] has had great difficulty with mobility over the last year, the last few weeks in particular.  He can walk no more than 30 feet without becoming short of breath and  needing to sit.  We then focused on their current illness.  We talked about his acute and chronic health concerns.  We talked about his acute kidney injury and selected labs.  We talk about the treatment plan.  Overall, Mrs. Kerkhoff seems knowledgeable about her husband's health and medication treatment.  We talked about cardiology consult and the treatment plan.   I share that after 7 days in the hospital, he is not showing meaningful improvements.  We talk about his mental status, his inability to eat or drink.  Mrs. Dornbos shares that she does not believe that her husband would accept a temporary nasal feeding tube, she feels that he would pull it out.  We talk about possible causes of [name]'s encephalopathy.  I shared that if [name] is able to have meaningful recovery, just like I will get sick again so we will he.  Mrs. Wheatley shares her concern about his suffering.  The natural disease trajectory and expectations at EOL were discussed.  I attempted to elicit values and goals of care important to the  patient.  Mrs. Rayas shares that her husband has told her that he is given her 90 years of marriage, but cannot give her a 50th year.  She states that he has been talking about his end-of-life more recently.  When she asks him what is wrong, he would say nothing that can be fixed.  She tells me that a few days ago he was calling out to their deceased son.  The difference between aggressive medical intervention and comfort care was considered in light of the patient's goals of care.  We talked about comfort measures, letting nature take its course.  We talk about prognosis with permission.  I share that without current medical interventions, [name] would have already passed.  I shared that if she were to focus on comfort and dignity, no IV fluids or medications than 5 to 7 days or less would be expected.  Advanced directives, concepts specific to code status, artifical feeding and hydration, and  rehospitalization were considered and discussed.  DNR verified.  We also talked about the concept of "let nature take its course".  Hospice Care services outpatient were explained and offered.  We talked about the benefits of at home and residential hospice care.  Mrs. Schaper states that she has limited experience with hospice.  I ask Mrs. Minehart who helps her through these difficult choices.  She states that her daughter is unable, but she has been talking with her sister.  Discussed the importance of continued conversation with family and the medical providers regarding overall plan of care and treatment options, ensuring decisions are within the context of the patient's values and GOCs. Questions and concerns were addressed.  The family was encouraged to call with questions or concerns.  PMT will continue to support holistically.  Conference with attending, bedside nursing staff, transition of care team related to patient condition, needs, goals of care, disposition.    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  They have 1 child, a disabled daughter.  They had a son who died.    SUMMARY OF RECOMMENDATIONS   At this point continue to treat the treatable but no CPR or intubation Time for outcomes. Considering next steps   Code Status/Advance Care Planning: DNR  Symptom Management:  Per hospitalist, no additional needs at this time.  Palliative Prophylaxis:  Frequent Pain Assessment, Oral Care, Palliative Wound Care, and Turn Reposition  Additional Recommendations (Limitations, Scope, Preferences): Continue to treat but no CPR or intubation  Psycho-social/Spiritual:  Desire for further Chaplaincy support:no Additional Recommendations: Caregiving  Support/Resources and Education on Hospice  Prognosis:  Unable to determine, based on family decision.  If elect comfort care, 5 to 7 days expected.  Even with aggressive care, months at most.  Discharge Planning: To Be  Determined      Primary Diagnoses: Present on Admission:  AKI (acute kidney injury) (HCC)  Hyponatremia  Coronary artery disease   I have reviewed the medical record, interviewed the patient and family, and examined the patient. The following aspects are pertinent.  Past Medical History:  Diagnosis Date   Arthritis    "knees" (11/10/2018)   COPD (chronic obstructive pulmonary disease) (HCC)    Hypertension    Social History   Socioeconomic History   Marital status: Married    Spouse name: Not on file   Number of children: Not on file   Years of education: Not on file   Highest education level: Not on file  Occupational History  Not on file  Tobacco Use   Smoking status: Former    Current packs/day: 0.00    Average packs/day: 0.5 packs/day for 30.0 years (15.0 ttl pk-yrs)    Types: Cigarettes    Start date: 11/04/1988    Quit date: 11/04/2018    Years since quitting: 4.7   Smokeless tobacco: Never   Tobacco comments:    HAS NOT SMOKED SINCE SURGERY  Vaping Use   Vaping status: Never Used  Substance and Sexual Activity   Alcohol use: Not Currently    Alcohol/week: 48.0 standard drinks of alcohol    Types: 48 Cans of beer per week    Comment: have not drank whisky is 2 months - only beer   Drug use: Never   Sexual activity: Not Currently  Other Topics Concern   Not on file  Social History Narrative   Not on file   Social Determinants of Health   Financial Resource Strain: Not on file  Food Insecurity: No Food Insecurity (18-Aug-2023)   Hunger Vital Sign    Worried About Running Out of Food in the Last Year: Never true    Ran Out of Food in the Last Year: Never true  Transportation Needs: No Transportation Needs (Aug 18, 2023)   PRAPARE - Administrator, Civil Service (Medical): No    Lack of Transportation (Non-Medical): No  Physical Activity: Not on file  Stress: Not on file  Social Connections: Not on file   Family History  Problem Relation  Age of Onset   Alzheimer's disease Mother    Heart disease Mother        diagnosed in older age   Scheduled Meds:  aspirin EC  81 mg Oral Daily   Chlorhexidine Gluconate Cloth  6 each Topical Daily   enoxaparin (LOVENOX) injection  40 mg Subcutaneous QPM   fluticasone furoate-vilanterol  1 puff Inhalation Daily   folic acid  1 mg Oral Daily   guaiFENesin  600 mg Oral BID   ipratropium  0.5 mg Nebulization TID   LORazepam  2 mg Intravenous Once   multivitamin with minerals  1 tablet Oral Daily   rosuvastatin  40 mg Oral QHS   thiamine  100 mg Oral Daily   Or   thiamine  100 mg Intravenous Daily   Continuous Infusions:  sodium chloride 10 mL/hr at 08/18/23 1742   sodium chloride Stopped (08/18/23 1210)   amiodarone     amiodarone 30 mg/hr (08/19/23 1001)   dexmedetomidine (PRECEDEX) IV infusion Stopped (08/18/23 1020)   norepinephrine (LEVOPHED) Adult infusion Stopped (08/18/23 1254)   PRN Meds:.acetaminophen **OR** acetaminophen, glycopyrrolate, ipratropium, LORazepam **OR** LORazepam, metoprolol tartrate, ondansetron **OR** ondansetron (ZOFRAN) IV, mouth rinse, oxyCODONE Medications Prior to Admission:  Prior to Admission medications   Medication Sig Start Date End Date Taking? Authorizing Provider  aspirin EC 81 MG tablet Take 1 tablet (81 mg total) by mouth daily. 05/06/19  Yes BranchDorothe Pea, MD  ATROVENT HFA 17 MCG/ACT inhaler Inhale 1 puff into the lungs every 6 (six) hours as needed for wheezing. 06/20/23  Yes [provider]  carvedilol (COREG) 6.25 MG tablet Take 1 tablet (6.25 mg total) by mouth 2 (two) times daily with a meal. Patient taking differently: Take 6.25 mg by mouth daily. 01/06/19  Yes Dyann Kief, PA-C  diclofenac Sodium (VOLTAREN ARTHRITIS PAIN) 1 % GEL Apply 4 g topically every other day.   Yes [provider]  guaiFENesin (MUCINEX) 600 MG 12 hr  tablet Take 1,200 mg by mouth 2 (two) times daily.   Yes [provider]   metolazone (ZAROXOLYN) 2.5 MG tablet Take 1 tablet (2.5 mg total) by mouth daily as needed. Patient taking differently: Take 2.5 mg by mouth every other day. 12/28/18 August 14, 2023 Yes Dyann Kief, PA-C  polyethylene glycol (MIRALAX / GLYCOLAX) 17 g packet Take 17 g by mouth daily as needed for mild constipation.   Yes [provider]  potassium chloride SA (KLOR-CON) 20 MEQ tablet Take 1 tablet (20 mEq total) by mouth daily. 09/27/19  Yes BranchDorothe Pea, MD  rosuvastatin (CRESTOR) 40 MG tablet Take 40 mg by mouth at bedtime.   Yes [provider]  sacubitril-valsartan (ENTRESTO) 49-51 MG Take 1 tablet by mouth 2 (two) times daily.   Yes [provider]  torsemide (DEMADEX) 20 MG tablet TAKE 40 MG (2 TABLETS) DAILY. MAY TAKE AN EXTRA 20 MG DAILY AS NEEDED FOR SWELLING. Patient taking differently: Take 20 mg by mouth See admin instructions. TAKE 40 MG (2 TABLETS) DAILY. MAY TAKE AN EXTRA 20 MG DAILY AS NEEDED FOR SWELLING. 07/30/19  Yes BranchDorothe Pea, MD   Allergies  Allergen Reactions   Albuterol Nausea And Vomiting and Other (See Comments)    Dizziness - unable to stand   Pulmicort [Budesonide] Other (See Comments)    Makes very jittery and feels worse rather than better   Review of Systems  Unable to perform ROS: Acuity of condition    Physical Exam Vitals and nursing note reviewed.  Constitutional:      General: He is not in acute distress.    Appearance: He is ill-appearing.  Cardiovascular:     Rate and Rhythm: Tachycardia present.  Pulmonary:     Effort: Pulmonary effort is normal. No respiratory distress.  Skin:    General: Skin is warm and dry.     Findings: Bruising and erythema present.     Vital Signs: BP (!) 140/98   Pulse (!) 132   Temp 99.3 F (37.4 C) (Axillary)   Resp (!) 28   Ht 5\' 6"  (1.676 m)   Wt 96.5 kg   SpO2 97%   BMI 34.34 kg/m  Pain Scale: CPOT POSS *See Group Information*: 2-Acceptable,Slightly drowsy, easily  aroused Pain Score: Asleep   SpO2: SpO2: 97 % O2 Device:SpO2: 97 % O2 Flow Rate: .O2 Flow Rate (L/min): 1 L/min  IO: Intake/output summary:  Intake/Output Summary (Last 24 hours) at 08/19/2023 1254 Last data filed at 08/19/2023 1100 Gross per 24 hour  Intake 601.89 ml  Output 1400 ml  Net -798.11 ml    LBM: Last BM Date : 08/15/23 Baseline Weight: Weight: 95.3 kg Most recent weight: Weight: 96.5 kg     Palliative Assessment/Data:     Time In: 0810 Time Out: 0925 Time Total: 75 minutes  Greater than 50%  of this time was spent counseling and coordinating care related to the above assessment and plan.  Signed by: Katheran Awe, NP   Please contact Palliative Medicine Team phone at [phone] for questions and concerns.  For individual provider: See Loretha Stapler

## 2023-08-19 NOTE — Progress Notes (Addendum)
Progress Note  Patient Name: David Mack Date of Encounter: 08/19/2023  Primary Cardiologist: Chrystie Nose, MD  Subjective   No acute events overnight, intermittent atrial arrhythmias, patient somnolent.  Off Precedex drip and off norepinephrine drip.  Inpatient Medications    Scheduled Meds:  aspirin EC  81 mg Oral Daily   Chlorhexidine Gluconate Cloth  6 each Topical Daily   enoxaparin (LOVENOX) injection  40 mg Subcutaneous QPM   fluticasone furoate-vilanterol  1 puff Inhalation Daily   folic acid  1 mg Oral Daily   guaiFENesin  600 mg Oral BID   ipratropium  0.5 mg Nebulization TID   LORazepam  2 mg Intravenous Once   multivitamin with minerals  1 tablet Oral Daily   rosuvastatin  40 mg Oral QHS   thiamine  100 mg Oral Daily   Or   thiamine  100 mg Intravenous Daily   Continuous Infusions:  sodium chloride 10 mL/hr at 08/18/23 1742   sodium chloride Stopped (08/18/23 1210)   dexmedetomidine (PRECEDEX) IV infusion Stopped (08/18/23 1020)   norepinephrine (LEVOPHED) Adult infusion Stopped (08/18/23 1254)   PRN Meds: acetaminophen **OR** acetaminophen, glycopyrrolate, ipratropium, LORazepam **OR** LORazepam, metoprolol tartrate, ondansetron **OR** ondansetron (ZOFRAN) IV, mouth rinse, oxyCODONE   Vital Signs    Vitals:   08/19/23 0800 08/19/23 0821 08/19/23 0822 08/19/23 0830  BP: (!) 144/91   137/83  Pulse: (!) 139 (!) 139 (!) 139 (!) 131  Resp: (!) 27 (!) 27 (!) 27 (!) 25  Temp:      TempSrc:      SpO2: 92% 91% 93% 96%  Weight:      Height:        Intake/Output Summary (Last 24 hours) at 08/19/2023 0928 Last data filed at 08/19/2023 0837 Gross per 24 hour  Intake 1320.47 ml  Output 1400 ml  Net -79.53 ml   Filed Weights   08/17/23 0429 08/18/23 0403 08/19/23 0500  Weight: 95.5 kg 96.7 kg 96.5 kg    Telemetry     Personally reviewed, atrial arrhythmias consistent with atrial tachycardia versus atypical atrial flutter, has a typical stepup  pattern.  ECG    Not performed today  Physical Exam   GEN: No acute distress.   Neck: No JVD. Cardiac: RRR, no murmur, rub, or gallop.  Respiratory: Nonlabored. Clear to auscultation bilaterally. GI: Soft, nontender, bowel sounds present. MS: No edema; No deformity. Neuro:  Nonfocal. Psych: Somnolent, not arousable.  Labs    Chemistry Recent Labs  Lab September 07, 2023 1531 Sep 07, 2023 2119 08/13/23 0517 08/13/23 2249 08/15/23 0621 08/16/23 0449 08/18/23 0535 08/19/23 0440  NA 120*   < > 123*   < > 134* 139 146* 151*  K 2.5*   < > 2.7*   < > 3.3* 3.4* 3.9 3.5  CL 77*   < > 87*   < > 99 105 111 115*  CO2 28   < > 29   < > 25 23 20* 23  GLUCOSE 134*   < > 123*   < > 104* 100* 122* 111*  BUN 32*   < > 24*   < > 7* 5* 15 22  CREATININE 2.21*   < > 1.25*   < > 0.82 0.78 1.13 1.46*  CALCIUM 8.9   < > 8.1*   < > 8.5* 8.4* 8.9 9.1  PROT 7.8  --  6.4*  --   --   --   --  6.9  ALBUMIN 3.7  --  3.0*  --  3.2*  --  3.2* 3.2*  AST 20  --  18  --   --   --   --  151*  ALT 11  --  12  --   --   --   --  74*  ALKPHOS 64  --  52  --   --   --   --  68  BILITOT 0.7  --  0.6  --   --   --   --  0.7  GFRNONAA 30*   < > 60*   < > >60 >60 >60 50*  ANIONGAP 15   < > 7   < > 10 11 15 13    < > = values in this interval not displayed.     Hematology Recent Labs  Lab 08/17/23 0353 08/18/23 0535 08/19/23 0440  WBC 8.1 8.2 9.8  RBC 3.46* 3.75* 3.87*  HGB 11.3* 12.4* 12.5*  HCT 34.7* 37.6* 40.0  MCV 100.3* 100.3* 103.4*  MCH 32.7 33.1 32.3  MCHC 32.6 33.0 31.3  RDW 13.7 14.3 14.3  PLT 287 284 256    Cardiac Enzymes Recent Labs  Lab 08/20/2023 2119 08/18/23 1258 08/18/23 1450  TROPONINIHS 7 66* 58*    BNP Recent Labs  Lab 08/22/2023 1531  BNP 48.0     DDimerNo results for input(s): "DDIMER" in the last 168 hours.   Radiology    DG CHEST PORT 1 VIEW  Result Date: 08/18/2023 CLINICAL DATA:  Dyspnea. History of CHF, hypertension, alcohol usage and COPD. EXAM: PORTABLE CHEST 1  VIEW COMPARISON:  08/17/2023 FINDINGS: Previous median sternotomy and CABG procedure. Stable cardiomediastinal contours. New bilateral pleural effusions identified with pulmonary vascular congestion. Atelectasis noted within both lung bases. IMPRESSION: 1. New bilateral pleural effusions and pulmonary vascular congestion. Correlate for any signs or symptoms of congestive heart failure. 2. Bibasilar atelectasis. Electronically Signed   By: Signa Kell M.D.   On: 08/18/2023 12:38   DG CHEST PORT 1 VIEW  Result Date: 08/17/2023 CLINICAL DATA:  77 year old male with history of dyspnea. EXAM: PORTABLE CHEST 1 VIEW COMPARISON:  Chest x-ray 08/14/2023. FINDINGS: Lung volumes are low. No consolidative airspace disease. Mild diffuse interstitial prominence and peribronchial cuffing appears chronic and similar to prior studies. No pleural effusions. No pneumothorax. No pulmonary nodule or mass noted. Pulmonary vasculature and the cardiomediastinal silhouette are within normal limits. Atherosclerotic calcifications are noted in the thoracic aorta. Status post median sternotomy for CABG. IMPRESSION: 1. Low lung volumes without radiographic evidence of acute cardiopulmonary disease. 2. Aortic atherosclerosis. Electronically Signed   By: Trudie Reed M.D.   On: 08/17/2023 14:01     Assessment & Plan   Atrial arrhythmias Acute encephalopathy secondary delirium tremens, off Precedex drip Right lower extremity cellulitis on antibiotics CAD status post CABG in 2020   -Telemetry reviewed, has typical stepup pattern consistent with arrhythmias, atrial tachycardia versus atypical atrial flutter.  Baseline EKG showed NSR, first-degree AV block.  Keep K>4 and <5, Mg>2 and <3; chest x-ray yesterday showed pulmonary venous congestion, IV Lasix 20 mg was administered with resultant AKI and gradually uptrending serum sodium levels.  Patient appears to be more on the dry side despite +9L fluid balance since admission.   Defer NG tube, hydration and nutrition to the primary team.  Recommend to start amiodarone maintenance drip to control the atrial arrhythmias until underlying etiologies like hydration, nutrition are addressed. -Continue cardioprotective medications with aspirin 81 mg once daily, rosuvastatin 40  mg nightly. -On antibiotics for cellulitis management, per primary team.  Delirium tremens management per primary team.  Off Precedex drip now.   CRITICAL CARE Performed by: Orion Modest Trena Dunavan   Total critical care time: 35 minutes  Critical care time was exclusive of separately billable procedures and treating other patients.  Critical care was necessary to treat or prevent imminent or life-threatening deterioration.  Critical care was time spent personally by me on the following activities: development of treatment plan with patient and/or surrogate as well as nursing, discussions with consultants, evaluation of patient's response to treatment, examination of patient, obtaining history from patient or surrogate, ordering and performing treatments and interventions, ordering and review of laboratory studies, ordering and review of radiographic studies, pulse oximetry and re-evaluation of patient's condition.    Signed, Marjo Bicker, MD  08/19/2023, 9:28 AM

## 2023-08-19 NOTE — Progress Notes (Signed)
way back on his alcohol usage -Hyponatremia and AKI could have been contributing to his delirium/mental status changes as well -Chest x-ray and Blood cultures Not suggestive of infection -Concerns  for possible cellulitis of the legs-currently on Abx as below in #5 -Patient remained quite agitated despite benzos C/n  iv Precedex for delirium/behavioral issues and  agitation  -Wrist restraints -Continue multivitamin, thiamine and folic acid -Ammonia is not elevated -Continue lorazepam per CIWA protocol  5)Lower extremity cellulitis--- please see photos in epic -Actually no significant pitting edema at this time--patient is actually dry with scaly skin -Lower extremity Dopplers without DVT -Echo and chest x-ray and BNP Not suggestive of acute CHF Patient completed iv  Rocephin -Stopped Flagyl WBC 11.0 >>10.8 >>7.7>.8.9>>8.2>>9.8 -Blood cultures from 2023-09-02 and repeat blood culture from 08/18/2023 NGTD -No further antibiotics indicated at this time  6) hypokalemia/hypophosphatemia--replace and recheck electrolytes  7) tachycardia--episodes of atrial tachycardia noted -Serial EKGs noted -Cardiology consult appreciated -Per cardiology start IV amiodarone drip without bolus on 08/19/2023 -As needed metoprolol as ordered  8) hypotension--- in the setting of tachycardia -Patient briefly required IV Levophed drip on 08/18/2023 -Weaned off Levophed at this time -Cardiology input appreciated -Continue IV fluids as above  Coronary artery disease - Continue aspirin, beta-blocker and statin--when awake enough to take oral medications - Holding Entresto due to soft pressures at presentation   COPD exacerbation,--hold off on steroids given mental status concerns -Continue bronchodilators ABG reviewed, within normal range  Body mass index is 34.34 kg/m.  Interventions:   Leg wounds--- RT >> Lt  Wound / Incision (Open or Dehisced) 02-Sep-2023 Skin tear Foot Anterior;Left small skin tear,patient said its from scratching the abrasions (Active)  Date First Assessed/Time First Assessed: 09-02-2023 2318   Wound Type: Skin tear  Location: Foot  Location Orientation: Anterior;Left  Wound Description (Comments): small skin tear,patient said its from scratching the abrasions  Present on Admission: Yes    Assessments 09-02-2023 11:19 PM 08/19/2023  8:00 AM   Dressing Type Foam - Lift dressing to assess site every shift Foam - Lift dressing to assess site every shift  Dressing Changed Changed Reinforced  Dressing Status Clean, Dry, Intact Clean, Dry, Intact  Dressing Change Frequency PRN PRN  Site / Wound Assessment Clean;Dry;Pink Pink;Dry  Peri-wound Assessment Intact --  Wound Length (cm) 1.5 cm --  Wound Width (cm) 1.5 cm --  Wound Surface Area (cm^2) 2.25 cm^2 --  Margins Unattached edges (unapproximated) --  Closure None None  Drainage Amount None None  Treatment Cleansed --     No associated orders.     Wound / Incision (Open or Dehisced) 2023-09-02 Other (Comment) Pretibial Right looks more like a cellulitis, redness associated with swelling in the entire right leg below the knees associated with active seeping of wound with serosanguinous fluid (Active)  Date First Assessed/Time First Assessed: September 02, 2023 2230   Wound Type: Other (Comment)  Location: Pretibial  Location Orientation: Right  Wound Description (Comments): looks more like a cellulitis, redness associated with swelling in the entire right le...    Assessments 08/13/2023  1:24 AM 08/19/2023  8:00 AM  Wound Image     Dressing Type Compression wrap Other (Comment)  Dressing Changed Changed Reinforced  Dressing Status Old drainage;Other (Comment) Clean, Dry, Intact  Dressing Change Frequency PRN --  Site / Wound Assessment Pink;Red;Other (Comment) Dressing in place / Unable to assess  Peri-wound Assessment Erythema (non-blanchable);Pink;Maceration --  Drainage Amount Moderate None  Drainage Description Serosanguineous --  Non-staged  [facility] Progress Note  Patient: David Mack    VHQ:[id number]  DOA: Aug 27, 2023     Date of Service: the patient was seen and examined on 08/19/2023  Chief Complaint  Patient presents with   Fluid Overload (CHF)   Brief hospital course: David JOINES is a 77 y.o. male with medical history significant of COPD, alcohol use, hypertension, CHF, and more presents the ED with a chief complaint of fluid overload.  Patient reports that he has been retaining fluid in his right leg and his right leg has been weeping.  He reports has been retaining fluid in that leg for a long time but it got worse over the last 2 weeks.  He denies any pain or numbness in that leg.  There is associated erythema in that leg.  Patient is not sure when that started.  When the leg drains, it is clear fluid, no purulence.  He has dyspnea but it is no worse than his normal dyspnea.  He does not wear oxygen at home.  He has not had any chest pain, palpitations.  He denies orthopnea.  He reports compliance with his torsemide.  Patient was seen at an outpatient clinic and sent to the ER for cellulitis/CHF workup. - Patient does not smoke.  He used to drink up to a sixpack of beer per day, was drinking much less just prior to admission -Patient's wife is a Designer, jewellery  Assessment and Plan: 1)AKI----acute kidney injury --POA  -AKI due to dehydration and poor oral intake compounded by torsemide and Entresto use prior to admission creatinine on admission=2.21 on Aug 27, 2023 - baseline creatinine = 0.86 (2020)     -creatinine has normalized with hydration 08/19/23 Creatinine trending up after holding IV fluids and giving 1 dose of IV Lasix 20 mg on 08/18/2023, patient also had episode of transient hypotension on 08/18/2023 requiring IV Levophed drip suspect some component of ATN --renally adjust medications, avoid nephrotoxic agents / dehydration  / hypotension  2) hypernatremia -On admission patient had Hypotonic  hyponatremia/hypochloremia partly due to beer potomania, compounded poor intake and torsemide use Serum osmolality 273, slightly low -Sodium was as low as 120--- -sodium improved with hydration,  -Over the last 48 hours sodium has been trending up now up to 151 (on 08/18/2023 we had had IV fluids and given 1 dose of Lasix 20 mg IV due to chest x-ray findings and increased hypoxia)--after holding IV fluids and giving low-dose Lasix on 08/18/2023 sodium went up again -Clinically patient appears dry -No further diuretics at this time -Restart IV fluids--patient is way too lethargic to drink or eat  3)HFrEF- -repeat echo from 08/13/2023 with EF of 50 to 55% without wall motion abnormalities, no diastolic dysfunction -BNP on admission is 48 -Chest x-ray on Aug 27, 2023 and repeat chest x-ray on 08/14/2023 without CHF concerns or other acute findings -Continue to hold Coreg, torsemide and Entresto for now due to soft BP and AKI  -May use IV metoprolol as needed tachycardia -Chest x-ray from 08/17/2023 without acute findings 08/19/23 Chest x-ray on 08/18/2023 for possible volume overload/pulmonary venous congestion -Cardiology consult appreciated -On 08/18/2023 we held  IV fluids and gave Lasix 20 mg IV x 1 -Creatinine and sodium trending up -Clinically suspect patient is dry , despite chest x-ray findings from 08/18/2023 -Hypoxia has improved -Recheck chest x-ray on 08/20/2023  4)Delirium--suspect some component of DTs -Wife (retired Charity fundraiser) reports similar presentation back at [facility] in 2020 -Apparently prior to admission patient had cut  way back on his alcohol usage -Hyponatremia and AKI could have been contributing to his delirium/mental status changes as well -Chest x-ray and Blood cultures Not suggestive of infection -Concerns  for possible cellulitis of the legs-currently on Abx as below in #5 -Patient remained quite agitated despite benzos C/n  iv Precedex for delirium/behavioral issues and  agitation  -Wrist restraints -Continue multivitamin, thiamine and folic acid -Ammonia is not elevated -Continue lorazepam per CIWA protocol  5)Lower extremity cellulitis--- please see photos in epic -Actually no significant pitting edema at this time--patient is actually dry with scaly skin -Lower extremity Dopplers without DVT -Echo and chest x-ray and BNP Not suggestive of acute CHF Patient completed iv  Rocephin -Stopped Flagyl WBC 11.0 >>10.8 >>7.7>.8.9>>8.2>>9.8 -Blood cultures from 2023-09-02 and repeat blood culture from 08/18/2023 NGTD -No further antibiotics indicated at this time  6) hypokalemia/hypophosphatemia--replace and recheck electrolytes  7) tachycardia--episodes of atrial tachycardia noted -Serial EKGs noted -Cardiology consult appreciated -Per cardiology start IV amiodarone drip without bolus on 08/19/2023 -As needed metoprolol as ordered  8) hypotension--- in the setting of tachycardia -Patient briefly required IV Levophed drip on 08/18/2023 -Weaned off Levophed at this time -Cardiology input appreciated -Continue IV fluids as above  Coronary artery disease - Continue aspirin, beta-blocker and statin--when awake enough to take oral medications - Holding Entresto due to soft pressures at presentation   COPD exacerbation,--hold off on steroids given mental status concerns -Continue bronchodilators ABG reviewed, within normal range  Body mass index is 34.34 kg/m.  Interventions:   Leg wounds--- RT >> Lt  Wound / Incision (Open or Dehisced) 02-Sep-2023 Skin tear Foot Anterior;Left small skin tear,patient said its from scratching the abrasions (Active)  Date First Assessed/Time First Assessed: 09-02-2023 2318   Wound Type: Skin tear  Location: Foot  Location Orientation: Anterior;Left  Wound Description (Comments): small skin tear,patient said its from scratching the abrasions  Present on Admission: Yes    Assessments 09-02-2023 11:19 PM 08/19/2023  8:00 AM   Dressing Type Foam - Lift dressing to assess site every shift Foam - Lift dressing to assess site every shift  Dressing Changed Changed Reinforced  Dressing Status Clean, Dry, Intact Clean, Dry, Intact  Dressing Change Frequency PRN PRN  Site / Wound Assessment Clean;Dry;Pink Pink;Dry  Peri-wound Assessment Intact --  Wound Length (cm) 1.5 cm --  Wound Width (cm) 1.5 cm --  Wound Surface Area (cm^2) 2.25 cm^2 --  Margins Unattached edges (unapproximated) --  Closure None None  Drainage Amount None None  Treatment Cleansed --     No associated orders.     Wound / Incision (Open or Dehisced) 2023-09-02 Other (Comment) Pretibial Right looks more like a cellulitis, redness associated with swelling in the entire right leg below the knees associated with active seeping of wound with serosanguinous fluid (Active)  Date First Assessed/Time First Assessed: September 02, 2023 2230   Wound Type: Other (Comment)  Location: Pretibial  Location Orientation: Right  Wound Description (Comments): looks more like a cellulitis, redness associated with swelling in the entire right le...    Assessments 08/13/2023  1:24 AM 08/19/2023  8:00 AM  Wound Image     Dressing Type Compression wrap Other (Comment)  Dressing Changed Changed Reinforced  Dressing Status Old drainage;Other (Comment) Clean, Dry, Intact  Dressing Change Frequency PRN --  Site / Wound Assessment Pink;Red;Other (Comment) Dressing in place / Unable to assess  Peri-wound Assessment Erythema (non-blanchable);Pink;Maceration --  Drainage Amount Moderate None  Drainage Description Serosanguineous --  Non-staged  way back on his alcohol usage -Hyponatremia and AKI could have been contributing to his delirium/mental status changes as well -Chest x-ray and Blood cultures Not suggestive of infection -Concerns  for possible cellulitis of the legs-currently on Abx as below in #5 -Patient remained quite agitated despite benzos C/n  iv Precedex for delirium/behavioral issues and  agitation  -Wrist restraints -Continue multivitamin, thiamine and folic acid -Ammonia is not elevated -Continue lorazepam per CIWA protocol  5)Lower extremity cellulitis--- please see photos in epic -Actually no significant pitting edema at this time--patient is actually dry with scaly skin -Lower extremity Dopplers without DVT -Echo and chest x-ray and BNP Not suggestive of acute CHF Patient completed iv  Rocephin -Stopped Flagyl WBC 11.0 >>10.8 >>7.7>.8.9>>8.2>>9.8 -Blood cultures from 2023-09-02 and repeat blood culture from 08/18/2023 NGTD -No further antibiotics indicated at this time  6) hypokalemia/hypophosphatemia--replace and recheck electrolytes  7) tachycardia--episodes of atrial tachycardia noted -Serial EKGs noted -Cardiology consult appreciated -Per cardiology start IV amiodarone drip without bolus on 08/19/2023 -As needed metoprolol as ordered  8) hypotension--- in the setting of tachycardia -Patient briefly required IV Levophed drip on 08/18/2023 -Weaned off Levophed at this time -Cardiology input appreciated -Continue IV fluids as above  Coronary artery disease - Continue aspirin, beta-blocker and statin--when awake enough to take oral medications - Holding Entresto due to soft pressures at presentation   COPD exacerbation,--hold off on steroids given mental status concerns -Continue bronchodilators ABG reviewed, within normal range  Body mass index is 34.34 kg/m.  Interventions:   Leg wounds--- RT >> Lt  Wound / Incision (Open or Dehisced) 02-Sep-2023 Skin tear Foot Anterior;Left small skin tear,patient said its from scratching the abrasions (Active)  Date First Assessed/Time First Assessed: 09-02-2023 2318   Wound Type: Skin tear  Location: Foot  Location Orientation: Anterior;Left  Wound Description (Comments): small skin tear,patient said its from scratching the abrasions  Present on Admission: Yes    Assessments 09-02-2023 11:19 PM 08/19/2023  8:00 AM   Dressing Type Foam - Lift dressing to assess site every shift Foam - Lift dressing to assess site every shift  Dressing Changed Changed Reinforced  Dressing Status Clean, Dry, Intact Clean, Dry, Intact  Dressing Change Frequency PRN PRN  Site / Wound Assessment Clean;Dry;Pink Pink;Dry  Peri-wound Assessment Intact --  Wound Length (cm) 1.5 cm --  Wound Width (cm) 1.5 cm --  Wound Surface Area (cm^2) 2.25 cm^2 --  Margins Unattached edges (unapproximated) --  Closure None None  Drainage Amount None None  Treatment Cleansed --     No associated orders.     Wound / Incision (Open or Dehisced) 2023-09-02 Other (Comment) Pretibial Right looks more like a cellulitis, redness associated with swelling in the entire right leg below the knees associated with active seeping of wound with serosanguinous fluid (Active)  Date First Assessed/Time First Assessed: September 02, 2023 2230   Wound Type: Other (Comment)  Location: Pretibial  Location Orientation: Right  Wound Description (Comments): looks more like a cellulitis, redness associated with swelling in the entire right le...    Assessments 08/13/2023  1:24 AM 08/19/2023  8:00 AM  Wound Image     Dressing Type Compression wrap Other (Comment)  Dressing Changed Changed Reinforced  Dressing Status Old drainage;Other (Comment) Clean, Dry, Intact  Dressing Change Frequency PRN --  Site / Wound Assessment Pink;Red;Other (Comment) Dressing in place / Unable to assess  Peri-wound Assessment Erythema (non-blanchable);Pink;Maceration --  Drainage Amount Moderate None  Drainage Description Serosanguineous --  Non-staged

## 2023-08-20 ENCOUNTER — Inpatient Hospital Stay (HOSPITAL_COMMUNITY): Payer: Medicare Other

## 2023-08-20 DIAGNOSIS — I25810 Atherosclerosis of coronary artery bypass graft(s) without angina pectoris: Secondary | ICD-10-CM | POA: Diagnosis not present

## 2023-08-20 DIAGNOSIS — L03119 Cellulitis of unspecified part of limb: Secondary | ICD-10-CM | POA: Diagnosis not present

## 2023-08-20 DIAGNOSIS — Z515 Encounter for palliative care: Secondary | ICD-10-CM | POA: Diagnosis not present

## 2023-08-20 DIAGNOSIS — L03115 Cellulitis of right lower limb: Secondary | ICD-10-CM

## 2023-08-20 DIAGNOSIS — R600 Localized edema: Secondary | ICD-10-CM | POA: Diagnosis not present

## 2023-08-20 DIAGNOSIS — G9340 Encephalopathy, unspecified: Secondary | ICD-10-CM | POA: Diagnosis not present

## 2023-08-20 DIAGNOSIS — E871 Hypo-osmolality and hyponatremia: Secondary | ICD-10-CM | POA: Diagnosis not present

## 2023-08-20 DIAGNOSIS — I5022 Chronic systolic (congestive) heart failure: Secondary | ICD-10-CM | POA: Diagnosis not present

## 2023-08-20 DIAGNOSIS — I471 Supraventricular tachycardia, unspecified: Secondary | ICD-10-CM | POA: Diagnosis not present

## 2023-08-20 DIAGNOSIS — N179 Acute kidney failure, unspecified: Secondary | ICD-10-CM | POA: Diagnosis not present

## 2023-08-20 DIAGNOSIS — Z7189 Other specified counseling: Secondary | ICD-10-CM | POA: Diagnosis not present

## 2023-08-20 LAB — CBC
HCT: 38 % — ABNORMAL LOW (ref 39.0–52.0)
Hemoglobin: 12.1 g/dL — ABNORMAL LOW (ref 13.0–17.0)
MCH: 32.4 pg (ref 26.0–34.0)
MCHC: 31.8 g/dL (ref 30.0–36.0)
MCV: 101.6 fL — ABNORMAL HIGH (ref 80.0–100.0)
Platelets: 206 10*3/uL (ref 150–400)
RBC: 3.74 MIL/uL — ABNORMAL LOW (ref 4.22–5.81)
RDW: 14.6 % (ref 11.5–15.5)
WBC: 11.2 10*3/uL — ABNORMAL HIGH (ref 4.0–10.5)
nRBC: 0.4 % — ABNORMAL HIGH (ref 0.0–0.2)

## 2023-08-20 LAB — COMPREHENSIVE METABOLIC PANEL
ALT: 80 U/L — ABNORMAL HIGH (ref 0–44)
AST: 112 U/L — ABNORMAL HIGH (ref 15–41)
Albumin: 2.9 g/dL — ABNORMAL LOW (ref 3.5–5.0)
Alkaline Phosphatase: 63 U/L (ref 38–126)
Anion gap: 12 (ref 5–15)
BUN: 18 mg/dL (ref 8–23)
CO2: 23 mmol/L (ref 22–32)
Calcium: 9 mg/dL (ref 8.9–10.3)
Chloride: 119 mmol/L — ABNORMAL HIGH (ref 98–111)
Creatinine, Ser: 1.38 mg/dL — ABNORMAL HIGH (ref 0.61–1.24)
GFR, Estimated: 53 mL/min — ABNORMAL LOW (ref >60)
Glucose, Bld: 121 mg/dL — ABNORMAL HIGH (ref 70–99)
Potassium: 3.1 mmol/L — ABNORMAL LOW (ref 3.5–5.1)
Sodium: 154 mmol/L — ABNORMAL HIGH (ref 135–145)
Total Bilirubin: 0.8 mg/dL (ref 0.3–1.2)
Total Protein: 6.7 g/dL (ref 6.5–8.1)

## 2023-08-20 LAB — GLUCOSE, CAPILLARY
Glucose-Capillary: 130 mg/dL — ABNORMAL HIGH (ref 70–99)
Glucose-Capillary: 106 mg/dL — ABNORMAL HIGH (ref 70–99)

## 2023-08-20 LAB — AMMONIA: Ammonia: 18 umol/L (ref 9–35)

## 2023-08-20 MED ORDER — FUROSEMIDE 10 MG/ML IJ SOLN: 40.0000 mg | Freq: Once | INTRAMUSCULAR | Status: DC

## 2023-08-20 MED ORDER — FLUTICASONE FUROATE-VILANTEROL 200-25 MCG/ACT IN AEPB: 1.0000 | INHALATION_SPRAY | Freq: Every day | RESPIRATORY_TRACT | Status: DC | PRN

## 2023-08-20 MED ORDER — POTASSIUM CHLORIDE 10 MEQ/100ML IV SOLN
10.0000 meq | INTRAVENOUS | Status: AC
  Administered 2023-08-20 (×3): 10 meq via INTRAVENOUS
  Filled 2023-08-20 (×2): qty 100

## 2023-08-20 MED ORDER — MAGNESIUM SULFATE IN D5W 1-5 GM/100ML-% IV SOLN
1.0000 g | Freq: Once | INTRAVENOUS | Status: DC
Start: 2023-08-20 — End: 2023-08-20

## 2023-08-20 NOTE — Progress Notes (Signed)
[facility] Progress Note  Patient: David Mack    BMW:[id number]  DOA: 09/04/2023     Date of Service: the patient was seen and examined on 08/20/2023  Chief Complaint  Patient presents with   Fluid Overload (CHF)   Brief hospital course: David Mack is a 77 y.o. male with medical history significant of COPD, alcohol use, hypertension, CHF, and more presents the ED with a chief complaint of fluid overload.  Patient reports that he has been retaining fluid in his right leg and his right leg has been weeping.  He reports has been retaining fluid in that leg for a long time but it got worse over the last 2 weeks.  He denies any pain or numbness in that leg.  There is associated erythema in that leg.  Patient is not sure when that started.  When the leg drains, it is clear fluid, no purulence.  He has dyspnea but it is no worse than his normal dyspnea.  He does not wear oxygen at home.  He has not had any chest pain, palpitations.  He denies orthopnea.  He reports compliance with his torsemide.  Patient was seen at an outpatient clinic and sent to the ER for cellulitis/CHF workup. - Patient does not smoke.  He used to drink up to a sixpack of beer per day, was drinking much less just prior to admission -Patient's wife is a Designer, jewellery  Assessment and Plan: 1)AKI----acute kidney injury --POA  -AKI due to dehydration and poor oral intake compounded by torsemide and Entresto use prior to admission creatinine on admission=2.21 on 08/31/2023 - baseline creatinine = 0.86 (2020)     -creatinine has normalized with hydration 08/19/23 Creatinine trending up after holding IV fluids and giving 1 dose of IV Lasix 20 mg on 08/18/2023, patient also had episode of transient hypotension on 08/18/2023 requiring IV Levophed drip suspect some component of ATN --continue to renally adjust medications, avoid nephrotoxic agents / dehydration  / hypotension  2) hypernatremia -On admission patient had  Hypotonic hyponatremia/hypochloremia partly due to beer potomania, compounded poor intake and torsemide use Serum osmolality 273, slightly low -Sodium was as low as 120--- -sodium improved with hydration,  -Over the last 48 hours sodium has been trending up now up to 151 (on 08/18/2023 we had had IV fluids and given 1 dose of Lasix 20 mg IV due to chest x-ray findings and increased hypoxia)--after holding IV fluids and giving low-dose Lasix on 08/18/2023 sodium went up again -Clinically patient appears dry -No further diuretics at this time -Restart IV fluids (gentle and judicious)--patient is way too lethargic to drink or eat by himself at the moment.  3)HFrEF- -repeat echo from 08/13/2023 with EF of 50 to 55% without wall motion abnormalities, no diastolic dysfunction -BNP on admission is 48 -Chest x-ray on 08/15/2023 and repeat chest x-ray on 08/14/2023 without CHF concerns or other acute findings -Continue to hold Coreg, torsemide and Entresto for now due to soft BP and AKI  -May use IV metoprolol as needed tachycardia -Chest x-ray from 08/17/2023 without acute findings 08/19/23 Chest x-ray on 08/18/2023 for possible volume overload/pulmonary venous congestion -Cardiology consult appreciated -Continue holding diuretics at the moment. -Repeat x-ray demonstrating central vascular congestion.  Patchy areas of consolidation at the lung bases favoring atelectasis. -When able to participate mentally will recommend the use of incentive spirometer and flutter valve.  4)Delirium--suspect some component of DTs -Wife (retired Charity fundraiser) reports similar presentation back at [facility] in  2020 -Apparently prior to admission patient had cut way back on his alcohol usage -Hyponatremia and AKI could have been contributing to his delirium/mental status changes as well -Chest x-ray and Blood cultures Not suggestive of infection -Concerns  for possible cellulitis of the legs-currently on Abx as below in #5 -No  agitation currently appreciated; patient not requiring restraints -Will continue multivitamin, thiamine and folic acid when safe to take p.o.'s -Ammonia not elevated -Continue CIWA protocol -Currently off Precedex at the moment.  5)Lower extremity cellulitis--- please see photos in epic -Actually no significant pitting edema at this time--patient is actually dry with scaly skin -Lower extremity Dopplers without DVT -Echo and chest x-ray and BNP Not suggestive of acute CHF Patient completed iv  Rocephin -Stopped Flagyl WBC 11.0 >>10.8 >>7.7>.8.9>>8.2>>9.8 -Blood cultures from 09/03/2023 and repeat blood culture from 08/18/2023 NGTD -Continue to hold off on antibiotics currently.  6) hypokalemia/hypophosphatemia- -continue to follow electrolytes trend and further replete as needed.  7) tachycardia--episodes of atrial tachycardia noted -Serial EKGs noted -Cardiology consult appreciated -Following cardiology recommendations amiodarone drip has been initiated. -Continue as needed metoprolol as ordered  8) hypotension--- in the setting of tachycardia -Patient briefly required IV Levophed drip on 08/18/2023 -Weaned off Levophed at this time -Continue to follow vital signs. -Cardiology input and recommendations appreciated -Continue IV fluids as above  Coronary artery disease - Continue aspirin, beta-blocker and statin--when awake enough to take oral medications. - Holding Entresto due to soft pressures at presentation; also currently unable to safely take p.o.'s.   COPD exacerbation,--hold off on steroids given mental status concerns -Continue bronchodilators ABG reviewed, within normal range -Continue oxygen supplementation as needed.  Body mass index is 34.59 kg/m.  Interventions:   Leg wounds--- RT >> Lt  Wound / Incision (Open or Dehisced) 08/26/2023 Skin tear Foot Anterior;Left small skin tear,patient said its from scratching the abrasions (Active)  Date First Assessed/Time  First Assessed: 08/29/2023 2318   Wound Type: Skin tear  Location: Foot  Location Orientation: Anterior;Left  Wound Description (Comments): small skin tear,patient said its from scratching the abrasions  Present on Admission: Yes    Assessments 09/04/2023 11:19 PM 08/20/2023  8:00 AM  Dressing Type Foam - Lift dressing to assess site every shift Foam - Lift dressing to assess site every shift  Dressing Changed Changed --  Dressing Status Clean, Dry, Intact --  Dressing Change Frequency PRN --  Site / Wound Assessment Clean;Dry;Pink --  Peri-wound Assessment Intact --  Wound Length (cm) 1.5 cm --  Wound Width (cm) 1.5 cm --  Wound Surface Area (cm^2) 2.25 cm^2 --  Margins Unattached edges (unapproximated) --  Closure None --  Drainage Amount None --  Treatment Cleansed --     No associated orders.     Wound / Incision (Open or Dehisced) 08/29/2023 Other (Comment) Pretibial Right looks more like a cellulitis, redness associated with swelling in the entire right leg below the knees associated with active seeping of wound with serosanguinous fluid (Active)  Date First Assessed/Time First Assessed: 09/08/2023 2230   Wound Type: Other (Comment)  Location: Pretibial  Location Orientation: Right  Wound Description (Comments): looks more like a cellulitis, redness associated with swelling in the entire right le...    Assessments 08/13/2023  1:24 AM 08/19/2023  8:00 PM  Wound Image      Dressing Type Compression wrap Silver dressings;Tape dressing;Abdominal pads;Gauze (Comment)  Dressing Changed Changed --  Dressing Status Old drainage;Other (Comment) Clean, Dry, Intact  Dressing Change Frequency  PRN Daily  Site / Wound Assessment Pink;Red;Other (Comment) Dressing in place / Unable to assess  Peri-wound Assessment Erythema (non-blanchable);Pink;Maceration --  Drainage Amount Moderate Scant  Drainage Description Serosanguineous --  Non-staged Wound Description Partial thickness --  Treatment Cleansed --      No associated orders.     Wound / Incision (Open or Dehisced) 08/13/23 Non-pressure wound Buttocks Right;Left Stage 2 (Active)  Date First Assessed/Time First Assessed: 08/13/23 0924   Wound Type: Non-pressure wound  Location: Buttocks  Location Orientation: Right;Left  Wound Description (Comments): Stage 2  Present on Admission: Yes    Assessments 08/13/2023  8:45 AM 08/19/2023  8:00 PM  Dressing Type Foam - Lift dressing to assess site every shift Foam - Lift dressing to assess site every shift  Dressing Changed New --  Dressing Status Clean, Dry, Intact --  Dressing Change Frequency PRN --  Site / Wound Assessment Clean;Dry --  Peri-wound Assessment Pink --     No associated orders.   CRITICAL CARE Performed by: Vassie Loll   Total critical care time: 55 minutes  Critical care time was exclusive of separately billable procedures and treating other patients.  Critical care was necessary to treat or prevent imminent or life-threatening deterioration.  Critical care was time spent personally by me on the following activities: development of treatment plan with patient and/or surrogate as well as nursing, discussions with consultants, evaluation of patient's response to treatment, examination of patient, obtaining history from patient or surrogate, ordering and performing treatments and interventions, ordering and review of laboratory studies, ordering and review of radiographic studies, pulse oximetry and re-evaluation of patient's condition.  Diet: currently NPO with ongoing encephalopathy. DVT Prophylaxis: Subcutaneous Lovenox   Advance goals of care discussion: DNR/DNI--- discussed with wife who confirms DNR/DNI status  Family Communication: -Discussed with wife who is a retired Charity fundraiser ,questions answered -Updated patient's wife face-to-face at bedside on 08/20/2023 - wife Candace at [phone]-   Disposition: TBD---condition is currently guarded--critical -  Subjective:   Largely unresponsive; no eating, not drinking, unsafe to take oral medications.  Increased lethargy and somnolence appreciated on today's examination.  Patient no longer requiring Precedex.  Afebrile, no nausea vomiting.   Physical Exam: General exam: Lethargic/somnolent; blood in my examination with wife at bedside patient only able to open his eyes momentarily but was not following any commands or demonstrating ability to take by mouth medication or nutrition. Respiratory system: Decreased breath sounds at the bases; no wheezing. Cardiovascular system: Irregular irregular; no rubs or gallops. Gastrointestinal system: Abdomen is nondistended, soft and nontender.  Positive bowel sounds. Central nervous system: Moving 4 limbs spontaneously; demonstrating generalized weakness and deconditioning.  No focal deficits appreciated. Extremities: No cyanosis or clubbing.  2+ edema appreciated bilaterally extending up to his thighs.  Generalized rash/discoloration and scabs appreciated. Skin: No petechiae; right anterior lower extremity wound without active drainage and clean dressings in place.  Decreased pulses appreciated bilaterally.  Chronic stasis dermatitis appreciated bilaterally. Psychiatry: Judgement and insight appear impaired in the setting of current encephalopathic process.   Vitals:   08/20/23 1940 08/20/23 2000 08/20/23 2026 08/20/23 2100  BP:  (!) 142/78  124/69  Pulse:  90  92  Resp:  (!) 31  (!) 25  Temp: 99.2 F (37.3 C)     TempSrc: Axillary     SpO2:  96% 95% 96%  Weight:      Height:        Intake/Output Summary (Last 24 hours) at  08/20/2023 2238 Last data filed at 08/20/2023 1831 Gross per 24 hour  Intake 1009.18 ml  Output 1700 ml  Net -690.82 ml   Filed Weights   08/18/23 0403 08/19/23 0500 08/20/23 0500  Weight: 96.7 kg 96.5 kg 97.2 kg   CBC: Recent Labs  Lab 08/16/23 0449 08/17/23 0353 08/18/23 0535 08/19/23 0440 08/20/23 0455  WBC 8.9 8.1 8.2 9.8 11.2*   HGB 11.9* 11.3* 12.4* 12.5* 12.1*  HCT 34.6* 34.7* 37.6* 40.0 38.0*  MCV 96.9 100.3* 100.3* 103.4* 101.6*  PLT 305 287 284 256 206   Basic Metabolic Panel: Recent Labs  Lab 08/14/23 0324 08/15/23 0621 08/16/23 0449 08/17/23 0353 08/18/23 0535 08/19/23 0440 08/20/23 0455  NA 127* 134* 139  --  146* 151* 154*  K 3.2* 3.3* 3.4*  --  3.9 3.5 3.1*  CL 92* 99 105  --  111 115* 119*  CO2 22 25 23   --  20* 23 23  GLUCOSE 143* 104* 100*  --  122* 111* 121*  BUN 12 7* 5*  --  15 22 18   CREATININE 0.86 0.82 0.78  --  1.13 1.46* 1.38*  CALCIUM 8.8* 8.5* 8.4*  --  8.9 9.1 9.0  MG 1.8  --   --  1.7  --   --   --   PHOS 1.7* 2.4*  --   --  2.4*  --   --    Studies: DG CHEST PORT 1 VIEW  Result Date: 08/20/2023 CLINICAL DATA:  Dyspnea EXAM: PORTABLE CHEST 1 VIEW COMPARISON:  08/18/2023 FINDINGS: Single frontal view of the chest demonstrates a stable cardiac silhouette. Stable postsurgical changes from CABG. Persistent central vascular congestion. Areas of patchy consolidation at the lung bases are noted, which may reflect atelectasis or developing edema. No effusion or pneumothorax. No acute bony abnormality. IMPRESSION: 1. Continued central vascular congestion. Patchy areas of consolidation at the lung bases favor atelectasis over edema. Electronically Signed   By: Sharlet Salina M.D.   On: 08/20/2023 10:29     Scheduled Meds:  aspirin EC  81 mg Oral Daily   Chlorhexidine Gluconate Cloth  6 each Topical Daily   enoxaparin (LOVENOX) injection  40 mg Subcutaneous QPM   folic acid  1 mg Oral Daily   guaiFENesin  600 mg Oral BID   ipratropium  0.5 mg Nebulization TID   LORazepam  2 mg Intravenous Once   multivitamin with minerals  1 tablet Oral Daily   rosuvastatin  40 mg Oral QHS   thiamine  100 mg Oral Daily   Or   thiamine  100 mg Intravenous Daily   Continuous Infusions:  sodium chloride 125 mL/hr at 08/20/23 2116   amiodarone 30 mg/hr (08/20/23 1946)   dexmedetomidine (PRECEDEX)  IV infusion Stopped (08/18/23 1020)   norepinephrine (LEVOPHED) Adult infusion Stopped (08/18/23 1254)   PRN Meds: acetaminophen **OR** acetaminophen, fluticasone furoate-vilanterol, glycopyrrolate, ipratropium, LORazepam **OR** LORazepam, metoprolol tartrate, ondansetron **OR** ondansetron (ZOFRAN) IV, mouth rinse, oxyCODONE  Author: Vassie Loll, MD  [facility] 08/20/2023 10:38 PM  To reach On-call, see care teams to locate the attending and reach out to them via [email or web address]. If 7PM-7AM, please contact night-coverage If you still have difficulty reaching the attending provider, please page the [facility] (Director on Call) for [facility] on amion for assistance.

## 2023-08-20 NOTE — Progress Notes (Signed)
Palliative:   Mr. Tinnell is lying quietly in bed.  He appears acutely/chronically ill and frail.  He does not attempt to interact with me in any meaningful way.  He clearly cannot make his needs known.  There is no family at bedside at this time.  Face-to-face discussion with bedside nursing staff related to patient condition, needs  Call to wife, Remo Kirschenmann. We talk about acute health concerns and no meaningful recovery over last few days.  I share that Mr. Rzepka did not interact with me in any meaningful way this morning.  She said he was the same with her.  We talk about what meaningful improvement would look like for Mr. Mimbs.  We talk about cardiology consult and the treatment plan.  We talk about "next steps".  Candace states that, "my mind is in such a turmoil over it".  She shares that she knows her husband will want to return home.  She shares her concern about her ability to care for him at home.  She talks about hospice care.  We talked about at home hospice care and residential hospice care placement.  She asks appropriate questions.    Conference with attending, bedside nursing staff, transition of care team related to patient condition, needs, goals of care, disposition.  Plan: At this point continue to treat the treatable but no CPR or intubation.  Time for outcomes.  Considering direction of care.  Good discussions around hospice care  50 minutes  Lillia Carmel, NP Palliative Medicine Team  Team phone [phone]

## 2023-08-20 NOTE — Progress Notes (Signed)
Progress Note  Patient Name: David Mack Date of Encounter: 08/20/2023  Primary Cardiologist: Chrystie Nose, MD  Subjective   No acute events overnight, intermittent atrial arrhythmias, patient somnolent.  Inpatient Medications    Scheduled Meds:  aspirin EC  81 mg Oral Daily   Chlorhexidine Gluconate Cloth  6 each Topical Daily   enoxaparin (LOVENOX) injection  40 mg Subcutaneous QPM   folic acid  1 mg Oral Daily   guaiFENesin  600 mg Oral BID   ipratropium  0.5 mg Nebulization TID   LORazepam  2 mg Intravenous Once   multivitamin with minerals  1 tablet Oral Daily   rosuvastatin  40 mg Oral QHS   thiamine  100 mg Oral Daily   Or   thiamine  100 mg Intravenous Daily   Continuous Infusions:  sodium chloride 125 mL/hr at 08/20/23 0122   amiodarone 30 mg/hr (08/20/23 0806)   dexmedetomidine (PRECEDEX) IV infusion Stopped (08/18/23 1020)   norepinephrine (LEVOPHED) Adult infusion Stopped (08/18/23 1254)   potassium chloride 10 mEq (08/20/23 1000)   PRN Meds: acetaminophen **OR** acetaminophen, fluticasone furoate-vilanterol, glycopyrrolate, ipratropium, LORazepam **OR** LORazepam, metoprolol tartrate, ondansetron **OR** ondansetron (ZOFRAN) IV, mouth rinse, oxyCODONE   Vital Signs    Vitals:   08/20/23 0700 08/20/23 0718 08/20/23 0731 08/20/23 0800  BP: (!) 141/77  (!) 144/85 (!) 146/81  Pulse: 90  92 (!) 129  Resp: (!) 26  (!) 27 (!) 31  Temp:      TempSrc:      SpO2: 98% 98% 98% 98%  Weight:      Height:        Intake/Output Summary (Last 24 hours) at 08/20/2023 1007 Last data filed at 08/20/2023 0500 Gross per 24 hour  Intake 1813.81 ml  Output 1450 ml  Net 363.81 ml   Filed Weights   08/18/23 0403 08/19/23 0500 08/20/23 0500  Weight: 96.7 kg 96.5 kg 97.2 kg    Telemetry     Personally reviewed, atrial arrhythmias consistent with atrial tachycardia versus atypical atrial flutter, has a typical stepup pattern.  ECG    Not performed  today  Physical Exam   GEN: No acute distress.   Neck: No JVD. Cardiac: RRR, no murmur, rub, or gallop.  Respiratory: Nonlabored. Clear to auscultation bilaterally. GI: Soft, nontender, bowel sounds present. MS: No edema; No deformity. Neuro:  Nonfocal. Psych: Somnolent, minimally arousable.  Labs    Chemistry Recent Labs  Lab 08/18/23 0535 08/19/23 0440 08/20/23 0455  NA 146* 151* 154*  K 3.9 3.5 3.1*  CL 111 115* 119*  CO2 20* 23 23  GLUCOSE 122* 111* 121*  BUN 15 22 18   CREATININE 1.13 1.46* 1.38*  CALCIUM 8.9 9.1 9.0  PROT  --  6.9 6.7  ALBUMIN 3.2* 3.2* 2.9*  AST  --  151* 112*  ALT  --  74* 80*  ALKPHOS  --  68 63  BILITOT  --  0.7 0.8  GFRNONAA >60 50* 53*  ANIONGAP 15 13 12      Hematology Recent Labs  Lab 08/18/23 0535 08/19/23 0440 08/20/23 0455  WBC 8.2 9.8 11.2*  RBC 3.75* 3.87* 3.74*  HGB 12.4* 12.5* 12.1*  HCT 37.6* 40.0 38.0*  MCV 100.3* 103.4* 101.6*  MCH 33.1 32.3 32.4  MCHC 33.0 31.3 31.8  RDW 14.3 14.3 14.6  PLT 284 256 206    Cardiac Enzymes Recent Labs  Lab 08/14/2023 2119 08/18/23 1258 08/18/23 1450  TROPONINIHS 7 66* 58*  BNP No results for input(s): "BNP", "PROBNP" in the last 168 hours.    DDimerNo results for input(s): "DDIMER" in the last 168 hours.   Radiology    No results found.   Assessment & Plan   Atrial arrhythmias, paroxysmal Acute encephalopathy secondary to delirium tremens, off Precedex drip Right lower extremity cellulitis on antibiotics CAD status post CABG in 2020   -Telemetry reviewed, has typical stepup pattern consistent with atrial arrhythmias, possibly atypical atrial flutter versus atrial tachycardia.  Baseline EKG showed NSR, first-degree AV block.  Keep K > 4 and <5; Mg >2 and <3.  Continue IV amiodarone drip, treat underlying etiology, encourage p.o. hydration, nutrition.  Defer this to the primary team.  Continue cardioprotective medications with aspirin 81 mg once daily and  rosuvastatin 40 mg nightly.  On antibiotics for cellulitis management and delirium tremens management per primary team.   Signed, Marjo Bicker, MD  08/20/2023, 10:07 AM

## 2023-08-21 DIAGNOSIS — I4719 Other supraventricular tachycardia: Secondary | ICD-10-CM

## 2023-08-21 DIAGNOSIS — F10931 Alcohol use, unspecified with withdrawal delirium: Secondary | ICD-10-CM

## 2023-08-21 DIAGNOSIS — E66811 Obesity, class 1: Secondary | ICD-10-CM

## 2023-08-21 MED ORDER — SODIUM CHLORIDE 0.9 % IV BOLUS
500.0000 mL | Freq: Once | INTRAVENOUS | Status: AC
  Administered 2023-08-21: 500 mL via INTRAVENOUS

## 2023-08-23 LAB — CULTURE, BLOOD (ROUTINE X 2)
Culture: NO GROWTH
Culture: NO GROWTH
Special Requests: ADEQUATE

## 2023-09-12 NOTE — Death Summary Note (Signed)
DEATH SUMMARY   Patient Details  Name: David Mack MRN: [id number] DOB: 11-Jul-1946 WFU:[id number], David Gianotti, MD Admission/Discharge Information   Admit Date:  09-09-23  Date of Death: Date of Death: 09/18/2023  Time of Death: Time of Death: 0356  Length of Stay: 9   Principle Cause of death: PEA arrest, acute kidney injury, atrial tachycardia and electrolytes disturbances.  Hospital Diagnoses: Principal Problem:   AKI (acute kidney injury) (HCC) Active Problems:   Hyponatremia   Coronary artery disease   Lower extremity edema   Chronic systolic CHF (congestive heart failure) (HCC)   Alcohol use   Hypokalemia   Brief Hospital Course: Brief hospital course: [name] is a 77 y.o. male with medical history significant of COPD, alcohol use, hypertension, CHF, and more presents the ED with a chief complaint of fluid overload.  Patient reports that he has been retaining fluid in his right leg and his right leg has been weeping.  He reports has been retaining fluid in that leg for a long time but it got worse over the last 2 weeks.  He denies any pain or numbness in that leg.  There is associated erythema in that leg.  Patient is not sure when that started.  When the leg drains, it is clear fluid, no purulence.  He has dyspnea but it is no worse than his normal dyspnea.  He does not wear oxygen at home.  He has not had any chest pain, palpitations.  He denies orthopnea.  He reports compliance with his torsemide.  Patient was seen at an outpatient clinic and sent to the ER for cellulitis/CHF workup. - Patient does not smoke.  He used to drink up to a sixpack of beer per day, was drinking much less just prior to admission  Despite over 8 days of acute treatment with patient experience fluid resuscitation and electrolytes repletion; he also developed complaining of delirium/DTs and require temporarily the use of Precedex.  Patient also have acute atrial tachycardia with requirement of  amiodarone drip.  He was somnolent/obtunded over the last 48 hours of this hospitalization not able to follow commands, eat, drink or take any oral medication.  After discussion regarding goals of care decision was made to respect previous wishes of no resuscitation and we wear in the process of examining his candidacy for artificial nutrition.  While discussing with his wife (Retired Solicitor), she was no clear of wanting to pursued artificial/invasive treatments as he has had significant decline over the last couple of months.  On 09-18-23 around 3:30 in the morning patient experience increased shortness of breath, bradycardia and hypotension; despite interventions to stabilize his vital signs (IV fluid boluses and pressors support), patient went into PEA and expire around 3:56 AM.  Patient's wife was contacted and informed.  Arrangement for patient to be transfer to the morgue and later to designated funeral home were discussed/made.    Assessment and detailed Plan of care by problems while hospitalized: * AKI (acute kidney injury) (HCC) - The most recent creatinine we have is from 2020 but was 0.87, today creatinine is 2.21 - After 2 L of fluid creatinine has improved to 1.65 - Patient also has a lactic acidosis 3.6, 2.4 - This is indicative of hypovolemic state - Continue IV fluids - Avoid nephrotoxic agents when possible - Trend creatinine in the a.m.  Hypokalemia - Trending up from 2.5 - Recheck on a.m. labs - Prolonged QTc - Monitor on telemetry  Alcohol  use - Drinks up to a sixpack per day - CIWA protocol  Chronic systolic CHF (congestive heart failure) (HCC) - Continue aspirin and statin, beta-blocker holding Entresto due to soft pressures at presentation  Lower extremity edema - Sent from outpatient clinic for CHF workup - Chest x-ray shows cardiomegaly, but no pulmonary edema, patient denies orthopnea, patient denies worsening shortness of breath, asymmetrical lower  extremity edema, BNP normal - There was concern for cellulitis so patient was treated with Rocephin and Flagyl, but procalcitonin is undetectable, mild leukocytosis at 11.0 - Continue antibiotics until blood cultures result - Unilateral edema right greater than left-check ultrasound DVT - Continue to monitor  Coronary artery disease - Continue aspirin, beta-blocker and statin - Holding Entresto due to soft pressures at presentation  Hyponatremia - Improved from 120>> 122 with 2 L of fluid - Continue hydration - Trend in the a.m.  Class I obesity -Body mass index is 34.59 kg/m. -Low-calorie and portion control discussed with patient wife at bedside.  Procedures: See below for x-ray reports.  Consultations: Cardiology service; palliative care.  The results of significant diagnostics from this hospitalization (including imaging, microbiology, ancillary and laboratory) are listed below for reference.   Significant Diagnostic Studies: DG CHEST PORT 1 VIEW  Result Date: 08/20/2023 CLINICAL DATA:  Dyspnea EXAM: PORTABLE CHEST 1 VIEW COMPARISON:  08/18/2023 FINDINGS: Single frontal view of the chest demonstrates a stable cardiac silhouette. Stable postsurgical changes from CABG. Persistent central vascular congestion. Areas of patchy consolidation at the lung bases are noted, which may reflect atelectasis or developing edema. No effusion or pneumothorax. No acute bony abnormality. IMPRESSION: 1. Continued central vascular congestion. Patchy areas of consolidation at the lung bases favor atelectasis over edema. Electronically Signed   By: Sharlet Salina M.D.   On: 08/20/2023 10:29   DG CHEST PORT 1 VIEW  Result Date: 08/18/2023 CLINICAL DATA:  Dyspnea. History of CHF, hypertension, alcohol usage and COPD. EXAM: PORTABLE CHEST 1 VIEW COMPARISON:  08/17/2023 FINDINGS: Previous median sternotomy and CABG procedure. Stable cardiomediastinal contours. New bilateral pleural effusions identified  with pulmonary vascular congestion. Atelectasis noted within both lung bases. IMPRESSION: 1. New bilateral pleural effusions and pulmonary vascular congestion. Correlate for any signs or symptoms of congestive heart failure. 2. Bibasilar atelectasis. Electronically Signed   By: Signa Kell M.D.   On: 08/18/2023 12:38   DG CHEST PORT 1 VIEW  Result Date: 08/17/2023 CLINICAL DATA:  77 year old male with history of dyspnea. EXAM: PORTABLE CHEST 1 VIEW COMPARISON:  Chest x-ray 08/14/2023. FINDINGS: Lung volumes are low. No consolidative airspace disease. Mild diffuse interstitial prominence and peribronchial cuffing appears chronic and similar to prior studies. No pleural effusions. No pneumothorax. No pulmonary nodule or mass noted. Pulmonary vasculature and the cardiomediastinal silhouette are within normal limits. Atherosclerotic calcifications are noted in the thoracic aorta. Status post median sternotomy for CABG. IMPRESSION: 1. Low lung volumes without radiographic evidence of acute cardiopulmonary disease. 2. Aortic atherosclerosis. Electronically Signed   By: Trudie Reed M.D.   On: 08/17/2023 14:01   DG CHEST PORT 1 VIEW  Result Date: 08/14/2023 CLINICAL DATA:  Dyspnea. EXAM: PORTABLE CHEST 1 VIEW COMPARISON:  August 12, 2023. FINDINGS: Status post coronary artery bypass graft. Stable cardiomediastinal silhouette. Right lung is clear. Minimal left lingular subsegmental atelectasis is noted. Bony thorax is unremarkable. IMPRESSION: Minimal left lingular subsegmental atelectasis. Electronically Signed   By: Lupita Raider M.D.   On: 08/14/2023 16:17   ECHOCARDIOGRAM COMPLETE  Result Date: 08/13/2023  ECHOCARDIOGRAM REPORT   Patient Name:   SAMRIDH NABA Date of Exam: 08/13/2023 Medical Rec #:  [id number]      Height:       66.0 in Accession #:    [id number]     Weight:       212.7 lb Date of Birth:  10/04/1946       BSA:          2.053 m Patient Age:    77 years       BP:           143/114  mmHg Patient Gender: M              HR:           113 bpm. [facility] Procedure: 2D Echo, Cardiac Doppler and Color Doppler Indications:    CHF l50.21  History:        Patient has prior history of Echocardiogram examinations, most                 recent 05/21/2019. CHF, CAD, Prior CABG, COPD; Risk                 Factors:Hypertension.  Sonographer:    BW Referring Phys: [id number] Gillis Santa IMPRESSIONS  1. Left ventricular ejection fraction, by estimation, is 50 to 55%. The left ventricle has low normal function. The left ventricle has no regional wall motion abnormalities. There is mild concentric left ventricular hypertrophy. Left ventricular diastolic parameters were normal.  2. Right ventricular systolic function is mildly reduced. The right ventricular size is mildly enlarged. There is normal pulmonary artery systolic pressure. The estimated right ventricular systolic pressure is 24.6 mmHg.  3. Left atrial size was moderately dilated.  4. Right atrial size was severely dilated.  5. The mitral valve is degenerative. Mild mitral valve regurgitation.  6. The aortic valve is tricuspid. There is mild calcification of the aortic valve. Aortic valve regurgitation is not visualized. Aortic valve sclerosis/calcification is present, without any evidence of aortic stenosis.  7. The inferior vena cava is dilated in size with >50% respiratory variability, suggesting right atrial pressure of 8 mmHg. Comparison(s): Prior images reviewed side by side. LVEF 50-55% range. Mild RV dysfunction with normal estimated RVSP. FINDINGS  Left Ventricle: Left ventricular ejection fraction, by estimation, is 50 to 55%. The left ventricle has low normal function. The left ventricle has no regional wall motion abnormalities. The left ventricular internal cavity size was normal in size. There is mild concentric left ventricular hypertrophy. Left ventricular diastolic parameters were normal. Right Ventricle: The right  ventricular size is mildly enlarged. No increase in right ventricular wall thickness. Right ventricular systolic function is mildly reduced. There is normal pulmonary artery systolic pressure. The tricuspid regurgitant velocity  is 2.04 m/s, and with an assumed right atrial pressure of 8 mmHg, the estimated right ventricular systolic pressure is 24.6 mmHg. Left Atrium: Left atrial size was moderately dilated. Right Atrium: Right atrial size was severely dilated. Pericardium: There is no evidence of pericardial effusion. Presence of epicardial fat layer. Mitral Valve: The mitral valve is degenerative in appearance. Mild to moderate mitral annular calcification. Mild mitral valve regurgitation. Tricuspid Valve: The tricuspid valve is grossly normal. Tricuspid valve regurgitation is mild. Aortic Valve: The aortic valve is tricuspid. There is mild calcification of the aortic valve. There is mild to moderate aortic valve annular calcification. Aortic valve regurgitation is not visualized. Aortic valve sclerosis/calcification is present, without any  evidence of aortic stenosis. Pulmonic Valve: The pulmonic valve was grossly normal. Pulmonic valve regurgitation is trivial. Aorta: The aortic root is normal in size and structure. Venous: The inferior vena cava is dilated in size with greater than 50% respiratory variability, suggesting right atrial pressure of 8 mmHg. IAS/Shunts: No atrial level shunt detected by color flow Doppler.  LEFT VENTRICLE PLAX 2D LVIDd:         4.80 cm      Diastology LVIDs:         3.40 cm      LV e' medial:    7.29 cm/s LV PW:         1.10 cm      LV E/e' medial:  13.7 LV IVS:        0.90 cm      LV e' lateral:   8.27 cm/s LVOT diam:     2.10 cm      LV E/e' lateral: 12.1 LV SV:         71 LV SV Index:   35 LVOT Area:     3.46 cm  LV Volumes (MOD) LV vol d, MOD A2C: 99.8 ml LV vol d, MOD A4C: 128.0 ml LV vol s, MOD A2C: 56.3 ml LV vol s, MOD A4C: 57.4 ml LV SV MOD A2C:     43.5 ml LV SV MOD A4C:      128.0 ml LV SV MOD BP:      58.8 ml RIGHT VENTRICLE RV S prime:     8.38 cm/s TAPSE (M-mode): 1.8 cm LEFT ATRIUM              Index        RIGHT ATRIUM           Index LA diam:        4.10 cm  2.00 cm/m   RA Area:     31.20 cm LA Vol (A2C):   123.0 ml 59.91 ml/m  RA Volume:   116.00 ml 56.50 ml/m LA Vol (A4C):   67.6 ml  32.93 ml/m LA Biplane Vol: 94.9 ml  46.23 ml/m  AORTIC VALVE LVOT Vmax:   74.40 cm/s LVOT Vmean:  47.200 cm/s LVOT VTI:    0.205 m  AORTA Ao Root diam: 3.10 cm MITRAL VALVE                TRICUSPID VALVE MV Area (PHT): 5.13 cm     TR Peak grad:   16.6 mmHg MV Decel Time: 148 msec     TR Vmax:        204.00 cm/s MV E velocity: 100.00 cm/s MV A velocity: 90.00 cm/s   SHUNTS MV E/A ratio:  1.11         Systemic VTI:  0.20 m                             Systemic Diam: 2.10 cm Nona Dell MD Electronically signed by Nona Dell MD Signature Date/Time: 08/13/2023/4:36:07 PM    Final    CT HEAD WO CONTRAST ( )  Result Date: 08/13/2023 CLINICAL DATA:  Mental status change of unknown cause EXAM: CT HEAD WITHOUT CONTRAST TECHNIQUE: Contiguous axial images were obtained from the base of the skull through the vertex without intravenous contrast. RADIATION DOSE REDUCTION: This exam was performed according to the departmental dose-optimization program which includes automated exposure control, adjustment of the mA and/or kV according to  patient size and/or use of iterative reconstruction technique. COMPARISON:  None Available. FINDINGS: Brain: Generalized atrophy. Chronic small-vessel ischemic changes of the white matter. No sign of acute infarction, mass lesion, hemorrhage, hydrocephalus or extra-axial collection. Vascular: There is atherosclerotic calcification of the major vessels at the base of the brain. Skull: Negative Sinuses/Orbits: Clear/normal Other: None IMPRESSION: No acute finding by CT. Atrophy and chronic small-vessel ischemic changes of the white matter. Electronically  Signed   By: Paulina Fusi M.D.   On: 08/13/2023 14:16   US Venous Img Lower Bilateral (DVT)  Result Date: 08/13/2023 CLINICAL DATA:  Cellulitis of lower extremities. EXAM: BILATERAL LOWER EXTREMITY VENOUS DOPPLER ULTRASOUND TECHNIQUE: Gray-scale sonography with graded compression, as well as color Doppler and duplex ultrasound were performed to evaluate the lower extremity deep venous systems from the level of the common femoral vein and including the common femoral, femoral, profunda femoral, popliteal and calf veins including the posterior tibial, peroneal and gastrocnemius veins when visible. The superficial great saphenous vein was also interrogated. Spectral Doppler was utilized to evaluate flow at rest and with distal augmentation maneuvers in the common femoral, femoral and popliteal veins. COMPARISON:  None Available. FINDINGS: RIGHT LOWER EXTREMITY Common Femoral Vein: No evidence of thrombus. Normal compressibility, respiratory phasicity and response to augmentation. Saphenofemoral Junction: No evidence of thrombus. Normal compressibility and flow on color Doppler imaging. Profunda Femoral Vein: No evidence of thrombus. Normal compressibility and flow on color Doppler imaging. Femoral Vein: No evidence of thrombus. Normal compressibility, respiratory phasicity and response to augmentation. Popliteal Vein: No evidence of thrombus. Normal compressibility, respiratory phasicity and response to augmentation. Calf Veins: No evidence of thrombus. Normal compressibility and flow on color Doppler imaging. Superficial Great Saphenous Vein: No evidence of thrombus. Normal compressibility. Venous Reflux:  None. Other Findings:  None. LEFT LOWER EXTREMITY Common Femoral Vein: No evidence of thrombus. Normal compressibility, respiratory phasicity and response to augmentation. Saphenofemoral Junction: No evidence of thrombus. Normal compressibility and flow on color Doppler imaging. Profunda Femoral Vein: No  evidence of thrombus. Normal compressibility and flow on color Doppler imaging. Femoral Vein: No evidence of thrombus. Normal compressibility, respiratory phasicity and response to augmentation. Popliteal Vein: No evidence of thrombus. Normal compressibility, respiratory phasicity and response to augmentation. Calf Veins: No evidence of thrombus. Normal compressibility and flow on color Doppler imaging. Superficial Great Saphenous Vein: No evidence of thrombus. Normal compressibility. Venous Reflux:  None. Other Findings:  None. IMPRESSION: No evidence of deep venous thrombosis in either lower extremity. Electronically Signed   By: Lupita Raider M.D.   On: 08/13/2023 13:46   DG Chest Port 1 View  Result Date: 08/20/2023 CLINICAL DATA:  CHF. Bilateral leg swelling. Cellulitis. Sepsis protocol initiated. EXAM: PORTABLE CHEST 1 VIEW COMPARISON:  Chest radiographs 12/23/2018 and 12/14/2018; CT chest 11/10/2018 FINDINGS: Status post median sternotomy and CABG. Moderate atherosclerotic calcifications within the aortic arch. Cardiac silhouette is mildly to moderately enlarged, similar to prior. Mediastinal contours are within limits. There is again mild elevation of left hemidiaphragm. Left basilar horizontal linear scarring is unchanged from multiple prior radiographs and prior CT. No acute airspace opacity. No pneumothorax. Moderate multilevel degenerative disc changes of the thoracic spine. IMPRESSION: 1. Unchanged mild left basilar scarring and mild elevation of left hemidiaphragm. No acute cardiopulmonary process. 2. Unchanged mild to moderate cardiomegaly. Electronically Signed   By: Neita Garnet M.D.   On: 09/08/2023 16:54    Microbiology: Recent Results (from the past 240 hour(s))  Culture, blood (Routine x  2)     Status: None   Collection Time: 08/29/2023  3:31 PM   Specimen: Right Antecubital; Blood  Result Value Ref Range Status   Specimen Description RIGHT ANTECUBITAL  Final   Special Requests    Final    BOTTLES DRAWN AEROBIC AND ANAEROBIC Blood Culture results may not be optimal due to an excessive volume of blood received in culture bottles   Culture   Final    NO GROWTH 5 DAYS Performed at [facility], [address] [place], [place] [address]    Report Status 08/17/2023 FINAL  Final  Culture, blood (Routine x 2)     Status: None   Collection Time: 08/17/2023  3:31 PM   Specimen: Left Antecubital; Blood  Result Value Ref Range Status   Specimen Description LEFT ANTECUBITAL  Final   Special Requests   Final    BOTTLES DRAWN AEROBIC AND ANAEROBIC Blood Culture adequate volume   Culture   Final    NO GROWTH 5 DAYS Performed at [facility], [address], [place], [place] [address]    Report Status 08/17/2023 FINAL  Final  MRSA Next Gen by PCR, Nasal     Status: None   Collection Time: 08/19/2023 10:17 PM   Specimen: Nasal Mucosa; Nasal Swab  Result Value Ref Range Status   MRSA by PCR Next Gen NOT DETECTED NOT DETECTED Final    Comment: (NOTE) The GeneXpert MRSA Assay (FDA approved for NASAL specimens only), is one component of a comprehensive MRSA colonization surveillance program. It is not intended to diagnose MRSA infection nor to guide or monitor treatment for MRSA infections. Test performance is not FDA approved in patients less than 31 years old. Performed at [facility], [address], [place], [place] [address]   Culture, blood (Routine X 2) w Reflex to ID Panel     Status: None (Preliminary result)   Collection Time: 08/18/23 10:32 AM   Specimen: BLOOD  Result Value Ref Range Status   Specimen Description BLOOD BLOOD RIGHT HAND  Final   Special Requests   Final    BOTTLES DRAWN AEROBIC AND ANAEROBIC Blood Culture results may not be optimal due to an excessive volume of blood received in culture bottles   Culture   Final    NO GROWTH 3 DAYS Performed at [facility], [address], [place], [place] [address]    Report Status PENDING  Incomplete  Culture,  blood (Routine X 2) w Reflex to ID Panel     Status: None (Preliminary result)   Collection Time: 08/18/23 10:32 AM   Specimen: BLOOD  Result Value Ref Range Status   Specimen Description BLOOD BLOOD LEFT HAND  Final   Special Requests   Final    BOTTLES DRAWN AEROBIC AND ANAEROBIC Blood Culture adequate volume   Culture   Final    NO GROWTH 3 DAYS Performed at [facility], [address], [place], [place] [address]    Report Status PENDING  Incomplete    Time spent: 35 minutes  Signed: Vassie Loll, MD 2023-08-30

## 2023-09-12 NOTE — Accreditation Note (Signed)
Restraint death within 24 hours of soft bilateral wrist restraints logged by Mariela Rex RNBSN 08/13/2023 at 1211 PM.

## 2023-09-12 NOTE — Progress Notes (Signed)
Upon reassessment, pt found to be hypotensive with a new onset of bradycardia, with labored breathing noted. Pt unresponsive to painful stimuli, pupils unreactive. Pt is DNR/DNI. Amiodarone infusion stopped.  Dr. Carren Rang notified of significant decline in patient status, corresponding orders put in place.   David Mack, the patient's wife returned phone call and updated on patient's status.   Pt presenting with agonal breathing, heart rate and blood pressure continuing to decrease.  Despite efforts to stabilize patient, deterioration continued. Time of death called at 3:56 am, with primary RN, charge RN, and two additional RNs at the bedside.   Spouse updated on patient status, will arrive to hospital this AM, before patient is taken to morgue. Family discussion regarding next steps and support were initiated.   Post-mortem checklist completed, with funeral home arrangements to be decided by spouse, David Mack. [facility] called with referral number in chart.

## 2023-09-12 NOTE — Progress Notes (Signed)
-   RN notified me that patient blood pressure was decreasing as well as heart rate.  She also noted that he was not responding, but on chart review it looks like it was the same way this afternoon.  We did a bolus reordered his pressors, and ordered an ABG.  Patient reached asystole presumably before some of these interventions had time to be started.  Called time of death at 3:56 AM.  RN tried to notify wife of declining status, she called 4 times but there was no answer.

## 2023-09-12 NOTE — Progress Notes (Addendum)
0740 AM Family at bedside. Wife requested [facility]. Documented in chart.   Medical examiner, Keenan Bachelor, contacted. He stated pt was not a medical examiner case. Documented.   Post mortem care completed. Patient's ring left behind by wife by accident. Ring will be placed in a pt labeled  bag.   Security called to pick up pt's body.  Patient placement called that pt transported.

## 2023-09-12 DEATH — deceased
